# Patient Record
Sex: Male | Born: 1955 | Race: Black or African American | Hispanic: No | Smoking: Former smoker
Health system: Southern US, Community
[De-identification: ages and names within clinical notes are randomized; demographics above are authoritative.]

## PROBLEM LIST (undated history)

## (undated) DIAGNOSIS — G909 Disorder of the autonomic nervous system, unspecified: Secondary | ICD-10-CM

## (undated) DIAGNOSIS — R809 Proteinuria, unspecified: Secondary | ICD-10-CM

## (undated) DIAGNOSIS — E663 Overweight: Secondary | ICD-10-CM

## (undated) DIAGNOSIS — E119 Type 2 diabetes mellitus without complications: Secondary | ICD-10-CM

## (undated) DIAGNOSIS — N529 Male erectile dysfunction, unspecified: Secondary | ICD-10-CM

## (undated) DIAGNOSIS — C61 Malignant neoplasm of prostate: Secondary | ICD-10-CM

## (undated) DIAGNOSIS — D869 Sarcoidosis, unspecified: Secondary | ICD-10-CM

## (undated) DIAGNOSIS — R972 Elevated prostate specific antigen [PSA]: Secondary | ICD-10-CM

## (undated) DIAGNOSIS — R059 Cough, unspecified: Secondary | ICD-10-CM

## (undated) DIAGNOSIS — J040 Acute laryngitis: Secondary | ICD-10-CM

## (undated) DIAGNOSIS — G4730 Sleep apnea, unspecified: Secondary | ICD-10-CM

## (undated) DIAGNOSIS — L219 Seborrheic dermatitis, unspecified: Secondary | ICD-10-CM

## (undated) DIAGNOSIS — E559 Vitamin D deficiency, unspecified: Secondary | ICD-10-CM

## (undated) DIAGNOSIS — H0019 Chalazion unspecified eye, unspecified eyelid: Secondary | ICD-10-CM

## (undated) DIAGNOSIS — I872 Venous insufficiency (chronic) (peripheral): Secondary | ICD-10-CM

## (undated) DIAGNOSIS — B353 Tinea pedis: Secondary | ICD-10-CM

## (undated) DIAGNOSIS — I10 Essential (primary) hypertension: Secondary | ICD-10-CM

## (undated) DIAGNOSIS — J309 Allergic rhinitis, unspecified: Secondary | ICD-10-CM

## (undated) DIAGNOSIS — E785 Hyperlipidemia, unspecified: Secondary | ICD-10-CM

## (undated) DIAGNOSIS — R05 Cough: Secondary | ICD-10-CM

## (undated) HISTORY — DX: Allergic rhinitis, unspecified: J30.9

## (undated) HISTORY — DX: Seborrheic dermatitis, unspecified: L21.9

## (undated) HISTORY — PX: WISDOM TOOTH EXTRACTION: SHX21

## (undated) HISTORY — DX: Sarcoidosis, unspecified: D86.9

## (undated) HISTORY — DX: Sleep apnea, unspecified: G47.30

## (undated) HISTORY — DX: Acute laryngitis: J04.0

## (undated) HISTORY — DX: Venous insufficiency (chronic) (peripheral): I87.2

## (undated) HISTORY — DX: Type 2 diabetes mellitus without complications: E11.9

## (undated) HISTORY — PX: CATARACT EXTRACTION: SUR2

## (undated) HISTORY — PX: ROTATOR CUFF REPAIR: SHX139

## (undated) HISTORY — PX: CIRCUMCISION: SUR203

## (undated) HISTORY — DX: Malignant neoplasm of prostate: C61

## (undated) HISTORY — DX: Male erectile dysfunction, unspecified: N52.9

## (undated) HISTORY — DX: Vitamin D deficiency, unspecified: E55.9

## (undated) HISTORY — DX: Cough: R05

## (undated) HISTORY — PX: TONSILLECTOMY AND ADENOIDECTOMY: SUR1326

## (undated) HISTORY — DX: Disorder of the autonomic nervous system, unspecified: G90.9

## (undated) HISTORY — DX: Tinea pedis: B35.3

## (undated) HISTORY — DX: Hyperlipidemia, unspecified: E78.5

## (undated) HISTORY — DX: Elevated prostate specific antigen (PSA): R97.20

## (undated) HISTORY — DX: Cough, unspecified: R05.9

## (undated) HISTORY — DX: Proteinuria, unspecified: R80.9

## (undated) HISTORY — DX: Overweight: E66.3

## (undated) HISTORY — DX: Chalazion unspecified eye, unspecified eyelid: H00.19

## (undated) HISTORY — DX: Essential (primary) hypertension: I10

---
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
## 2007-03-08 ENCOUNTER — Inpatient Hospital Stay (HOSPITAL_COMMUNITY): Admission: EM | Admit: 2007-03-08 | Discharge: 2007-03-10 | Payer: Self-pay | Admitting: Emergency Medicine

## 2007-04-20 ENCOUNTER — Encounter: Admission: RE | Admit: 2007-04-20 | Discharge: 2007-04-20 | Payer: Self-pay | Admitting: Internal Medicine

## 2008-03-13 DIAGNOSIS — G4730 Sleep apnea, unspecified: Secondary | ICD-10-CM | POA: Insufficient documentation

## 2008-03-13 DIAGNOSIS — G4733 Obstructive sleep apnea (adult) (pediatric): Secondary | ICD-10-CM | POA: Insufficient documentation

## 2008-03-13 DIAGNOSIS — I872 Venous insufficiency (chronic) (peripheral): Secondary | ICD-10-CM | POA: Insufficient documentation

## 2008-07-03 ENCOUNTER — Emergency Department (HOSPITAL_COMMUNITY): Admission: EM | Admit: 2008-07-03 | Discharge: 2008-07-03 | Payer: Self-pay | Admitting: Emergency Medicine

## 2009-11-27 ENCOUNTER — Ambulatory Visit: Payer: Self-pay | Admitting: Family Medicine

## 2011-03-29 NOTE — Discharge Summary (Signed)
Johnny Maddox, Johnny Maddox                  ACCOUNT NO.:  [id number]   MEDICAL RECORD NO.:  [id number]          PATIENT TYPE:  INP   LOCATION:  1225                         FACILITY:  [facility]   PHYSICIAN:  Lonia Blood, M.D.      DATE OF BIRTH:  08-05-1956   DATE OF ADMISSION:  03/08/2007  DATE OF DISCHARGE:  03/10/2007                               DISCHARGE SUMMARY   PRIMARY CARE PHYSICIAN:  The patient was unassigned to [place] but he will  follow up with Dr. Mikeal Hawthorne in a week.   DISCHARGE DIAGNOSES:  1. Newly diagnosed diabetes with hyperosmolar nonketotic state.  2. Hypertension.  3. Dyslipidemia.  4. Transient thrombocytopenia.  5. Obesity.   DISCHARGE MEDICATIONS:  1. Norvasc 10 mg daily.  2. Allegra 180 mg daily.  3. Geritol one tablet daily.  4. Hydrochlorothiazide 25 mg daily.  5. Lipitor 2 mg daily.  6. Amaryl 4 mg daily.  7. Lentus insulin 24 units subcutaneously q.h.s.   DISPOSITION:  The patient is being discharged home to follow up with Dr.  Mikeal Hawthorne.  Further treatment and management will be given in the office.  He will be set up with outpatient diabetic management clinic and  training.   PROCEDURES PERFORMED:  None.   CONSULTATIONS:  None.   BRIEF HISTORY AND PHYSICAL:  Please refer to dictated history and  physical by Dr. Della Goo.  In short however, this is 55 year old  male that presented to the emergency room complaining of being sick for  2 weeks.  He was having dry mouth, thirst, increased urination and  frequency.  He has also has lost some weight but with increasing  appetite.  The patient was seen in the ER to be dehydrated.  His blood  sugar was elevated to at 717.  Urinalysis showed glycosuria as well.  He  was subsequently admitted for further management.   HOSPITAL COURSE:  #1 - NEWLY-DIAGNOSED DIABETES.  The patient had no  prior history of diabetes until this admission.  With a sugar of over  700, he was admitted to the ICU and diagnosed with  hyperosmolar  nonketotic state.  He was hydrated mass with lots of IV fluids and IV  insulin.  Later, after the Glucommander was discontinued, he was placed  on Lantus insulin 24 units at night with Amaryl during the day.  The  patient's blood sugar seems to have stabilized and will see him as an  outpatient and adjust his sugars accordingly.   #2 - HYPERTENSION.  This was also controlled with his home medications  which we continued in the hospital.   #3 - DYSLIPIDEMIA.  His fasting lipid panel in the hospital was  minimally elevated.  We will continue with his stain while in the  hospital.   #4 - THROMBOCYTOPENIA.  The patient's platelet dropped as low as 116.  The screen for HIT was negative.  His platelet later bounced back and  appears to be secondary to acute illness.   #5 - MORBID OBESITY.  The patient was counseled again during this  hospitalization.   #  6 - DEHYDRATION.  Again, this was secondary to his hyperglycemia which  was corrected promptly with IV fluids.      Lonia Blood, M.D.  Electronically Signed     LG/MEDQ  D:  03/23/2007  T:  03/23/2007  Job:  [id number]

## 2011-03-29 NOTE — H&P (Signed)
Johnny Maddox, Johnny Maddox                  ACCOUNT NO.:  [id number]   MEDICAL RECORD NO.:  [id number]          PATIENT TYPE:  INP   LOCATION:  0102                         FACILITY:  [facility]   PHYSICIAN:  Della Goo, M.D. DATE OF BIRTH:  21-Jun-1956   DATE OF ADMISSION:  03/08/2007  DATE OF DISCHARGE:                              HISTORY & PHYSICAL   This is an unassigned patient.   CHIEF COMPLAINT:  Sickness x2 weeks.   HISTORY OF PRESENT ILLNESS:  This is a 55 year old male who presented to  the emergency department secondary to complaints of being sick for 2  weeks, with symptoms of dry mouth, thirst, increased urination, and  frequency.  The patient reports losing weight but having an increased  appetite as well.  He reports having a 20-pound weight loss over the  past 2 weeks.  He denies having any fevers, chills, shortness of breath,  chest pain, nausea, or vomiting.  He does report having right upper  quadrant abdominal pain at this time.   PAST MEDICAL HISTORY:  1. Hyperlipidemia.  2. Hypertension.  3. Allergies.   MEDICATIONS:  1. Lipitor 10 mg 1 p.o. daily.  2. Hydrochlorothiazide 25 mg 1 p.o. daily.  3. Fexofenadine 180 mg, 1 p.o. daily.  4. Amlodipine 10 mg 1 p.o. daily.   PAST SURGICAL HISTORY:  Rotator cuff repair of the left shoulder.   ALLERGIES:  NO KNOWN DRUG ALLERGIES.   SOCIAL HISTORY:  The patient works as a Careers information officer. He is a nonsmoker,  and reports rare alcohol usage.   FAMILY HISTORY:  Mother has diabetes. Strong family history of  hypertension, and no history of coronary artery disease in his family  that he knows of. The patient does report having a brother with lung  cancer who was a smoker.   REVIEW OF SYSTEMS:  Pertinents are mentioned above.   PHYSICAL EXAMINATION FINDINGS:  GENERAL:  This is in a morbidly obese 31-  year-old male in discomfort but no acute distress.  VITAL SIGNS: Temperature 98.6, blood pressure 132/86, heart rate 86,  respirations 16, O2 saturations 96%.  HEENT: Normocephalic, atraumatic.  There is no scleral icterus.  Pupils  are equally round, reactive to light.  Extraocular muscles are intact.  Funduscopic benign. Oropharynx:  Dry oral mucosa.  NECK:  Supple.  Full range of motion.  No thyromegaly, adenopathy, or  jugular venous distension.  CARDIOVASCULAR:  Regular rate and rhythm.  LUNGS:  Clear to auscultation bilaterally.  ABDOMEN:  Positive bowel sounds, soft, nontender, nondistended.  EXTREMITIES: Without cyanosis, clubbing, or edema.  GENITOURINARY:  Positive balanitis present. Uncircumcised male.  Normal  male genitalia.  NEUROLOGIC:  Alert and oriented x3.  Nonfocal.   LABORATORY STUDIES:  White blood cell count 6.3, hemoglobin 13.9,  hematocrit 41.6, platelets 147, neutrophils 70, lymphocytes 23, MCV  82.4, neutrophils 70%, lymphocytes 23%.  Sodium 132, potassium 4.2,  chloride 98, CO2 23, BUN 14, creatinine 1.3, glucose 717.  Urinalysis  with greater than 1000 mg/dL of glucose, negative hemoglobin, trace  ketones.  Albumin 4.1, AST 26, ALT 27,  alkaline phosphatase 58, total  bilirubin 0.9, lipase 42.   ASSESSMENT:  A 55 year old male being admitted with new onset of type 2  diabetes mellitus.   ADMITTING DIAGNOSES:  1. Hyperosmolar nonketotic hyperglycemia, new onset diabetes mellitus      type 2.  2. Hypertension.  3. Mild hyponatremia.  4. Dehydration.  5. Hyperlipidemia.  6. Hypertension.  7. Obesity.  8. Balanitis.   PLAN:  The patient has been admitted to a step-down unit and has been  placed on the IV insulin drip protocol.  He has also been placed on IV  fluids for rehydration therapy.  His electrolytes will be corrected  p.r.n..  The patient is n.p.o. while he is on the IV insulin drip.  Diabetes education will be needed for this patient.  DVT and GI  prophylaxis have been ordered.  The patient's p.o. medications have been  held for now.  Nystatin Cream has been  ordered for the balanitis.      Della Goo, M.D.  Electronically Signed     HJ/MEDQ  D:  03/09/2007  T:  03/09/2007  Job:  [id number]

## 2012-09-17 ENCOUNTER — Other Ambulatory Visit: Payer: Self-pay | Admitting: Family Medicine

## 2012-09-17 NOTE — Telephone Encounter (Signed)
Chart pulled to PA pool at nurses station  DOS 02/01/11

## 2012-10-24 ENCOUNTER — Ambulatory Visit (INDEPENDENT_AMBULATORY_CARE_PROVIDER_SITE_OTHER): Payer: 59 | Admitting: Physician Assistant

## 2012-10-24 VITALS — BP 130/94 | HR 94 | Temp 97.9°F | Resp 18 | Ht 69.25 in | Wt 293.2 lb

## 2012-10-24 DIAGNOSIS — E119 Type 2 diabetes mellitus without complications: Secondary | ICD-10-CM | POA: Insufficient documentation

## 2012-10-24 DIAGNOSIS — L0291 Cutaneous abscess, unspecified: Secondary | ICD-10-CM

## 2012-10-24 DIAGNOSIS — L0390 Cellulitis, unspecified: Secondary | ICD-10-CM

## 2012-10-24 DIAGNOSIS — N4889 Other specified disorders of penis: Secondary | ICD-10-CM

## 2012-10-24 DIAGNOSIS — N489 Disorder of penis, unspecified: Secondary | ICD-10-CM

## 2012-10-24 MED ORDER — DOXYCYCLINE HYCLATE 100 MG PO CAPS: 100.0000 mg | ORAL_CAPSULE | Freq: Two times a day (BID) | ORAL | Status: DC

## 2012-10-24 MED ORDER — NYSTATIN 100000 UNIT/GM EX POWD: Freq: Four times a day (QID) | CUTANEOUS | Status: DC
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
## 2012-10-26 ENCOUNTER — Telehealth: Payer: Self-pay | Admitting: Radiology

## 2012-10-26 LAB — WOUND CULTURE
Gram Stain: NONE SEEN
Gram Stain: NONE SEEN
Gram Stain: NONE SEEN
Organism ID, Bacteria: NO GROWTH

## 2012-10-26 NOTE — Telephone Encounter (Signed)
Called [facility] about this patients request for HCTZ 25mg ,, who has been filling for him? [facility] advised not sure/ Rx was filled in [place], is he your patient?

## 2012-10-28 ENCOUNTER — Ambulatory Visit (INDEPENDENT_AMBULATORY_CARE_PROVIDER_SITE_OTHER): Payer: 59 | Admitting: Family Medicine

## 2012-10-28 VITALS — BP 122/84 | HR 87 | Temp 98.1°F | Resp 18 | Ht 69.0 in | Wt 291.0 lb

## 2012-10-28 DIAGNOSIS — H5710 Ocular pain, unspecified eye: Secondary | ICD-10-CM

## 2012-10-28 NOTE — Progress Notes (Signed)
Subjective:    Patient ID: Johnny Maddox, male    DOB: 09/12/1956, 56 y.o.   MRN: [id number]  HPI This 56 y.o. male presents for evaluation of RIGHT eye pain and watering. Symptoms woke him from sleep at 2 am with the sensation that he had something in his eye.  He rubbed it vigorously, and it seemed to resolve, but then recurred at about 4 am and persists now.  He flushed it copiously at home without finding a FB. It hurts to open and close the eye.  No history of FB to the eye.   Past Medical History  Diagnosis Date  . Diabetes mellitus without complication     Past Surgical History  Procedure Date  . Rotator cuff repair R 2011, L 2006    L and R shoulder    Prior to Admission medications   Medication Sig Start Date End Date Taking? Authorizing Provider  doxycycline (VIBRAMYCIN) 100 MG capsule Take 1 capsule (100 mg total) by mouth 2 (two) times daily. 10/24/12  Yes Stellan Vick S Jayle Solarz, PA-C  insulin detemir (LEVEMIR) 100 UNIT/ML injection Inject 24 Units into the skin 2 (two) times daily.   Yes Historical Provider, MD  metFORMIN (GLUCOPHAGE) 1000 MG tablet Take 1,000 mg by mouth 2 (two) times daily with a meal.   Yes Historical Provider, MD  nystatin (MYCOSTATIN) powder Apply topically 4 (four) times daily. 10/24/12  Yes Dulcey Riederer S Deania Siguenza, PA-C    No Known Allergies  History   Social History  . Marital Status: Single    Spouse Name: n/a    Number of Children: 2  . Years of Education: 18   Occupational History  . 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 Smoking status: Never Smoker   . Smokeless tobacco: Never Used  . Alcohol Use: 0.0 - 0.6 oz/week    0-1 Cans of beer per week     Comment: paranoia about alcholism; alcohol makes me sleepy.  . Drug Use: No  . Sexually Active: Yes -- Male partner(s)    Birth Control/ Protection: Condom   Other Topics Concern  . Not on file   Social History Narrative   Lives alone. Adult son is in the [employer].   His daughter lives with her mother (his ex-wife).    Family History  Problem Relation Age of Onset  . Cancer Mother     leukemia  . Alcohol abuse Father     Review of Systems As above.    Objective:   Physical Exam  BP 122/84  Pulse 87  Temp 98.1 F (36.7 C) (Oral)  Resp 18  Ht 5\' 9"  (1.753 m)  Wt 291 lb (131.997 kg)  BMI 42.97 kg/m2  SpO2 99%  Visual Acuity Screening   Right eye Left eye Both eyes  Without correction:     With correction: 20/50 20/25 20/30    WDWNBM A&O x 3.  Obviously uncomfortable.  Mild swelling of the eyelid just below the brow on the right.  Conjunctiva with MINIMAL injection.  No edema.  No erythema.  The anterior chamber is clear.  Funduscopic exam is normal.  Local anesthesia with 2 gtts of proparacaine, stained with florescein; no areas of increased stain uptake.  Upper lid inverted-no FB seen.  Eye then irrigated with saline.      Assessment & Plan:   1. Eye pain  Ambulatory referral to Ophthalmology   The patient will be seen as  a work-in with Dr. Abel Presto at 10:15 this morning.  He will go directly there upon leaving our office.  He will then need follow-up with his PCP Nilda Simmer, MD) for his chronic issues.  Our scheduling staff will call him to schedule that.  Discussed with Dr. Katrinka Blazing.

## 2012-10-28 NOTE — Patient Instructions (Addendum)
You can go today for your appt with the eye doctor at 10:15 this is with Dr Ward Givens.

## 2012-10-30 LAB — HERPES CULTURE, RAPID: Organism ID, Bacteria: NEGATIVE

## 2012-11-05 NOTE — Progress Notes (Signed)
Appt made for Jan 15 with Dr. Katrinka Blazing for meds. Johnny Maddox

## 2012-11-25 ENCOUNTER — Ambulatory Visit: Payer: 59 | Admitting: Family Medicine

## 2012-12-23 ENCOUNTER — Other Ambulatory Visit: Payer: Self-pay | Admitting: Family Medicine

## 2012-12-23 NOTE — Telephone Encounter (Signed)
Please pull paper chart. Medication not on med list in epic.

## 2012-12-25 NOTE — Telephone Encounter (Signed)
Chart pulled to PA pool at nurses station DOS 02/01/11. Please return to med rec for appt on 12/28/12.

## 2012-12-28 ENCOUNTER — Encounter: Payer: Self-pay | Admitting: Family Medicine

## 2012-12-28 ENCOUNTER — Ambulatory Visit (INDEPENDENT_AMBULATORY_CARE_PROVIDER_SITE_OTHER): Payer: 59 | Admitting: Family Medicine

## 2012-12-28 VITALS — BP 113/69 | HR 89 | Temp 97.9°F | Resp 18 | Ht 69.0 in | Wt 296.0 lb

## 2012-12-28 DIAGNOSIS — E119 Type 2 diabetes mellitus without complications: Secondary | ICD-10-CM

## 2012-12-28 DIAGNOSIS — E7800 Pure hypercholesterolemia, unspecified: Secondary | ICD-10-CM

## 2012-12-28 DIAGNOSIS — I10 Essential (primary) hypertension: Secondary | ICD-10-CM

## 2012-12-28 DIAGNOSIS — N481 Balanitis: Secondary | ICD-10-CM

## 2012-12-28 DIAGNOSIS — N476 Balanoposthitis: Secondary | ICD-10-CM

## 2012-12-28 LAB — COMPREHENSIVE METABOLIC PANEL
ALT: 29 U/L (ref 0–53)
AST: 21 U/L (ref 0–37)
Albumin: 4.4 g/dL (ref 3.5–5.2)
Alkaline Phosphatase: 43 U/L (ref 39–117)
BUN: 13 mg/dL (ref 6–23)
CO2: 25 mEq/L (ref 19–32)
Calcium: 9.8 mg/dL (ref 8.4–10.5)
Chloride: 105 mEq/L (ref 96–112)
Creat: 0.99 mg/dL (ref 0.50–1.35)
Glucose, Bld: 181 mg/dL — ABNORMAL HIGH (ref 70–99)
Potassium: 4.3 mEq/L (ref 3.5–5.3)
Sodium: 138 mEq/L (ref 135–145)
Total Bilirubin: 0.4 mg/dL (ref 0.3–1.2)
Total Protein: 6.2 g/dL (ref 6.0–8.3)

## 2012-12-28 LAB — LIPID PANEL
Cholesterol: 183 mg/dL (ref 0–200)
HDL: 41 mg/dL (ref >39)
LDL Cholesterol: 116 mg/dL — ABNORMAL HIGH (ref 0–99)
Total CHOL/HDL Ratio: 4.5 Ratio
Triglycerides: 129 mg/dL (ref <150)
VLDL: 26 mg/dL (ref 0–40)

## 2012-12-28 LAB — HEMOGLOBIN A1C
Hgb A1c MFr Bld: 10.3 % — ABNORMAL HIGH (ref <5.7)
Mean Plasma Glucose: 249 mg/dL — ABNORMAL HIGH (ref <117)

## 2012-12-28 LAB — CK: Total CK: 310 U/L — ABNORMAL HIGH (ref 7–232)

## 2012-12-28 MED ORDER — LISINOPRIL 10 MG PO TABS: 20.0000 mg | ORAL_TABLET | Freq: Every day | ORAL | Status: DC

## 2012-12-28 MED ORDER — NYSTATIN 100000 UNIT/GM EX CREA: TOPICAL_CREAM | Freq: Two times a day (BID) | CUTANEOUS | Status: DC

## 2012-12-28 MED ORDER — HYDROCHLOROTHIAZIDE 25 MG PO TABS: 25.0000 mg | ORAL_TABLET | Freq: Every day | ORAL | Status: DC

## 2012-12-28 MED ORDER — AMLODIPINE BESYLATE 10 MG PO TABS: 5.0000 mg | ORAL_TABLET | Freq: Every day | ORAL | Status: DC

## 2012-12-28 NOTE — Progress Notes (Signed)
[address]   [place], [place]  [address]   [phone]  Subjective:    Patient ID: Johnny Maddox, male    DOB: 1956/06/18, 57 y.o.   MRN: [id number]  HPI This 57 y.o. male presents to establish care and for nine month follow-up:  1.  HTN:  Not checking blood pressure at home or work.  No chest pain, palpitations, shortness of breath, +swelling chronic.     2.  Hyperlipidemia: taking statin; ate breakfast; had sausage.  Reports compliance with medication; good tolerance to medication; good symptom control.    3.  DMII:  S/p endocrinology visit; last visit 3 weeks ago; frustrated by lack of weight loss.  A1c 11.0.  [facility].  Levemir and Metformin.    4.  Stress reaction: divorced; purchased new home in [place].  Moved in one week ago.  Daughter lives ten miles away; does not get along with ex-wife. Still must drop off at [facility] on [address].  Stress eats; gaining weight.  Bought new truck.  Paying Alimony, paying child support, paying private school for daughter.    Daughter expected Valentine's gift.  Dealing with stressors.    5. Penile rash: coating on penis; washes every day; circumcised.  White stuff; worried about fungus.  Duration two weeks.  Mild itching.  No medications; just washing with dial and water.  Washing bid.  In morning, scared.  Unknown.  No new sexual partners.  Last sexual encounter 2 months ago.  Recent STD screening negative 2 months ago.  Initially worried about STD; all negative.   6.  Rash: no improvement in rash; no improvement.  Diagnosed with fungal etiology.  Tired of taking all of these medications.  One toe is clearing up; also using topical cream; lost it and has restarted it.  Follow-up with dermatology in four weeks.   Review of Systems  Constitutional: Negative for fever, chills, diaphoresis and fatigue.  Respiratory: Negative for cough, shortness of breath and stridor.   Cardiovascular: Negative for chest pain, palpitations and leg  swelling.  Genitourinary: Negative for urgency, hematuria, decreased urine volume, discharge, penile swelling, scrotal swelling, genital sores, penile pain and testicular pain.  Skin: Positive for rash. Negative for color change, pallor and wound.  Neurological: Negative for dizziness, tremors, seizures, syncope, facial asymmetry, speech difficulty, weakness, light-headedness, numbness and headaches.  Psychiatric/Behavioral: Negative for dysphoric mood. The patient is nervous/anxious.         Past Medical History  Diagnosis Date  . Diabetes mellitus without complication   . Hyperlipidemia   . Hypertension     Past Surgical History  Procedure Laterality Date  . Rotator cuff repair  R 2011, L 2006    L and R shoulder    Prior to Admission medications   Medication Sig Start Date End Date Taking? Authorizing Provider  amLODipine (NORVASC) 10 MG tablet Take 10 mg by mouth daily.   Yes Historical Provider, MD  atorvastatin (LIPITOR) 20 MG tablet TAKE ONE TABLET BY MOUTH NIGHTLY AT BEDTIME 12/23/12  Yes Sarah Harvie Bridge, PA-C  hydrochlorothiazide (HYDRODIURIL) 25 MG tablet Take 1 tablet (25 mg total) by mouth daily. 12/28/12  Yes Ethelda Chick, MD  insulin detemir (LEVEMIR) 100 UNIT/ML injection Inject 24 Units into the skin 2 (two) times daily.   Yes Historical Provider, MD  lisinopril (PRINIVIL,ZESTRIL) 10 MG tablet Take 10 mg by mouth daily.   Yes Historical Provider, MD  metFORMIN (GLUCOPHAGE) 1000 MG tablet Take 1,000 mg by mouth  2 (two) times daily with a meal.   Yes Historical Provider, MD  doxycycline (VIBRAMYCIN) 100 MG capsule Take 1 capsule (100 mg total) by mouth 2 (two) times daily. 10/24/12   Chelle S Jeffery, PA-C  nystatin cream (MYCOSTATIN) Apply topically 2 (two) times daily. 12/28/12   Ethelda Chick, MD    No Known Allergies  History   Social History  . Marital Status: Single    Spouse Name: n/a    Number of Children: 2  . Years of Education: 18   Occupational  History  . 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 Smoking status: Never Smoker   . Smokeless tobacco: Never Used  . Alcohol Use: 0 - .6 oz/week    0-1 Cans of beer per week     Comment: paranoia about alcholism; alcohol makes me sleepy.  . Drug Use: No  . Sexually Active: Yes -- Male partner(s)    Birth Control/ Protection: Condom   Other Topics Concern  . Not on file   Social History Narrative   Lives alone. Adult son is in the [employer].  His daughter lives with her mother (his ex-wife).    Family History  Problem Relation Age of Onset  . Cancer Mother     leukemia  . Alcohol abuse Father     Objective:   Physical Exam  Nursing note and vitals reviewed. Constitutional: He is oriented to person, place, and time. He appears well-developed and well-nourished. No distress.  Eyes: Conjunctivae are normal. Pupils are equal, round, and reactive to light.  Neck: Normal range of motion. Neck supple. No thyromegaly present.  Cardiovascular: Normal rate, regular rhythm, normal heart sounds and intact distal pulses.  Exam reveals no gallop and no friction rub.   No murmur heard. Trace LE pitting edema lower calves B.  Pulmonary/Chest: Effort normal and breath sounds normal. He has no wheezes. He has no rales.  Abdominal: Soft. Bowel sounds are normal. There is no tenderness. There is no rebound and no guarding.  Genitourinary: Testes normal and penis normal. Right testis shows no mass, no swelling and no tenderness. Right testis is descended. Left testis shows no mass, no swelling and no tenderness. No penile erythema or penile tenderness.  White scant discharge under foreskin; no erythema, pustules, vesicles.  Lymphadenopathy:    He has no cervical adenopathy.  Neurological: He is alert and oriented to person, place, and time. No cranial nerve deficit. He exhibits normal muscle tone. Coordination normal.  Skin: He is not diaphoretic.  Psychiatric: He  has a normal mood and affect. His behavior is normal. Judgment and thought content normal.       Assessment & Plan:  Pure hypercholesterolemia - Plan: Lipid panel  Essential hypertension, benign - Plan: CBC with Differential, CK, Comprehensive metabolic panel  Type II or unspecified type diabetes mellitus without mention of complication, not stated as uncontrolled - Plan: Comprehensive metabolic panel, Hemoglobin A1c  Balanitis - Plan: nystatin cream (MYCOSTATIN)    1.  Hypercholesterolemia:  Controlled; obtain labs; continue current medications. 2.  HTN: controlled; decrease Amlodipine to 10mg  1/2 tablet daily; increase Lisinopril 10mg  to two tablets daily; continue HCTZ 25mg  daily; obtain labs.  Decreasing Amlodipine to 5mg  daily should improve trace LE edema. 3.  DMII: uncontrolled; recent follow-up with endocrinology; non-compliance with diet, exercise.  Recent HgbA1c of 11. 0 per pt report.  Meds ordered this encounter  Medications  . DISCONTD: hydrochlorothiazide (  HYDRODIURIL) 25 MG tablet    Sig: Take 25 mg by mouth daily.  Marland Kitchen DISCONTD: lisinopril (PRINIVIL,ZESTRIL) 10 MG tablet    Sig: Take 10 mg by mouth daily.  Marland Kitchen DISCONTD: amLODipine (NORVASC) 10 MG tablet    Sig: Take 10 mg by mouth daily.  Marland Kitchen nystatin cream (MYCOSTATIN)    Sig: Apply topically 2 (two) times daily.    Dispense:  30 g    Refill:  1  . hydrochlorothiazide (HYDRODIURIL) 25 MG tablet    Sig: Take 1 tablet (25 mg total) by mouth daily.    Dispense:  90 tablet    Refill:  1  . lisinopril (PRINIVIL,ZESTRIL) 10 MG tablet    Sig: Take 2 tablets (20 mg total) by mouth daily.  Marland Kitchen amLODipine (NORVASC) 10 MG tablet    Sig: Take 0.5 tablets (5 mg total) by mouth daily.    4.  Balanitis Candidal:  New.  Recurrent issue; rx for Nystatin cream bid for two weeks. Improved glycemic control will prevent recurrence; pt expressed understanding.

## 2012-12-28 NOTE — Patient Instructions (Addendum)
Pure hypercholesterolemia - Plan: Lipid panel  Essential hypertension, benign - Plan: CBC with Differential, CK, Comprehensive metabolic panel  Type II or unspecified type diabetes mellitus without mention of complication, not stated as uncontrolled - Plan: Comprehensive metabolic panel, Hemoglobin A1c  Balanitis - Plan: nystatin cream (MYCOSTATIN)

## 2012-12-29 LAB — CBC WITH DIFFERENTIAL/PLATELET
Basophils Absolute: 0 10*3/uL (ref 0.0–0.1)
Basophils Relative: 0 % (ref 0–1)
Eosinophils Absolute: 0.1 10*3/uL (ref 0.0–0.7)
Eosinophils Relative: 2 % (ref 0–5)
HCT: 42.7 % (ref 39.0–52.0)
Hemoglobin: 14.1 g/dL (ref 13.0–17.0)
Lymphocytes Relative: 28 % (ref 12–46)
Lymphs Abs: 1.5 10*3/uL (ref 0.7–4.0)
MCH: 27 pg (ref 26.0–34.0)
MCHC: 33 g/dL (ref 30.0–36.0)
MCV: 81.6 fL (ref 78.0–100.0)
Monocytes Absolute: 0.4 10*3/uL (ref 0.1–1.0)
Monocytes Relative: 8 % (ref 3–12)
Neutro Abs: 3.4 10*3/uL (ref 1.7–7.7)
Neutrophils Relative %: 62 % (ref 43–77)
Platelets: 152 10*3/uL (ref 150–400)
RBC: 5.23 MIL/uL (ref 4.22–5.81)
RDW: 14.9 % (ref 11.5–15.5)
WBC: 5.5 10*3/uL (ref 4.0–10.5)

## 2013-01-18 ENCOUNTER — Other Ambulatory Visit: Payer: Self-pay | Admitting: Physician Assistant

## 2013-01-21 ENCOUNTER — Encounter: Payer: Self-pay | Admitting: *Deleted

## 2013-03-17 ENCOUNTER — Other Ambulatory Visit: Payer: Self-pay | Admitting: Family Medicine

## 2013-06-08 ENCOUNTER — Encounter: Payer: Self-pay | Admitting: Cardiovascular Disease

## 2013-06-08 ENCOUNTER — Ambulatory Visit (INDEPENDENT_AMBULATORY_CARE_PROVIDER_SITE_OTHER): Payer: 59 | Admitting: Cardiovascular Disease

## 2013-06-08 VITALS — BP 110/86 | HR 91 | Ht 69.0 in | Wt 276.8 lb

## 2013-06-08 DIAGNOSIS — R059 Cough, unspecified: Secondary | ICD-10-CM

## 2013-06-08 DIAGNOSIS — R55 Syncope and collapse: Secondary | ICD-10-CM | POA: Insufficient documentation

## 2013-06-08 DIAGNOSIS — R054 Cough syncope: Secondary | ICD-10-CM | POA: Insufficient documentation

## 2013-06-08 DIAGNOSIS — I10 Essential (primary) hypertension: Secondary | ICD-10-CM

## 2013-06-08 DIAGNOSIS — R05 Cough: Secondary | ICD-10-CM

## 2013-06-08 NOTE — Progress Notes (Signed)
Johnny Maddox Date of Birth  June 25, 1956       [facility] Office [address], [address]  [address] [place], [place]  [address]   [place], [place]  [address] [phone]     [phone]   Fax  [phone]    Fax [phone]  Problem List: 1. Cough syncope 2.  Diabetes mellitus 3. Hypertension   History of Present Illness:  Johnny Maddox is a 57 yo who is referred for further evaluation after having 2 episodes of syncope.  Johnny Maddox is a 57 year old gentleman who reports having 2 episodes of syncope. Both episodes were associated with a very severe coughing spell.  He's never had any episodes of syncope or not associated with cough.  These coughs have typically been associated with a bad cold. He also has some coughing when he has problems with seasonal allergies.  He exercises several times a week.  His glucose levels have been well controlled.  He still eats some additional salt. He tries to avoid salt but still eats occasional bacon or sausage in the morning.  Current Outpatient Prescriptions on File Prior to Visit  Medication Sig Dispense Refill  . amLODipine (NORVASC) 10 MG tablet TAKE ONE TABLET BY MOUTH ONE TIME DAILY  30 tablet  2  . aspirin 81 MG tablet Take 81 mg by mouth 3 (three) times daily.       Marland Kitchen atorvastatin (LIPITOR) 20 MG tablet TAKE ONE TABLET BY MOUTH NIGHTLY AT BEDTIME  30 tablet  2  . metFORMIN (GLUCOPHAGE) 1000 MG tablet Take 1,000 mg by mouth 2 (two) times daily with a meal.      . Multiple Vitamins-Minerals (MULTIVITAMIN PO) Take by mouth daily.       No current facility-administered medications on file prior to visit.    No Known Allergies  Past Medical History  Diagnosis Date  . Diabetes mellitus without complication   . Hyperlipidemia   . Hypertension   . Acute laryngitis, without mention of obstruction   . Unspecified disorder of autonomic nervous system   . Unspecified vitamin D deficiency   . Chalazion     . Cough   . Allergic rhinitis, cause unspecified   . Unspecified venous (peripheral) insufficiency   . Dermatophytosis of foot   . Seborrheic dermatitis, unspecified   . Unspecified sleep apnea   . Impotence of organic origin     Past Surgical History  Procedure Laterality Date  . Rotator cuff repair  R 2011, L 2006    L and R shoulder  . Tonsillectomy and adenoidectomy      History  Smoking status  . Never Smoker   Smokeless tobacco  . Never Used    History  Alcohol Use  . 0 - .6 oz/week  . 0-1 Cans of beer per week    Comment: paranoia about alcholism; alcohol makes me sleepy.    Family History  Problem Relation Age of Onset  . Cancer Mother     leukemia  . Hypertension Mother   . Alcohol abuse Father   . Hypertension Brother     Reviw of Systems:  Reviewed in the HPI.  All other systems are negative.  Physical Exam: Blood pressure 110/86, pulse 91, height 5\' 9"  (1.753 m), weight 276 lb 12.8 oz (125.556 kg), SpO2 97.00%. General: Well developed, well nourished, in no acute distress.  Head: Normocephalic, atraumatic, sclera non-icteric, mucus membranes are moist,   Neck:  Supple. Carotids are 2 + without bruits. No JVD   Lungs: Clear   Heart: RR, normal S1, S2  Abdomen: Soft, non-tender, non-distended with normal bowel sounds.  Msk:  Strength and tone are normal   Extremities: No clubbing or cyanosis. No edema.  Distal pedal pulses are 2+ and equal    Neuro: CN II - XII intact.  Alert and oriented X 3.   Psych:  Normal   ECG: 04/14/13:  NSR NS ST abn.  ( Primary medical doctor)   Assessment / Plan:

## 2013-06-08 NOTE — Patient Instructions (Addendum)
Your physician has requested that you have an echocardiogram. Echocardiography is a painless test that uses sound waves to create images of your heart. It provides your doctor with information about the size and shape of your heart and how well your heart's chambers and valves are working. This procedure takes approximately one hour. There are no restrictions for this procedure.  Your physician recommends that you schedule a follow-up appointment in: as needed basis  REDUCE HIGH SODIUM FOODS LIKE CANNED SOUP, GRAVY, SAUCES, READY PREPARED FOODS LIKE FROZEN FOODS; LEAN CUISINE, LASAGNA. BACON, SAUSAGE, LUNCH MEAT, FAST FOODS, HOT DOGS, CHIPS, PIZZA.   DASH Diet The DASH diet stands for "Dietary Approaches to Stop Hypertension." It is a healthy eating plan that has been shown to reduce high blood pressure (hypertension) in as little as 14 days, while also possibly providing other significant health benefits. These other health benefits include reducing the risk of breast cancer after menopause and reducing the risk of type 2 diabetes, heart disease, colon cancer, and stroke. Health benefits also include weight loss and slowing kidney failure in patients with chronic kidney disease.  DIET GUIDELINES  Limit salt (sodium). Your diet should contain less than 1500 mg of sodium daily.  Limit refined or processed carbohydrates. Your diet should include mostly whole grains. Desserts and added sugars should be used sparingly.  Include small amounts of heart-healthy fats. These types of fats include nuts, oils, and tub margarine. Limit saturated and trans fats. These fats have been shown to be harmful in the body. CHOOSING FOODS  The following food groups are based on a 2000 calorie diet. See your Registered Dietitian for individual calorie needs. Grains and Grain Products (6 to 8 servings daily)  Eat More Often: Whole-wheat bread, brown rice, whole-grain or wheat pasta, quinoa, popcorn without added fat or  salt (air popped).  Eat Less Often: White bread, white pasta, white rice, cornbread. Vegetables (4 to 5 servings daily)  Eat More Often: Fresh, frozen, and canned vegetables. Vegetables may be raw, steamed, roasted, or grilled with a minimal amount of fat.  Eat Less Often/Avoid: Creamed or fried vegetables. Vegetables in a cheese sauce. Fruit (4 to 5 servings daily)  Eat More Often: All fresh, canned (in natural juice), or frozen fruits. Dried fruits without added sugar. One hundred percent fruit juice ( cup [237 mL] daily).  Eat Less Often: Dried fruits with added sugar. Canned fruit in light or heavy syrup. [employer], Fish, and Poultry (2 servings or less daily. One serving is 3 to 4 oz [85-114 g]).  Eat More Often: Ninety percent or leaner ground beef, tenderloin, sirloin. Round cuts of beef, chicken breast, [place] breast. All fish. Grill, bake, or broil your meat. Nothing should be fried.  Eat Less Often/Avoid: Fatty cuts of meat, [place], or chicken leg, thigh, or wing. Fried cuts of meat or fish. Dairy (2 to 3 servings)  Eat More Often: Low-fat or fat-free milk, low-fat plain or light yogurt, reduced-fat or part-skim cheese.  Eat Less Often/Avoid: Milk (whole, 2%).Whole milk yogurt. Full-fat cheeses. Nuts, Seeds, and Legumes (4 to 5 servings per week)  Eat More Often: All without added salt.  Eat Less Often/Avoid: Salted nuts and seeds, canned beans with added salt. Fats and Sweets (limited)  Eat More Often: Vegetable oils, tub margarines without trans fats, sugar-free gelatin. Mayonnaise and salad dressings.  Eat Less Often/Avoid: Coconut oils, palm oils, butter, stick margarine, cream, half and half, cookies, candy, pie. FOR MORE INFORMATION The Dash Diet  Eating Plan: www.dashdiet.org Document Released: 10/17/2011 Document Revised: 01/20/2012 Document Reviewed: 10/17/2011 [facility] Patient Information 2014 [place], [place].   [facility] Low Glycemic  Diet (Source: [facility], 2006) Low Glycemic Foods (20-49) (Decrease risk of developing heart disease) Breakfast Cereals: All-Bran All-Bran Fruit 'n Oats Fiber One Oatmeal (not instant) Oat bran Fruits and fruit juices: (Limit to 1-2 servings per day) Apples Apricots (fresh & dried) Blackberries Blueberries Cherries Cranberries Peaches Pears Plums Prunes Grapefruit Raspberries Strawberries Tangerine Apple juice Grapefruit juice Tomato juice Beans and legumes (fresh-cooked): Black-eyed peas Butter beans Chick peas Lentils  Green beans Lima beans Kidney beans Navy beans Pinto beans Snow peas Non-starchy vegetables: Asparagus, avocado, broccoli, cabbage, cauliflower, celery, cucumber, greens, lettuce, mushrooms, peppers, tomatoes, okra, onions, spinach, summer squash Grains: Barley Bulgur Rye Wild rice Nuts and oils : Almonds Peanuts Sunflower seeds Hazelnuts Pecans Walnuts Oils that are liquid at room temperature Dairy, fish, meat, soy, and eggs: Milk, skim Lowfat cheese Yogurt, lowfat, fruit sugar sweetened Lean red meat Fish  Skinless chicken & [place] Shellfish Egg whites (up to 3 daily) Soy products  Egg yolks (up to 7 or _____ per week) Moderate Glycemic Foods (50-69) Breakfast Cereals: Bran Buds Bran Chex Just Right Mini-Wheats  Special K Swiss muesli Fruits: Banana (under-ripe) Dates Figs Grapes Kiwi Mango Oranges Raisins Fruit Juices: Cranberry juice Orange juice Beans and legumes: Boston-type baked beans Canned pinto, kidney, or navy beans Green peas Vegetables: Beets Carrots  Sweet potato Yam Corn on the cob Breads: Pita (pocket) bread Oat bran bread Pumpernickel bread Rye bread Wheat bread, high fiber  Grains: Cornmeal Rice, brown Rice, white Couscous Pasta: Macaroni Pizza, cheese Ravioli, meat filled Spaghetti, white  Nuts: Cashews Macadamia Snacks: Chocolate Ice cream, lowfat Muffin Popcorn High Glycemic Foods  (70-100)  Breakfast Cereals: Cheerios Corn Chex Corn Flakes Cream of Wheat Grape Nuts Grape Nut Flakes Grits Nutri-Grain Puffed Rice Puffed Wheat Rice Chex Rice Krispies Shredded Wheat Team Total Fruits: Pineapple Watermelon Banana (over-ripe) Beverages: Sodas, sweet tea, pineapple juice Vegetables: Potato, baked, boiled, fried, mashed Jamaica fries Canned or frozen corn Parsnips Winter squash Breads: Most breads (white and whole grain) Bagels Bread sticks Bread stuffing Kaiser roll Dinner rolls Grains: Rice, instant Tapioca, with milk Candy and most cookies Snacks: Donuts Corn chips Jelly beans Pretzels Pastries

## 2013-06-08 NOTE — Assessment & Plan Note (Addendum)
Johnny Maddox presents with fairly classic cough syncope. He has never had episodes of syncope without coughing. The patient has very severe episodes of coughing which have been led to syncope. We discussed the physiology behind this. It's quite likely that he is having some transient bradycardia and lack of right ventricular filling during these severe coughing episodes.    I don't think he has any significant cardiac anormalities.  . I would like to get an echocardiogram for further evaluation of the heart. He does have a long history of hypertension and I would like to make sure that he doesn't have significant hypertension or severe left ventricular hypertrophy.  If he continues to have coughing episodes he may need to see a pulmonologist. If he has significant abnormalities on his echo, we will see him back for followup visit-otherwise I'll see him on an as-needed basis.

## 2013-06-16 ENCOUNTER — Ambulatory Visit (HOSPITAL_COMMUNITY): Payer: 59 | Attending: Cardiovascular Disease

## 2013-06-16 DIAGNOSIS — E119 Type 2 diabetes mellitus without complications: Secondary | ICD-10-CM | POA: Insufficient documentation

## 2013-06-16 DIAGNOSIS — E785 Hyperlipidemia, unspecified: Secondary | ICD-10-CM | POA: Insufficient documentation

## 2013-06-16 DIAGNOSIS — I10 Essential (primary) hypertension: Secondary | ICD-10-CM | POA: Insufficient documentation

## 2013-06-16 DIAGNOSIS — R55 Syncope and collapse: Secondary | ICD-10-CM

## 2013-06-16 NOTE — Progress Notes (Signed)
Echocardiogram performed.  

## 2013-06-28 ENCOUNTER — Encounter: Payer: 59 | Admitting: Family Medicine

## 2013-09-16 ENCOUNTER — Other Ambulatory Visit: Payer: Self-pay

## 2013-11-26 ENCOUNTER — Telehealth: Payer: Self-pay

## 2013-11-26 NOTE — Telephone Encounter (Signed)
Dr Tamala Julian wrote a script for [place] for this patient while at the [facility] clinic.   He is wanting more.   [facility]   [phone]

## 2013-11-26 NOTE — Telephone Encounter (Signed)
Upon review of chart, looks like he has followed up with [facility] since his visit here in 12/2012; would contact [facility] for refills.  Who is his new PCP?

## 2013-11-26 NOTE — Telephone Encounter (Signed)
Dr Tamala Julian does he need to RTC to see you here? Or contact [place]?

## 2013-11-27 NOTE — Telephone Encounter (Signed)
Pt will rtn to [facility] for his refills.

## 2014-03-12 ENCOUNTER — Other Ambulatory Visit: Payer: Self-pay | Admitting: Physician Assistant

## 2014-04-15 ENCOUNTER — Other Ambulatory Visit: Payer: Self-pay | Admitting: Physician Assistant

## 2014-06-02 DIAGNOSIS — E119 Type 2 diabetes mellitus without complications: Secondary | ICD-10-CM | POA: Insufficient documentation

## 2014-06-02 DIAGNOSIS — E785 Hyperlipidemia, unspecified: Secondary | ICD-10-CM | POA: Insufficient documentation

## 2014-06-04 ENCOUNTER — Other Ambulatory Visit: Payer: Self-pay | Admitting: Physician Assistant

## 2014-07-10 DIAGNOSIS — R809 Proteinuria, unspecified: Secondary | ICD-10-CM | POA: Insufficient documentation

## 2014-07-12 DIAGNOSIS — N529 Male erectile dysfunction, unspecified: Secondary | ICD-10-CM | POA: Insufficient documentation

## 2014-11-09 LAB — HM DIABETES EYE EXAM

## 2014-11-18 ENCOUNTER — Encounter: Payer: Self-pay | Admitting: *Deleted

## 2015-02-14 LAB — MICROALBUMIN, URINE: Microalb, Ur: 14

## 2015-05-08 ENCOUNTER — Ambulatory Visit (INDEPENDENT_AMBULATORY_CARE_PROVIDER_SITE_OTHER): Payer: 59 | Admitting: Urology

## 2015-05-08 ENCOUNTER — Telehealth: Payer: Self-pay | Admitting: Urology

## 2015-05-08 ENCOUNTER — Encounter: Payer: Self-pay | Admitting: Urology

## 2015-05-08 VITALS — BP 152/96 | HR 80 | Resp 18 | Ht 70.0 in | Wt 274.6 lb

## 2015-05-08 DIAGNOSIS — N138 Other obstructive and reflux uropathy: Secondary | ICD-10-CM | POA: Insufficient documentation

## 2015-05-08 DIAGNOSIS — N529 Male erectile dysfunction, unspecified: Secondary | ICD-10-CM | POA: Diagnosis not present

## 2015-05-08 DIAGNOSIS — N433 Hydrocele, unspecified: Secondary | ICD-10-CM | POA: Diagnosis not present

## 2015-05-08 DIAGNOSIS — N401 Enlarged prostate with lower urinary tract symptoms: Secondary | ICD-10-CM

## 2015-05-08 LAB — BLADDER SCAN AMB NON-IMAGING

## 2015-05-08 NOTE — Progress Notes (Signed)
05/08/2015 11:57 AM   Johnny Maddox 03/04/1956 [id number]  Referring provider: Wardell Honour, MD [address] [place], [place] [address]  Chief Complaint  Patient presents with  . Groin Swelling    Right testicle swelling.    HPI: Johnny Maddox is a 59 year old African-American male with a history of right hydrocele, BPH with LUTS, erectile dysfunction and elevated PSA.  Patient states that over the last several months the right scrotum has become painful and he is fearful he may have cancer. He had seen a urologist several years ago and told it was fluid in the scrotal sac. He is now wanting definitive treatment for the hydrocele.  Patient's BPH with Johnny Maddox is currently being managed with finasteride 5 mg daily his IPS S score is 9/3. Which is moderate LUTS. He is mixed about his urinary symptoms because he does take a diuretic and is diabetic which causes urinary frequency.  His PVR today is 81 mL.     IPSS      05/08/15 1100       International Prostate Symptom Score   How often have you had the sensation of not emptying your bladder? Less than 1 in 5     How often have you had to urinate less than every two hours? About half the time     How often have you found you stopped and started again several times when you urinated? Less than 1 in 5 times     How often have you found it difficult to postpone urination? About half the time     How often have you had a weak urinary stream? Not at All     How often have you had to strain to start urination? Not at All     How many times did you typically get up at night to urinate? 1 Time     Total IPSS Score 9     Quality of Life due to urinary symptoms   If you were to spend the rest of your life with your urinary condition just the way it is now how would you feel about that? Mixed        Score:  1-7 Mild 8-19 Moderate 20-35 Severe   He should also suffers with erectile dysfunction. His SHIM score today is 9 which is in moderate  erectile dysfunction. His main concern is maintenance of the erections for completion of satisfactory intercourse. He is had minimal response to PDE5 inhibitors. I discussed with him vacuum erect aid devices, intracavernosal injections and the penile prosthesis. He is most interested in intracavernosal injections.      SHIM      05/08/15 1151       SHIM: Over the last 6 months:   How do you rate your confidence that you could get and keep an erection? Very Low     When you had erections with sexual stimulation, how often were your erections hard enough for penetration (entering your partner)? A Few Times (much less than half the time)     During sexual intercourse, how often were you able to maintain your erection after you had penetrated (entered) your partner? Very Difficult     During sexual intercourse, how difficult was it to maintain your erection to completion of intercourse? Very Difficult     When you attempted sexual intercourse, how often was it satisfactory for you? Very Difficult     SHIM Total Score   SHIM 9  Score: 1-7 Severe ED 8-11 Moderate ED 12-16 Mild-Moderate ED 17-21 Mild ED 22-25 No ED   PMH: Past Medical History  Diagnosis Date  . Diabetes mellitus without complication   . Hyperlipidemia   . Hypertension   . Acute laryngitis, without mention of obstruction   . Unspecified disorder of autonomic nervous system   . Unspecified vitamin D deficiency   . Chalazion   . Cough   . Allergic rhinitis, cause unspecified   . Unspecified venous (peripheral) insufficiency   . Dermatophytosis of foot   . Seborrheic dermatitis, unspecified   . Unspecified sleep apnea   . Impotence of organic origin     Surgical History: Past Surgical History  Procedure Laterality Date  . Rotator cuff repair  R 2011, L 2006    L and R shoulder  . Tonsillectomy and adenoidectomy      Home Medications:    Medication List       This list is accurate as of: 05/08/15  11:57 AM.  Always use your most recent med list.               amLODipine 10 MG tablet  Commonly known as:  NORVASC  TAKE ONE TABLET BY MOUTH ONE TIME DAILY     aspirin 81 MG tablet  Take 81 mg by mouth 3 (three) times daily.     atorvastatin 20 MG tablet  Commonly known as:  LIPITOR  TAKE ONE TABLET BY MOUTH NIGHTLY AT BEDTIME     CIALIS 20 MG tablet  Generic drug:  tadalafil  Take by mouth.     finasteride 5 MG tablet  Commonly known as:  PROSCAR     furosemide 20 MG tablet  Commonly known as:  LASIX  Take 20 mg by mouth daily.     INVOKAMET [phone] MG Tabs  Generic drug:  Canagliflozin-Metformin HCl  Take 1 tablet by mouth 2 (two) times daily.     INVOKANA 300 MG Tabs tablet  Generic drug:  canagliflozin  Take 300 mg by mouth.     metFORMIN 1000 MG tablet  Commonly known as:  GLUCOPHAGE  Take 1,000 mg by mouth 2 (two) times daily with a meal.     MULTIVITAMIN PO  Take by mouth daily.     ONETOUCH VERIO test strip  Generic drug:  glucose blood  Use 1 strip via meter twice daily as directed.     STENDRA 200 MG Tabs  Generic drug:  Avanafil     TANZEUM 50 MG Pen  Generic drug:  Albiglutide  INJECT 50 MG SUBCUTANEOUSLY EVERY 7 DAYS     VICTOZA 18 MG/3ML Sopn  Generic drug:  Liraglutide        Allergies: No Known Allergies  Family History: Family History  Problem Relation Age of Onset  . Cancer Mother     leukemia  . Hypertension Mother   . Alcohol abuse Father   . Hypertension Brother     Social History:  reports that he has never smoked. He has never used smokeless tobacco. He reports that he drinks alcohol. He reports that he does not use illicit drugs.  ROS: Urological Symptom Review  Patient is experiencing the following symptoms: Frequent urination Erection problems (male only)   Review of Systems  Gastrointestinal (upper)  : Negative for upper GI symptoms  Gastrointestinal (lower) : Negative for lower GI  symptoms  Constitutional : Negative for symptoms  Skin: Negative for skin symptoms  Eyes: Negative for  eye symptoms  Ear/Nose/Throat : Negative for Ear/Nose/Throat symptoms  Hematologic/Lymphatic: Negative for Hematologic/Lymphatic symptoms  Cardiovascular : Negative for cardiovascular symptoms  Respiratory : Negative for respiratory symptoms  Endocrine: Negative for endocrine symptoms  Musculoskeletal: Negative for musculoskeletal symptoms  Neurological: Negative for neurological symptoms  Psychologic: Negative for psychiatric symptoms   Physical Exam: BP 152/96 mmHg  Pulse 80  Resp 18  Ht 5\' 10"  (1.778 m)  Wt 274 lb 9.6 oz (124.558 kg)  BMI 39.40 kg/m2  GU: Patient with uncircumcised phallus. Foreskin easily retracted  Urethral meatus is patent.  No penile discharge. No penile lesions or rashes. Scrotum without lesions, cysts, rashes and/or edema.  Testicles are located scrotally bilaterally. No masses are appreciated in the testicles. Left and right epididymis are normal.  Right hydrocele is noted.    Rectal: Patient with  normal sphincter tone. Perineum without scarring or rashes. No rectal masses are appreciated. Prostate is approximately 60 grams, no nodules are appreciated. Seminal vesicles are normal.    Laboratory Data: Results for orders placed or performed in visit on 05/08/15  BLADDER SCAN AMB NON-IMAGING  Result Value Ref Range   Scan Result 37ml    Lab Results  Component Value Date   WBC 5.5 12/28/2012   HGB 14.1 12/28/2012   HCT 42.7 12/28/2012   MCV 81.6 12/28/2012   PLT 152 12/28/2012    Lab Results  Component Value Date   CREATININE 0.99 12/28/2012    No results found for: PSA  No results found for: TESTOSTERONE  Lab Results  Component Value Date   HGBA1C 10.3* 12/28/2012    Urinalysis No results found for: COLORURINE, APPEARANCEUR, LABSPEC, PHURINE, GLUCOSEU, HGBUR, BILIRUBINUR, KETONESUR, PROTEINUR, UROBILINOGEN,  NITRITE, LEUKOCYTESUR  Pertinent Imaging:   Assessment & Plan:    1. Erectile dysfunction:   Patient's shim score is 9, moderate ED. He has failed PDE 5 inhibitors. He would like to try intracavernosal injections. We will call in the test dose of Trimix to the compounding pharmacy. He will schedule an appointment for the Trimix test injection.    2. BPH with LUTS:   Patient is currently on finasteride 5 mg daily. His lower urinary tract symptoms are complicated by the use of diuretics and his diabetes. His IP SS score is 9/3. We will continue to follow with IPSS, DRE, PSA and PVR in 6 months.  - BLADDER SCAN AMB NON-IMAGING  3. Hydrocele:   Patient is desiring definitive treatment for his hydrocele. We will obtain a scrotal ultrasound to examine the integrity of the scrotal contents. He will follow-up to discuss those results.  4. History of elevated PSA:  Patient was found to have a elevated PSA of 4.6 ng/mL 07/31/2014. DRE noted prostatic enlargement no nodules. He's been started on finasteride 5 mg daily.  His PSA did reduce in value after the addition of finasteride. We will continue to monitor with every 6 months PSA and DRE.  PSA is drawn today.   PSA History:    4.6 ng/mL on 08/09/2015    3.1 ng/mL on 11/07/2014  No Follow-up on file.  Johnny Maddox, [facility] [address], [place], [place] [address] [phone]

## 2015-05-08 NOTE — Telephone Encounter (Signed)
Please call in a test dose for Trimix for the patient to [facility].

## 2015-05-08 NOTE — Telephone Encounter (Signed)
Called trimix into [facility]. Pharmacy will call pt when medication is ready. Cw,lpn

## 2015-05-09 ENCOUNTER — Telehealth: Payer: Self-pay

## 2015-05-09 LAB — PSA: Prostate Specific Ag, Serum: 2.6 ng/mL (ref 0.0–4.0)

## 2015-05-09 NOTE — Telephone Encounter (Signed)
Yes, pt is still taking finasteride. Cw,lpn

## 2015-05-09 NOTE — Telephone Encounter (Signed)
-----   Message from Nori Riis, PA-C sent at 05/09/2015  8:36 AM EDT ----- Is patient still taking his finasteride?

## 2015-05-12 ENCOUNTER — Ambulatory Visit
Admission: RE | Admit: 2015-05-12 | Discharge: 2015-05-12 | Disposition: A | Discharge status: Home / Self Care | Payer: 59 | Source: Ambulatory Visit | Attending: Urology | Admitting: Urology

## 2015-05-12 DIAGNOSIS — N503 Cyst of epididymis: Secondary | ICD-10-CM | POA: Diagnosis not present

## 2015-05-12 DIAGNOSIS — N433 Hydrocele, unspecified: Secondary | ICD-10-CM | POA: Diagnosis present

## 2015-05-16 NOTE — Telephone Encounter (Signed)
Spoke with pt in reference to results. Pt was transferred to the front to make f/u appt. Cw,lpn

## 2015-05-16 NOTE — Telephone Encounter (Signed)
-----   Message from Nori Riis, PA-C sent at 05/14/2015 10:12 PM EDT ----- Patient's needs an office visit to discuss SUS results.

## 2015-06-02 ENCOUNTER — Encounter: Payer: Self-pay | Admitting: *Deleted

## 2015-06-06 ENCOUNTER — Encounter: Payer: Self-pay | Admitting: Urology

## 2015-06-06 ENCOUNTER — Ambulatory Visit (INDEPENDENT_AMBULATORY_CARE_PROVIDER_SITE_OTHER): Payer: 59 | Admitting: Urology

## 2015-06-06 VITALS — BP 128/85 | HR 92 | Resp 18 | Ht 70.0 in | Wt 278.0 lb

## 2015-06-06 DIAGNOSIS — N528 Other male erectile dysfunction: Secondary | ICD-10-CM | POA: Diagnosis not present

## 2015-06-06 DIAGNOSIS — N401 Enlarged prostate with lower urinary tract symptoms: Secondary | ICD-10-CM | POA: Diagnosis not present

## 2015-06-06 DIAGNOSIS — N432 Other hydrocele: Secondary | ICD-10-CM

## 2015-06-06 DIAGNOSIS — N529 Male erectile dysfunction, unspecified: Secondary | ICD-10-CM

## 2015-06-06 DIAGNOSIS — Z87898 Personal history of other specified conditions: Secondary | ICD-10-CM | POA: Insufficient documentation

## 2015-06-06 DIAGNOSIS — N138 Other obstructive and reflux uropathy: Secondary | ICD-10-CM

## 2015-06-06 LAB — URINALYSIS, COMPLETE
Bilirubin, UA: NEGATIVE
Ketones, UA: NEGATIVE
Leukocytes, UA: NEGATIVE
Nitrite, UA: NEGATIVE
Protein, UA: NEGATIVE
RBC, UA: NEGATIVE
Specific Gravity, UA: 1.01 (ref 1.005–1.030)
Urobilinogen, Ur: 0.2 mg/dL (ref 0.2–1.0)
pH, UA: 5.5 (ref 5.0–7.5)

## 2015-06-06 LAB — MICROSCOPIC EXAMINATION
Bacteria, UA: NONE SEEN
RBC, UA: NONE SEEN /hpf (ref 0–?)

## 2015-06-06 NOTE — Progress Notes (Signed)
10:20 AM   Johnny Maddox 01-Oct-1956 [id number]  Referring provider: Wardell Honour, MD [address] [place], [place] [address]  Chief Complaint  Patient presents with  . Follow-up  . Erectile Dysfunction    HPI: Johnny Maddox is a 59 year old African-American male with a history of right hydrocele who underwent a scrotal ultrasound to evaluate the internal scrotal contents.  He presents today to discuss those results.    Patient states that over the last several months the right scrotum has become painful and he is fearful he may have cancer. He had seen a urologist several years ago and told it was fluid in the scrotal sac. He is now wanting definitive treatment for the hydrocele.  Patient underwent a scrotal ultrasound on 05/12/2015 and was found to have a large right-sided hydrocele, small epididymal cysts bilaterally and the testes are normal in echotexture and vascularity.   I have reviewed the films with the patient and agree with the findings.      PMH: Past Medical History  Diagnosis Date  . Hyperlipidemia   . Hypertension   . Acute laryngitis, without mention of obstruction   . Unspecified disorder of autonomic nervous system   . Unspecified vitamin D deficiency   . Chalazion   . Cough   . Allergic rhinitis, cause unspecified   . Unspecified venous (peripheral) insufficiency   . Dermatophytosis of foot   . Seborrheic dermatitis, unspecified   . Unspecified sleep apnea   . Microalbuminuria   . Overweight   . Elevated PSA     Surgical History: Past Surgical History  Procedure Laterality Date  . Rotator cuff repair  R 2011, L 2006    L and R shoulder  . Tonsillectomy and adenoidectomy      Home Medications:    Medication List       This list is accurate as of: 06/06/15 10:20 AM.  Always use your most recent med list.               amLODipine 10 MG tablet  Commonly known as:  NORVASC  TAKE ONE TABLET BY MOUTH ONE TIME DAILY     aspirin 81 MG tablet    Take 81 mg by mouth 3 (three) times daily.     atorvastatin 20 MG tablet  Commonly known as:  LIPITOR  TAKE ONE TABLET BY MOUTH NIGHTLY AT BEDTIME     CIALIS 20 MG tablet  Generic drug:  tadalafil  Take by mouth.     finasteride 5 MG tablet  Commonly known as:  PROSCAR     furosemide 20 MG tablet  Commonly known as:  LASIX  Take 20 mg by mouth daily.     INVOKAMET [phone] MG Tabs  Generic drug:  Canagliflozin-Metformin HCl  Take 1 tablet by mouth 2 (two) times daily.     MULTIVITAMIN PO  Take by mouth daily.     ONETOUCH VERIO test strip  Generic drug:  glucose blood  Use 1 strip via meter twice daily as directed.     STENDRA 200 MG Tabs  Generic drug:  Avanafil     TANZEUM 50 MG Pen  Generic drug:  Albiglutide  INJECT 50 MG SUBCUTANEOUSLY EVERY 7 DAYS        Allergies:  Allergies  Allergen Reactions  . Other Anaphylaxis    Mushrooms    Family History: Family History  Problem Relation Age of Onset  . Cancer Mother  leukemia  . Hypertension Mother   . Alcohol abuse Father   . Hypertension Brother   . Prostate cancer Neg Hx   . Bladder Cancer Neg Hx     Social History:  reports that he has quit smoking. He has never used smokeless tobacco. He reports that he drinks alcohol. He reports that he does not use illicit drugs.  ROS: Urological Symptom Review  Patient is experiencing the following symptoms: Frequent urination Erection problems (male only)   Review of Systems  Gastrointestinal (upper)  : Negative for upper GI symptoms  Gastrointestinal (lower) : Negative for lower GI symptoms  Constitutional : Negative for symptoms  Skin: Negative for skin symptoms  Eyes: Negative for eye symptoms  Ear/Nose/Throat : Negative for Ear/Nose/Throat symptoms  Hematologic/Lymphatic: Negative for Hematologic/Lymphatic symptoms  Cardiovascular : Negative for cardiovascular symptoms  Respiratory : Negative for respiratory  symptoms  Endocrine: Negative for endocrine symptoms  Musculoskeletal: Negative for musculoskeletal symptoms  Neurological: Negative for neurological symptoms  Psychologic: Negative for psychiatric symptoms   Physical Exam: BP 128/85 mmHg  Pulse 92  Resp 18  Ht 5\' 10"  (1.778 m)  Wt 278 lb (126.1 kg)  BMI 39.89 kg/m2    Laboratory Data: Results for orders placed or performed in visit on 05/08/15  PSA  Result Value Ref Range   Prostate Specific Ag, Serum 2.6 0.0 - 4.0 ng/mL  BLADDER SCAN AMB NON-IMAGING  Result Value Ref Range   Scan Result 48ml    Lab Results  Component Value Date   WBC 5.5 12/28/2012   HGB 14.1 12/28/2012   HCT 42.7 12/28/2012   MCV 81.6 12/28/2012   PLT 152 12/28/2012    Lab Results  Component Value Date   CREATININE 0.99 12/28/2012    Lab Results  Component Value Date   PSA 2.6 05/08/2015    No results found for: TESTOSTERONE  Lab Results  Component Value Date   HGBA1C 10.3* 12/28/2012    Urinalysis No results found for: COLORURINE, APPEARANCEUR, LABSPEC, PHURINE, GLUCOSEU, HGBUR, BILIRUBINUR, KETONESUR, PROTEINUR, UROBILINOGEN, NITRITE, LEUKOCYTESUR  Pertinent Imaging:   Assessment & Plan:    1. Hydrocele:   Patient's scrotal ultrasound confirms the hydrocele.  I explained to the patient how the hydrocelectomy is performed and the risk factors, such as: injury to spermatic vessels can occur, affecting fertility,  bleeding from the surgical incision, internal bleeding, infection and swelling of the scrotum for up to a month.  I also informed him of the recovery period of being able to resume most activities within seven to 10 days, although heavy lifting and sexual activities may be delayed for up to six weeks.   I also explained the risks of general anesthesia, such as: MI, CVA, paralysis, coma and/or death.  He is a single parent and will need to make child care arrangements prior to scheduling this procedure.     2.  Erectile dysfunction:   Patient's SHIM score is 9, moderate ED. He has failed PDE 5 inhibitors. He has obtained the TriMix test dose from the compounding pharmacy.   He will schedule an appointment for the Trimix test injection.  3. BPH with LUTS:   Patient is currently on finasteride 5 mg daily. His lower urinary tract symptoms are complicated by the use of diuretics and his diabetes. His IPSS score is 9/3. We will continue to follow with IPSS, DRE, PSA and PVR in 6 months.  4. History of elevated PSA:  Patient was found to have a  elevated PSA of 4.6 ng/mL 07/31/2014. DRE noted prostatic enlargement no nodules. He's been started on finasteride 5 mg daily.  His PSA did reduce in value after the addition of finasteride. We will continue to monitor with every 6 months PSA and DRE.    PSA History:    4.6 ng/mL on 08/09/2015    3.1 ng/mL on 11/07/2014    2.6 ng/mL on 05/08/2015  Return for Patient to call for appointment.  Zara Council, [facility] [address], [place], [place] [address] [phone]

## 2015-06-08 LAB — CULTURE, URINE COMPREHENSIVE

## 2015-06-17 ENCOUNTER — Ambulatory Visit (INDEPENDENT_AMBULATORY_CARE_PROVIDER_SITE_OTHER): Payer: 59 | Admitting: Emergency Medicine

## 2015-06-17 VITALS — BP 124/88 | HR 85 | Temp 98.2°F | Resp 18 | Ht 69.5 in | Wt 274.0 lb

## 2015-06-17 DIAGNOSIS — B3742 Candidal balanitis: Secondary | ICD-10-CM | POA: Diagnosis not present

## 2015-06-17 MED ORDER — NYSTATIN 100000 UNIT/GM EX OINT
1.0000 | TOPICAL_OINTMENT | Freq: Two times a day (BID) | CUTANEOUS | Status: DC
Start: 2015-06-17 — End: 2015-12-11

## 2015-06-17 MED ORDER — FLUCONAZOLE 200 MG PO TABS: 200.0000 mg | ORAL_TABLET | Freq: Every day | ORAL | Status: DC

## 2015-06-17 NOTE — Patient Instructions (Signed)
Balanitis  Balanitis is inflammation of the head of the penis (glans).   CAUSES   Balanitis has multiple causes, both infectious and noninfectious. Often balanitis is the result of poor personal hygiene, especially in uncircumcised males. Without adequate washing, viruses, bacteria, and yeast collect between the foreskin and the glans. This can cause an infection. Lack of air and irritation from a normal secretion called smegma contribute to the cause in uncircumcised males. Other causes include:   Chemical irritation from the use of certain soaps and shower gels (especially soaps with perfumes), condoms, personal lubricants, petroleum jelly, spermicides, and fabric conditioners.   Skin conditions, such as eczema, dermatitis, and psoriasis.   Allergies to drugs, such as tetracycline and sulfa.   Certain medical conditions, including liver cirrhosis, congestive heart failure, and kidney disease.   Morbid obesity.  RISK FACTORS   Diabetes mellitus.   A tight foreskin that is difficult to pull back past the glans (phimosis).   Sex without the use of a condom.  SIGNS AND SYMPTOMS   Symptoms may include:   Discharge coming from under the foreskin.   Tenderness.   Itching and inability to get an erection (because of the pain).   Redness and a rash.   Sores on the glans and on the foreskin.  DIAGNOSIS  Diagnosis of balanitis is confirmed through a physical exam.  TREATMENT  The treatment is based on the cause of the balanitis. Treatment may include:   Frequent cleansing.   Keeping the glans and foreskin dry.   Use of medicines such as creams, pain medicines, antibiotics, or medicines to treat fungal infections.   Sitz baths.  If the irritation has caused a scar on the foreskin that prevents easy retraction, a circumcision may be recommended.   HOME CARE INSTRUCTIONS   Sex should be avoided until the condition has cleared.  MAKE SURE YOU:   Understand these instructions.   Will watch your  condition.   Will get help right away if you are not doing well or get worse.  Document Released: 03/16/2009 Document Revised: 11/02/2013 Document Reviewed: 04/19/2013  Exit[employer] Patient Information 2015 [employer]. This information is not intended to replace advice given to you by your health care provider. Make sure you discuss any questions you have with your health care provider.

## 2015-06-17 NOTE — Progress Notes (Signed)
Subjective:  Patient ID: Johnny Maddox, male    DOB: October 21, 1956  Age: 59 y.o. MRN: [id number]  CC: Other   HPI Johnny Maddox presents  he presents with concerns about recurrent yeast infections on his penis while taking Invokana. He's had several yeast infections and has been treating this current yeast infection with dose of Diflucan with no improvement. He is resistant to changing his medical therapy to exclude Invokana as this is given good control. He was advised by her neurologist that he should have surgical treatment for bilateral hydroceles.  History Johnny Maddox has a past medical history of Hyperlipidemia; Hypertension; Acute laryngitis, without mention of obstruction; Unspecified disorder of autonomic nervous system; Unspecified vitamin D deficiency; Chalazion; Cough; Allergic rhinitis, cause unspecified; Unspecified venous (peripheral) insufficiency; Dermatophytosis of foot; Seborrheic dermatitis, unspecified; Unspecified sleep apnea; Microalbuminuria; Overweight; and Elevated PSA.   He has past surgical history that includes Rotator cuff repair (R 2011, L 2006) and Tonsillectomy and adenoidectomy.   His  family history includes Alcohol abuse in his father; Cancer in his mother; Hypertension in his brother and mother. There is no history of Prostate cancer or Bladder Cancer.  He   reports that he has quit smoking. He has never used smokeless tobacco. He reports that he drinks alcohol. He reports that he does not use illicit drugs.  Outpatient Prescriptions Prior to Visit  Medication Sig Dispense Refill  . atorvastatin (LIPITOR) 20 MG tablet TAKE ONE TABLET BY MOUTH NIGHTLY AT BEDTIME 30 tablet 2  . finasteride (PROSCAR) 5 MG tablet     . furosemide (LASIX) 20 MG tablet Take 20 mg by mouth daily.    Marland Kitchen glucose blood (ONETOUCH VERIO) test strip Use 1 strip via meter twice daily as directed.    . INVOKAMET [phone] MG TABS Take 1 tablet by mouth 2 (two) times daily.  1  . Multiple  Vitamins-Minerals (MULTIVITAMIN PO) Take by mouth daily.    Marland Kitchen STENDRA 200 MG TABS     . tadalafil (CIALIS) 20 MG tablet Take by mouth.    Marland Kitchen TANZEUM 50 MG PEN INJECT 50 MG SUBCUTANEOUSLY EVERY 7 DAYS  5  . amLODipine (NORVASC) 10 MG tablet TAKE ONE TABLET BY MOUTH ONE TIME DAILY (Patient not taking: Reported on 06/17/2015) 30 tablet 2  . aspirin 81 MG tablet Take 81 mg by mouth 3 (three) times daily.      No facility-administered medications prior to visit.    History   Social History  . Marital Status: Single    Spouse Name: n/a  . Number of Children: 2  . Years of Education: 18   Occupational History  . 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 Smoking status: Former Research scientist (life sciences)  . Smokeless tobacco: Never Used     Comment: quit over 40 years ago  . Alcohol Use: 0.0 - 0.6 oz/week    0-1 Cans of beer per week     Comment: paranoia about alcholism; alcohol makes me sleepy.  . Drug Use: No  . Sexual Activity:    Partners: Female    Birth Control/ Protection: Condom   Other Topics Concern  . None   Social History Narrative   Lives alone. Adult son is in the [employer].  His daughter lives with her mother (his ex-wife).    Separated; after 6 years of marriage. Not dating.   Always uses seat belts.    Smoke alarm in the home.  No guns in the home.    Exercise: Moderate 3 x week, 20-30 minutes,walking, member of the [facility].   Caffeine use: Moderate.     Review of Systems  Constitutional: Negative for fever, chills and appetite change.  HENT: Negative for congestion, ear pain, postnasal drip, sinus pressure and sore throat.   Eyes: Negative for pain and redness.  Respiratory: Negative for cough, shortness of breath and wheezing.   Cardiovascular: Negative for leg swelling.  Gastrointestinal: Negative for nausea, vomiting, abdominal pain, diarrhea, constipation and blood in stool.  Endocrine: Negative for polyuria.  Genitourinary: Negative for dysuria,  urgency, frequency and flank pain.  Musculoskeletal: Negative for gait problem.  Skin: Negative for rash.  Neurological: Negative for weakness and headaches.  Psychiatric/Behavioral: Negative for confusion and decreased concentration. The patient is not nervous/anxious.     Objective:  BP 124/88 mmHg  Pulse 85  Temp(Src) 98.2 F (36.8 C) (Oral)  Resp 18  Ht 5' 9.5" (1.765 m)  Wt 274 lb (124.286 kg)  BMI 39.90 kg/m2  SpO2 97%  Physical Exam  Constitutional: He is oriented to person, place, and time. He appears well-developed and well-nourished.  HENT:  Head: Normocephalic and atraumatic.  Eyes: Conjunctivae are normal. Pupils are equal, round, and reactive to light.  Pulmonary/Chest: Effort normal.  Genitourinary: Penile erythema:  erythema and exudate involving the glans and underside of the foreskin.  Musculoskeletal: He exhibits no edema.  Neurological: He is alert and oriented to person, place, and time.  Skin: Skin is dry.  Psychiatric: He has a normal mood and affect. His behavior is normal. Thought content normal.      Assessment & Plan:   Johnny Maddox was seen today for other.  Diagnoses and all orders for this visit:  Candidal balanitis  Other orders -     fluconazole (DIFLUCAN) 200 MG tablet; Take 1 tablet (200 mg total) by mouth daily. -     nystatin ointment (MYCOSTATIN); Apply 1 application topically 2 (two) times daily.   I am having Johnny Maddox start on fluconazole and nystatin ointment. I am also having him maintain his atorvastatin, Multiple Vitamins-Minerals (MULTIVITAMIN PO), aspirin, amLODipine, furosemide, TANZEUM, STENDRA, glucose blood, INVOKAMET, tadalafil, finasteride, and fluconazole.  Meds ordered this encounter  Medications  . fluconazole (DIFLUCAN) 150 MG tablet    Sig: Take 150 mg by mouth daily.  . fluconazole (DIFLUCAN) 200 MG tablet    Sig: Take 1 tablet (200 mg total) by mouth daily.    Dispense:  7 tablet    Refill:  0  . nystatin  ointment (MYCOSTATIN)    Sig: Apply 1 application topically 2 (two) times daily.    Dispense:  30 g    Refill:  0   I suggested strongly that he follow-up with his urologist and discuss a circumcision at the same time he discusses his surgery for hydroceles should not be contraindicated noted save him a period of recuperation.  Appropriate red flag conditions were discussed with the patient as well as actions that should be taken.  Patient expressed his understanding.  Follow-up: Return if symptoms worsen or fail to improve.  Roselee Culver, MD

## 2015-09-13 ENCOUNTER — Encounter: Payer: Self-pay | Admitting: Family Medicine

## 2015-09-13 ENCOUNTER — Ambulatory Visit (INDEPENDENT_AMBULATORY_CARE_PROVIDER_SITE_OTHER): Payer: 59 | Admitting: Family Medicine

## 2015-09-13 VITALS — BP 118/78 | HR 105 | Temp 98.6°F | Resp 16 | Ht 69.0 in | Wt 271.0 lb

## 2015-09-13 DIAGNOSIS — E559 Vitamin D deficiency, unspecified: Secondary | ICD-10-CM | POA: Insufficient documentation

## 2015-09-13 DIAGNOSIS — B3742 Candidal balanitis: Secondary | ICD-10-CM

## 2015-09-13 MED ORDER — FLUCONAZOLE 200 MG PO TABS: 200.0000 mg | ORAL_TABLET | Freq: Every day | ORAL | Status: DC

## 2015-09-13 NOTE — Progress Notes (Signed)
Subjective:    Patient ID: Johnny Maddox, male    DOB: 1956/04/26, 59 y.o.   MRN: [id number]  HPI This is a pleasant 59 yo male who presents today with suspected yeast infection of his penis for several weeks. It started with some white discharge and he cleaned carefully and used nystatin cream without relief. Then in last 2 weeks his glands became red and irritated. This is an intermittent problem and was last cleared by taking diflucan. He has seen urology for this as well as chronic hydrocele and is planning hydrocele evacuation and circumcision at a later date.   His blood sugars have been running 130s. Last HgbA1C 7.1 about 4-6 weeks ago. Has been losing weight by watching diet. Has seen Johnny Maddox for endocrine, but is switching to Johnny Maddox and has an appointment later this month.   Past Medical History  Diagnosis Date  . Hyperlipidemia   . Hypertension   . Acute laryngitis, without mention of obstruction   . Unspecified disorder of autonomic nervous system   . Unspecified vitamin D deficiency   . Chalazion   . Cough   . Allergic rhinitis, cause unspecified   . Unspecified venous (peripheral) insufficiency   . Dermatophytosis of foot   . Seborrheic dermatitis, unspecified   . Unspecified sleep apnea   . Microalbuminuria   . Overweight   . Elevated PSA    Past Surgical History  Procedure Laterality Date  . Rotator cuff repair  R 2011, L 2006    L and R shoulder  . Tonsillectomy and adenoidectomy     Family History  Problem Relation Age of Onset  . Cancer Mother     leukemia  . Hypertension Mother   . Alcohol abuse Father   . Hypertension Brother   . Prostate cancer Neg Hx   . Bladder Cancer Neg Hx    Social History  Substance Use Topics  . Smoking status: Former Research scientist (life sciences)  . Smokeless tobacco: Never Used     Comment: quit over 40 years ago  . Alcohol Use: 0.0 - 0.6 oz/week    0-1 Cans of beer per week     Comment: paranoia about alcholism; alcohol makes me sleepy.       Review of Systems No other skin involvement, no penile discharge, no dysuria, no frequency, no hematuria, no flank pain. No testicular pain or swelling.     Objective:   Physical Exam  Constitutional: He is oriented to person, place, and time. He appears well-developed and well-nourished.  HENT:  Head: Normocephalic and atraumatic.  Eyes: Conjunctivae are normal.  Neck: Normal range of motion. Neck supple.  Pulmonary/Chest: Effort normal.  Genitourinary: Uncircumcised. Discharge found.  Foreskin easily retracted, small amount white discharge at base of glans and slight erythema. No tenderness.    Musculoskeletal: Normal range of motion.  Neurological: He is alert and oriented to person, place, and time.  Skin: Skin is warm and dry.  Psychiatric: He has a normal mood and affect. His behavior is normal. Judgment and thought content normal.  Vitals reviewed.  BP 118/78 mmHg  Pulse 105  Temp(Src) 98.6 F (37 C) (Oral)  Resp 16  Ht 5\' 9"  (1.753 m)  Wt 271 lb (122.925 kg)  BMI 40.00 kg/m2 Wt Readings from Last 3 Encounters:  09/13/15 271 lb (122.925 kg)  06/17/15 274 lb (124.286 kg)  06/06/15 278 lb (126.1 kg)       Assessment & Plan:  1. Candidal balanitis -  fluconazole (DIFLUCAN) 200 MG tablet; Take 1 tablet (200 mg total) by mouth daily.  Dispense: 14 tablet; Refill: 0 - When documenting following patient's appointment I noticed that I mistakenly wrote for #14, I called him and told him to take for only 3-5 days and if not resolved, let me know.  - He will discuss his recurrent problems with Johnny Maddox this month and he intends to follow up with urology as well.   Johnny Reamer, FNP-BC  [facility]  09/15/2015 12:18 PM

## 2015-09-28 LAB — HEMOGLOBIN A1C: Hemoglobin A1C: 7.8

## 2015-10-10 ENCOUNTER — Ambulatory Visit: Payer: 59 | Admitting: Obstetrics and Gynecology

## 2015-10-11 ENCOUNTER — Encounter: Payer: Self-pay | Admitting: Urology

## 2015-10-11 ENCOUNTER — Ambulatory Visit (INDEPENDENT_AMBULATORY_CARE_PROVIDER_SITE_OTHER): Payer: 59 | Admitting: Urology

## 2015-10-11 VITALS — BP 137/74 | HR 112 | Ht 70.0 in | Wt 279.0 lb

## 2015-10-11 DIAGNOSIS — N481 Balanitis: Secondary | ICD-10-CM

## 2015-10-11 DIAGNOSIS — N433 Hydrocele, unspecified: Secondary | ICD-10-CM

## 2015-10-11 DIAGNOSIS — N401 Enlarged prostate with lower urinary tract symptoms: Secondary | ICD-10-CM | POA: Diagnosis not present

## 2015-10-11 DIAGNOSIS — Z87898 Personal history of other specified conditions: Secondary | ICD-10-CM | POA: Diagnosis not present

## 2015-10-11 DIAGNOSIS — N138 Other obstructive and reflux uropathy: Secondary | ICD-10-CM

## 2015-10-11 DIAGNOSIS — N528 Other male erectile dysfunction: Secondary | ICD-10-CM | POA: Diagnosis not present

## 2015-10-11 DIAGNOSIS — N529 Male erectile dysfunction, unspecified: Secondary | ICD-10-CM

## 2015-10-11 MED ORDER — FLUCONAZOLE 200 MG PO TABS: 200.0000 mg | ORAL_TABLET | Freq: Every day | ORAL | Status: DC

## 2015-10-11 NOTE — Progress Notes (Signed)
10/11/2015 12:54 PM   Johnny Maddox August 13, 1956 [id number]  Referring provider: Wardell Honour, MD [address] [place], [place] [address]  Chief Complaint  Patient presents with  . Balanitis    HPI: Patient is a 59 year old African-American male with diabetes who presents today stating his balanitis will not go away.  He also has a history of elevated PSA, hydroceles, erectile dysfunction and BPH with LUTS.    Balanitis Patient states his balanitis keeps reoccurring. He has used nystatin cream, been on Diflucan orally and is using good hygiene techniques. His last hemoglobin A1c was 7.8% on 09/28/2015.  History of elevated PSA Patient presented to [place] initially for erectile dysfunction and his PSA was found to be 4.6 ng/mL on 08/08/2014.  He was then placed on finasteride 5 mg daily and a repeated PSA on 11/07/2014 was 3.1.  He was to return in May 2016 for follow-up but he did not keep that appointment.  He has been continuing the finasteride 5 mg daily.  His PCP did obtain a PSA in June 2016 and it was 2.6 ng/mL.  Erectile dysfunction Patient initially presented with erectile dysfunction back in September 2015. He was diabetic and his diabetes was not under good control at that time. It was recommended to work with his primary care physician to get his diabetes under control. He was given Stendra samples and a prescription was written. The medication was not approved and the patient did not keep his follow-up appointment. He is still experiencing erectile dysfunction at this time, but he is more concerned with the balanitis.  Hydroceles Patient has a moderate right-sided hydrocele that he would like addressed in the future.  It is becoming bothersome, but it has not changed in size. He would like to undergo hydrocelectomy for definitive treatment of a hydrocele, but he states he will have to postpone that until next year.    BPH WITH LUTS His IPSS score today is 13, which is moderate  lower urinary tract symptomatology. He is pleased with his quality life due to his urinary symptoms.  He denies any dysuria, hematuria or suprapubic pain.  He currently taking finasteride 5 mg daily.  He also denies any recent fevers, chills, nausea or vomiting. He does not have a family history of PCa.      IPSS      10/11/15 1000       International Prostate Symptom Score   How often have you had the sensation of not emptying your bladder? Almost always     How often have you had to urinate less than every two hours? About half the time     How often have you found you stopped and started again several times when you urinated? Not at All     How often have you found it difficult to postpone urination? About half the time     How often have you had a weak urinary stream? Not at All     How often have you had to strain to start urination? Not at All     How many times did you typically get up at night to urinate? 2 Times     Total IPSS Score 13     Quality of Life due to urinary symptoms   If you were to spend the rest of your life with your urinary condition just the way it is now how would you feel about that? Pleased        Score:  1-7 Mild 8-19 Moderate 20-35 Severe   PMH: Past Medical History  Diagnosis Date  . Hyperlipidemia   . Hypertension   . Acute laryngitis, without mention of obstruction   . Unspecified disorder of autonomic nervous system   . Unspecified vitamin D deficiency   . Chalazion   . Cough   . Allergic rhinitis, cause unspecified   . Unspecified venous (peripheral) insufficiency   . Dermatophytosis of foot   . Seborrheic dermatitis, unspecified   . Unspecified sleep apnea   . Microalbuminuria   . Overweight   . Elevated PSA     Surgical History: Past Surgical History  Procedure Laterality Date  . Rotator cuff repair  R 2011, L 2006    L and R shoulder  . Tonsillectomy and adenoidectomy      Home Medications:    Medication List         This list is accurate as of: 10/11/15 12:54 PM.  Always use your most recent med list.               amLODipine 10 MG tablet  Commonly known as:  NORVASC  TAKE ONE TABLET BY MOUTH ONE TIME DAILY     aspirin 81 MG tablet  Take 81 mg by mouth 3 (three) times daily.     atorvastatin 10 MG tablet  Commonly known as:  LIPITOR  Take 10 mg by mouth daily.     CIALIS 20 MG tablet  Generic drug:  tadalafil  Take by mouth.     finasteride 5 MG tablet  Commonly known as:  PROSCAR     fluconazole 200 MG tablet  Commonly known as:  DIFLUCAN  Take 1 tablet (200 mg total) by mouth daily.     furosemide 20 MG tablet  Commonly known as:  LASIX  Take 20 mg by mouth daily.     glimepiride 1 MG tablet  Commonly known as:  AMARYL  TAKE 1 TABLET (1 MG TOTAL) BY MOUTH DAILY WITH BREAKFAST.     metFORMIN 1000 MG tablet  Commonly known as:  GLUCOPHAGE  TAKE 1 TABLET (1,000 MG TOTAL) BY MOUTH 2 (TWO) TIMES DAILY WITH MEALS.     MULTIVITAMIN PO  Take by mouth daily.     nystatin ointment  Commonly known as:  MYCOSTATIN  Apply 1 application topically 2 (two) times daily.     ONETOUCH VERIO test strip  Generic drug:  glucose blood  Use 1 strip via meter twice daily as directed.     STENDRA 200 MG Tabs  Generic drug:  Avanafil     TANZEUM 50 MG Pen  Generic drug:  Albiglutide  INJECT 50 MG SUBCUTANEOUSLY EVERY 7 DAYS        Allergies:  Allergies  Allergen Reactions  . Other Anaphylaxis    Mushrooms    Family History: Family History  Problem Relation Age of Onset  . Cancer Mother     leukemia  . Hypertension Mother   . Alcohol abuse Father   . Hypertension Brother   . Prostate cancer Neg Hx   . Bladder Cancer Neg Hx     Social History:  reports that he has quit smoking. He has never used smokeless tobacco. He reports that he drinks alcohol. He reports that he does not use illicit drugs.  ROS: UROLOGY Frequent Urination?: No Hard to postpone urination?:  No Burning/pain with urination?: No Get up at night to urinate?: No Leakage of urine?: No Urine stream  starts and stops?: No Trouble starting stream?: No Do you have to strain to urinate?: No Blood in urine?: No Urinary tract infection?: No Sexually transmitted disease?: No Injury to kidneys or bladder?: No Painful intercourse?: No Weak stream?: No Erection problems?: No Penile pain?: No  Gastrointestinal Nausea?: No Vomiting?: No Indigestion/heartburn?: No Diarrhea?: No Constipation?: No  Constitutional Fever: No Night sweats?: No Weight loss?: No Fatigue?: No  Skin Skin rash/lesions?: No Itching?: No  Eyes Blurred vision?: No Double vision?: No  Ears/Nose/Throat Sore throat?: No Sinus problems?: No  Hematologic/Lymphatic Swollen glands?: No Easy bruising?: No  Cardiovascular Leg swelling?: No Chest pain?: No  Respiratory Cough?: No Shortness of breath?: No  Endocrine Excessive thirst?: No  Musculoskeletal Back pain?: No Joint pain?: No  Neurological Headaches?: No Dizziness?: No  Psychologic Depression?: No Anxiety?: No  Physical Exam: BP 137/74 mmHg  Pulse 112  Ht 5\' 10"  (1.778 m)  Wt 279 lb (126.554 kg)  BMI 40.03 kg/m2  GU: No CVA tenderness.  No bladder fullness or masses.  Patient with uncircumcised phallus. Foreskin easily retracted  Urethral meatus is patent.  No penile discharge. No penile lesions or rashes. Scrotum without lesions, cysts, rashes and/or edema.  Testicles are located scrotally bilaterally. No masses are appreciated in the testicles.Right > Left hydrocele noted.  Left and right epididymis are normal. Rectal: Patient with  normal sphincter tone. Anus and perineum without scarring or rashes. No rectal masses are appreciated. Prostate is approximately 50 grams, no nodules are appreciated. Seminal vesicles are normal.   Laboratory Data: Lab Results  Component Value Date   WBC 5.5 12/28/2012   HGB 14.1 12/28/2012    HCT 42.7 12/28/2012   MCV 81.6 12/28/2012   PLT 152 12/28/2012    Lab Results  Component Value Date   CREATININE 0.99 12/28/2012   PSA History  4.6 ng/mL on 08/08/2014  3.1 ng/mL on 11/07/2014  Lab Results  Component Value Date   PSA 2.6 05/08/2015   Lab Results  Component Value Date   HGBA1C 10.3* 12/28/2012      Assessment & Plan:    1. History of elevated PSA:   Patient's PSA has reduced from 4.6 ng/mL in 07/2014 to 2.6 ng/mL in 04/2015 on finasteride.  We have repeated a PSA today.  He will return in 6 months for a PSA and exam.  2. BPH with obstruction/lower urinary tract symptoms:   IPSS score is 13/1.  We will continue to monitor.  He will return in 6 months for a IPSS score, PSA and exam.    - PSA  3. Balanitis:   Patient would like to undergo a circumcision.  He would like a script for fluconazole in the interim.  I have sent one to his pharmacy.  Patient is advised that the circumcision is performed in the office under a local anesthetic.  He would have absorbable sutures that would take approximately ten days to absorb.  He would have pain, bleeding and swelling after the procedure.  There is a risk of infection, bleeding and pain after the procedure.  He will need to stop his ASA 81 mg ten days prior to the procedure.  He will call back to schedule.    4. Erectile dysfunction:   We will table this issue until after his circumcision.  He will be returning in 6 months for a SHIM and exam.  5. Hydroceles:   Patient would like to undergo hydrocelectomy in the future.  We will discuss  this further at his appointment in 6 months.   Return in about 6 months (around 04/09/2016) for IPSS score, SHIM and PVR.  Zara Council, [facility] [address], [place], [place] [address] [phone]

## 2015-10-12 LAB — PSA: Prostate Specific Ag, Serum: 2.5 ng/mL (ref 0.0–4.0)

## 2015-10-16 ENCOUNTER — Encounter: Payer: 59 | Admitting: Family Medicine

## 2015-10-18 ENCOUNTER — Telehealth: Payer: Self-pay

## 2015-10-18 DIAGNOSIS — R972 Elevated prostate specific antigen [PSA]: Secondary | ICD-10-CM

## 2015-10-18 NOTE — Telephone Encounter (Signed)
-----   Message from Nori Riis, PA-C sent at 10/13/2015  2:19 PM EST ----- Patient's PSA is stable.  We will see him in 6 months.  PSA to be drawn before his next appointment.

## 2015-10-18 NOTE — Telephone Encounter (Signed)
Spoke with pt in reference to PSA results. Pt voiced understanding. Orders placed. Pt also stated he wants to schedule surgery for 11/10/15. Pt was transferred to Amy to discuss surgery.

## 2015-11-09 ENCOUNTER — Encounter: Payer: Self-pay | Admitting: Family Medicine

## 2015-11-10 ENCOUNTER — Encounter: Payer: Self-pay | Admitting: Urology

## 2015-11-10 ENCOUNTER — Ambulatory Visit (INDEPENDENT_AMBULATORY_CARE_PROVIDER_SITE_OTHER): Payer: 59 | Admitting: Urology

## 2015-11-10 VITALS — BP 148/89 | HR 98 | Ht 70.0 in | Wt 270.0 lb

## 2015-11-10 DIAGNOSIS — N481 Balanitis: Secondary | ICD-10-CM | POA: Diagnosis not present

## 2015-11-10 MED ORDER — HYDROCODONE-ACETAMINOPHEN 5-325 MG PO TABS: 1.0000 | ORAL_TABLET | Freq: Four times a day (QID) | ORAL | Status: DC | PRN

## 2015-11-10 NOTE — Progress Notes (Signed)
11/10/2015 5:55 PM   BATU KITTLES 09/08/1956 [id number]  Referring provider: Wardell Honour, MD [address] [place], [place] [address]  Chief Complaint  Patient presents with  . Circumcision    HPI: 59 year old male with recurrent persistent balanitis who presents today for office circumcision.   He is used nystatin cream along with Diflucan and other techniques but unable to clear his symptoms.  Risk and benefits were previously discussed in the office.  PMH: Past Medical History  Diagnosis Date  . Hyperlipidemia   . Hypertension   . Acute laryngitis, without mention of obstruction   . Unspecified disorder of autonomic nervous system   . Unspecified vitamin D deficiency   . Chalazion   . Cough   . Allergic rhinitis, cause unspecified   . Unspecified venous (peripheral) insufficiency   . Dermatophytosis of foot   . Seborrheic dermatitis, unspecified   . Unspecified sleep apnea   . Microalbuminuria   . Overweight   . Elevated PSA     Surgical History: Past Surgical History  Procedure Laterality Date  . Rotator cuff repair  R 2011, L 2006    L and R shoulder  . Tonsillectomy and adenoidectomy      Home Medications:    Medication List       This list is accurate as of: 11/10/15  5:55 PM.  Always use your most recent med list.               amLODipine 10 MG tablet  Commonly known as:  NORVASC  TAKE ONE TABLET BY MOUTH ONE TIME DAILY     aspirin 81 MG tablet  Take 81 mg by mouth 3 (three) times daily. Reported on 11/10/2015     atorvastatin 10 MG tablet  Commonly known as:  LIPITOR  Take 10 mg by mouth daily.     CIALIS 20 MG tablet  Generic drug:  tadalafil  Take by mouth.     finasteride 5 MG tablet  Commonly known as:  PROSCAR     fluconazole 200 MG tablet  Commonly known as:  DIFLUCAN  Take 1 tablet (200 mg total) by mouth daily.     furosemide 20 MG tablet  Commonly known as:  LASIX  Take 20 mg by mouth daily.     glimepiride 1  MG tablet  Commonly known as:  AMARYL  TAKE 1 TABLET (1 MG TOTAL) BY MOUTH DAILY WITH BREAKFAST.     HYDROcodone-acetaminophen 5-325 MG tablet  Commonly known as:  NORCO/VICODIN  Take 1-2 tablets by mouth every 6 (six) hours as needed for moderate pain.     metFORMIN 1000 MG tablet  Commonly known as:  GLUCOPHAGE  TAKE 1 TABLET (1,000 MG TOTAL) BY MOUTH 2 (TWO) TIMES DAILY WITH MEALS.     MULTIVITAMIN PO  Take by mouth daily.     nystatin ointment  Commonly known as:  MYCOSTATIN  Apply 1 application topically 2 (two) times daily.     ONETOUCH VERIO test strip  Generic drug:  glucose blood  Use 1 strip via meter twice daily as directed.     STENDRA 200 MG Tabs  Generic drug:  Avanafil     TANZEUM 50 MG Pen  Generic drug:  Albiglutide  INJECT 50 MG SUBCUTANEOUSLY EVERY 7 DAYS        Allergies:  Allergies  Allergen Reactions  . Other Anaphylaxis    Mushrooms    Family History: Family History  Problem Relation  Age of Onset  . Cancer Mother     leukemia  . Hypertension Mother   . Alcohol abuse Father   . Hypertension Brother   . Prostate cancer Neg Hx   . Bladder Cancer Neg Hx     Social History:  reports that he has quit smoking. He has never used smokeless tobacco. He reports that he drinks alcohol. He reports that he does not use illicit drugs.   Physical Exam: BP 148/89 mmHg  Pulse 98  Ht 5\' 10"  (1.778 m)  Wt 270 lb (122.471 kg)  BMI 38.74 kg/m2  Constitutional:  Alert and oriented, No acute distress. HEENT: [place] AT, moist mucus membranes.  Trachea midline, no masses. Cardiovascular: No clubbing, cyanosis, or edema. Respiratory: Normal respiratory effort, no increased work of breathing. GI: Abdomen is soft, nontender, nondistended, no abdominal masses GU: No CVA tenderness. Uncircumcised phallus, foreskin easily retractable, mildly edematous.  Normal scrotum with bilateral descended testicles. Skin: No rashes, bruises or suspicious lesions. Neurologic:  Grossly intact, no focal deficits, moving all 4 extremities. Psychiatric: Normal mood and affect.  Laboratory Data: Lab Results  Component Value Date   WBC 5.5 12/28/2012   HGB 14.1 12/28/2012   HCT 42.7 12/28/2012   MCV 81.6 12/28/2012   PLT 152 12/28/2012    Lab Results  Component Value Date   CREATININE 0.99 12/28/2012    Lab Results  Component Value Date   PSA 2.5 10/11/2015   PSA 2.6 05/08/2015    No results found for: TESTOSTERONE  Lab Results  Component Value Date   HGBA1C 7.8 09/28/2015   Circumcision Procedure   Informed consistent obtained.  Risks discussed.  He was then prepped and draped in a sterile fashion.    Pre-Procedure: -A dorsal penile and ring block were administered using 30 cc 1% lidocaine.  The surgical site was then tested and adequate anesthesia was achieved.    Procedure: -Two circumferential incision were made on the penile skin, one in the mid shaft and another approximately 1 cm proximal to the coronal margin using a blade. -The foreskin was then removed in a sleeve-like fashion with care taken to avoid injury to the urethra.  - Bleeding points were cauterized and adequate hemostasis was achieved - Skin margins were reapproximated with interrupted 3-0 chromic catgut sutures. -A dressing of Xeroform and gauze was applied.     Post-Procedure: -RTC in 4 weeks for wound check   Assessment & Plan:    1. Recurrent balanitis Status post uncomplicated circumcision today Post care instructions reviewed in detail Warning signs reviewed Follow-up in 4 weeks for wound check   Return in about 4 weeks (around 12/08/2015) for wound check with [facility].  Hollice Espy, MD  [facility] [address] [place],  [address] [phone]

## 2015-11-14 ENCOUNTER — Telehealth: Payer: Self-pay

## 2015-11-14 NOTE — Telephone Encounter (Signed)
Pt called stating he was in a lot of discomfort post circumcision and wanted to know if he should reapply a bandage. Nurse made pt aware not to reapply bandage and can apply bacitracin around stitches to relieve some discomfort. Reinforced with pt skin was removed that has been intact for several years and discomfort can be expected for a while. Pt voiced understanding.

## 2015-11-21 ENCOUNTER — Telehealth: Payer: Self-pay | Admitting: Urology

## 2015-11-21 NOTE — Telephone Encounter (Signed)
Patient had a circumcision with Dr. Erlene Quan.  He has some questions that he would like to discuss with a nurse.  He would also like Fluconazole 200mg  called into the [facility] on [address] in [place], [place] for yeast infection.

## 2015-11-21 NOTE — Telephone Encounter (Signed)
Pt called stating the head of his penis is very red and sore. Pt wanted to know if this was yeast related or if its ok for him to proceed with circumcision. Please advise.

## 2015-11-21 NOTE — Telephone Encounter (Signed)
LMOM

## 2015-11-22 ENCOUNTER — Ambulatory Visit (INDEPENDENT_AMBULATORY_CARE_PROVIDER_SITE_OTHER): Payer: 59 | Admitting: Urology

## 2015-11-22 ENCOUNTER — Encounter: Payer: Self-pay | Admitting: Urology

## 2015-11-22 VITALS — BP 132/79 | HR 97 | Ht 70.0 in | Wt 271.4 lb

## 2015-11-22 DIAGNOSIS — N481 Balanitis: Secondary | ICD-10-CM | POA: Diagnosis not present

## 2015-11-22 MED ORDER — FLUCONAZOLE 200 MG PO TABS: 200.0000 mg | ORAL_TABLET | Freq: Every day | ORAL | Status: DC

## 2015-11-22 NOTE — Progress Notes (Signed)
11/22/2015 4:52 PM   Johnny Maddox [birth year] [id number]  Referring provider: Wardell Honour, MD [address] [place], [place] [address]  Chief Complaint  Patient presents with  . Follow-up    wound recheck after circ    HPI: Patient is a 60 year old African-American male who underwent an office circumcision on 11/10/2015 with Dr. Erlene Quan who is fearful he may have developed a yeast infection and would like [place] to take a look at the site.  He is very anxious at today's appointment. He feels that things are nonhealing correctly. He has noticed a white discharge around the coronal surface of his penis. He has not had any foul discharge or serosanguineous discharge from the circumcision site.  He states he is had some swelling and redness on his penis, but is starting to improve.     PMH: Past Medical History  Diagnosis Date  . Hyperlipidemia   . Hypertension   . Acute laryngitis, without mention of obstruction   . Unspecified disorder of autonomic nervous system   . Unspecified vitamin D deficiency   . Chalazion   . Cough   . Allergic rhinitis, cause unspecified   . Unspecified venous (peripheral) insufficiency   . Dermatophytosis of foot   . Seborrheic dermatitis, unspecified   . Unspecified sleep apnea   . Microalbuminuria   . Overweight   . Elevated PSA     Surgical History: Past Surgical History  Procedure Laterality Date  . Rotator cuff repair  R 2011, L 2006    L and R shoulder  . Tonsillectomy and adenoidectomy      Home Medications:    Medication List       This list is accurate as of: 11/22/15  4:52 PM.  Always use your most recent med list.               amLODipine 10 MG tablet  Commonly known as:  NORVASC  TAKE ONE TABLET BY MOUTH ONE TIME DAILY     aspirin 81 MG tablet  Take 81 mg by mouth 3 (three) times daily. Reported on 11/10/2015     atorvastatin 20 MG tablet  Commonly known as:  LIPITOR  Take 20 mg by mouth at bedtime.     CIALIS  20 MG tablet  Generic drug:  tadalafil  Take by mouth.     finasteride 5 MG tablet  Commonly known as:  PROSCAR     fluconazole 200 MG tablet  Commonly known as:  DIFLUCAN  Take 1 tablet (200 mg total) by mouth daily.     furosemide 20 MG tablet  Commonly known as:  LASIX  Take 20 mg by mouth daily. Reported on 11/22/2015     glimepiride 1 MG tablet  Commonly known as:  AMARYL  TAKE 1 TABLET (1 MG TOTAL) BY MOUTH DAILY WITH BREAKFAST.     HYDROcodone-acetaminophen 5-325 MG tablet  Commonly known as:  NORCO/VICODIN  Take 1-2 tablets by mouth every 6 (six) hours as needed for moderate pain.     metFORMIN 1000 MG tablet  Commonly known as:  GLUCOPHAGE  TAKE 1 TABLET (1,000 MG TOTAL) BY MOUTH 2 (TWO) TIMES DAILY WITH MEALS.     MULTIVITAMIN PO  Take by mouth daily.     nystatin ointment  Commonly known as:  MYCOSTATIN  Apply 1 application topically 2 (two) times daily.     ONETOUCH VERIO test strip  Generic drug:  glucose blood  Use 1 strip  via meter twice daily as directed.     STENDRA 200 MG Tabs  Generic drug:  Avanafil     TANZEUM 50 MG Pen  Generic drug:  Albiglutide  INJECT 50 MG SUBCUTANEOUSLY EVERY 7 DAYS        Allergies:  Allergies  Allergen Reactions  . Other Anaphylaxis    Mushrooms    Family History: Family History  Problem Relation Age of Onset  . Cancer Mother     leukemia  . Hypertension Mother   . Alcohol abuse Father   . Hypertension Brother   . Prostate cancer Neg Hx   . Bladder Cancer Neg Hx     Social History:  reports that he has quit smoking. He has never used smokeless tobacco. He reports that he drinks alcohol. He reports that he does not use illicit drugs.  ROS: UROLOGY Frequent Urination?: No Hard to postpone urination?: No Burning/pain with urination?: No Get up at night to urinate?: No Leakage of urine?: No Urine stream starts and stops?: No Trouble starting stream?: No Do you have to strain to urinate?: No Blood  in urine?: No Urinary tract infection?: No Sexually transmitted disease?: No Injury to kidneys or bladder?: No Painful intercourse?: No Weak stream?: No Erection problems?: No Penile pain?: No  Gastrointestinal Nausea?: No Vomiting?: No Indigestion/heartburn?: No Diarrhea?: No Constipation?: No  Constitutional Fever: No Night sweats?: No Weight loss?: No Fatigue?: No  Skin Skin rash/lesions?: No Itching?: Yes  Eyes Blurred vision?: No Double vision?: No  Ears/Nose/Throat Sore throat?: No Sinus problems?: No  Hematologic/Lymphatic Swollen glands?: No Easy bruising?: No  Cardiovascular Leg swelling?: No Chest pain?: No  Respiratory Cough?: No Shortness of breath?: No  Endocrine Excessive thirst?: No  Musculoskeletal Back pain?: No Joint pain?: No  Neurological Headaches?: No Dizziness?: No  Psychologic Depression?: No Anxiety?: No  Physical Exam: BP 132/79 mmHg  Pulse 97  Ht 5\' 10"  (1.778 m)  Wt 271 lb 6.4 oz (123.106 kg)  BMI 38.94 kg/m2  GU: No CVA tenderness.  No bladder fullness or masses.  Patient with circumcised phallus.  Sutures are in place.   There is no erythema, edema or drainage.  Urethral meatus is patent.  No penile discharge. No penile lesions or rashes. I did not see any stitch discharge during the exam, but patient states he has been fastidious with cleansing the head of the glans and there was a white discharge this morning. Scrotum without lesions, cysts, rashes and/or edema.  Testicles are located scrotally bilaterally. No masses are appreciated in the testicles. Left and right epididymis are normal.  Laboratory Data: Lab Results  Component Value Date   WBC 5.5 12/28/2012   HGB 14.1 12/28/2012   HCT 42.7 12/28/2012   MCV 81.6 12/28/2012   PLT 152 12/28/2012    Lab Results  Component Value Date   CREATININE 0.99 12/28/2012    Lab Results  Component Value Date   HGBA1C 7.8 09/28/2015    Assessment & Plan:     1. Balanitis:   I reassured the patient that his circumcision is healing well.  I did not see any signs of infection.  I did send a prescription for fluconazole to the pharmacy due to the patient's description of a yeasty discharge in the morning.  - fluconazole (DIFLUCAN) 200 MG tablet; Take 1 tablet (200 mg total) by mouth daily.  Dispense: 7 tablet; Refill: 0  2. History of elevated PSA: Patient's PSA has reduced from 4.6 ng/mL in  07/2014 to 2.5 ng/mL on 10/11/2015 on finasteride. He will return in April for a PSA and exam.  3. BPH with obstruction/lower urinary tract symptoms: IPSS score is 13/1. We will continue to monitor. He will return in April for a IPSS score, PSA and exam.   4. Erectile dysfunction: We will table this issue until after his circumcision. He will be returning in April for a SHIM and exam.  5. Hydroceles: Patient would like to undergo hydrocelectomy in the future. We will discuss this further at his appointment in 6 months.    Return for keep the appointment on the 30th.  These notes generated with voice recognition software. I apologize for typographical errors.  Zara Council, [facility] [address], [place], [place] [address] [phone]

## 2015-11-22 NOTE — Patient Instructions (Signed)
Do not take the Lipitor while on the fluconazole

## 2015-11-22 NOTE — Telephone Encounter (Signed)
Spoke with pt in reference to wound check. Pt voiced understanding. Pt was transferred to the front to make appt for today.

## 2015-11-22 NOTE — Telephone Encounter (Signed)
No, please have him come into the office for a wound check today.    Hollice Espy, MD

## 2015-11-27 ENCOUNTER — Encounter: Payer: 59 | Admitting: Family Medicine

## 2015-12-05 ENCOUNTER — Encounter: Payer: Self-pay | Admitting: Family Medicine

## 2015-12-05 ENCOUNTER — Ambulatory Visit (INDEPENDENT_AMBULATORY_CARE_PROVIDER_SITE_OTHER): Payer: 59 | Admitting: Family Medicine

## 2015-12-05 VITALS — BP 123/80 | HR 94 | Temp 98.5°F | Resp 17 | Ht 69.25 in | Wt 265.0 lb

## 2015-12-05 DIAGNOSIS — Z0000 Encounter for general adult medical examination without abnormal findings: Secondary | ICD-10-CM

## 2015-12-05 DIAGNOSIS — Z6838 Body mass index (BMI) 38.0-38.9, adult: Secondary | ICD-10-CM

## 2015-12-05 DIAGNOSIS — Z23 Encounter for immunization: Secondary | ICD-10-CM

## 2015-12-05 DIAGNOSIS — R0982 Postnasal drip: Secondary | ICD-10-CM

## 2015-12-05 NOTE — Patient Instructions (Addendum)
You can try over the counter zyrtec (generic is fine) daily for your post nasal drainage.      Why follow it? Research shows. . Those who follow the Mediterranean diet have a reduced risk of heart disease  . The diet is associated with a reduced incidence of Parkinson's and Alzheimer's diseases . People following the diet may have longer life expectancies and lower rates of chronic diseases  . The Dietary Guidelines for Americans recommends the Mediterranean diet as an eating plan to promote health and prevent disease  What Is the Mediterranean Diet?  . Healthy eating plan based on typical foods and recipes of Mediterranean-style cooking . The diet is primarily a plant based diet; these foods should make up a majority of meals   Starches - Plant based foods should make up a majority of meals - They are an important sources of vitamins, minerals, energy, antioxidants, and fiber - Choose whole grains, foods high in fiber and minimally processed items  - Typical grain sources include wheat, oats, barley, corn, brown rice, bulgar, farro, millet, polenta, couscous  - Various types of beans include chickpeas, lentils, fava beans, black beans, white beans   Fruits  Veggies - Large quantities of antioxidant rich fruits & veggies; 6 or more servings  - Vegetables can be eaten raw or lightly drizzled with oil and cooked  - Vegetables common to the traditional Mediterranean Diet include: artichokes, arugula, beets, broccoli, brussel sprouts, cabbage, carrots, celery, collard greens, cucumbers, eggplant, kale, leeks, lemons, lettuce, mushrooms, okra, onions, peas, peppers, potatoes, pumpkin, radishes, rutabaga, shallots, spinach, sweet potatoes, turnips, zucchini - Fruits common to the Mediterranean Diet include: apples, apricots, avocados, cherries, clementines, dates, figs, grapefruits, grapes, melons, nectarines, oranges, peaches, pears, pomegranates, strawberries, tangerines  Fats - Replace butter and  margarine with healthy oils, such as olive oil, canola oil, and tahini  - Limit nuts to no more than a handful a day  - Nuts include walnuts, almonds, pecans, pistachios, pine nuts  - Limit or avoid candied, honey roasted or heavily salted nuts - Olives are central to the [employer] - can be eaten whole or used in a variety of dishes   Meats Protein - Limiting red meat: no more than a few times a month - When eating red meat: choose lean cuts and keep the portion to the size of deck of cards - Eggs: approx. 0 to 4 times a week  - Fish and lean poultry: at least 2 a week  - Healthy protein sources include, chicken, [place], lean beef, lamb - Increase intake of seafood such as tuna, salmon, trout, mackerel, shrimp, scallops - Avoid or limit high fat processed meats such as sausage and bacon  Dairy - Include moderate amounts of low fat dairy products  - Focus on healthy dairy such as fat free yogurt, skim milk, low or reduced fat cheese - Limit dairy products higher in fat such as whole or 2% milk, cheese, ice cream  Alcohol - Moderate amounts of red wine is ok  - No more than 5 oz daily for women (all ages) and men older than age 33  - No more than 10 oz of wine daily for men younger than 64  Other - Limit sweets and other desserts  - Use herbs and spices instead of salt to flavor foods  - Herbs and spices common to the traditional Mediterranean Diet include: basil, bay leaves, chives, cloves, cumin, fennel, garlic, lavender, marjoram, mint, oregano, parsley, pepper, rosemary,  sage, savory, sumac, tarragon, thyme   It's not just a diet, it's a lifestyle:  . The Mediterranean diet includes lifestyle factors typical of those in the region  . Foods, drinks and meals are best eaten with others and savored . Daily physical activity is important for overall good health . This could be strenuous exercise like running and aerobics . This could also be more leisurely activities such as  walking, housework, yard-work, or taking the stairs . Moderation is the key; a balanced and healthy diet accommodates most foods and drinks . Consider portion sizes and frequency of consumption of certain foods   Meal Ideas & Options:  . Breakfast:  o Whole wheat toast or whole wheat English muffins with peanut butter & hard boiled egg o Steel cut oats topped with apples & cinnamon and skim milk  o Fresh fruit: banana, strawberries, melon, berries, peaches  o Smoothies: strawberries, bananas, greek yogurt, peanut butter o Low fat greek yogurt with blueberries and granola  o Egg white omelet with spinach and mushrooms o Breakfast couscous: whole wheat couscous, apricots, skim milk, cranberries  . Sandwiches:  o Hummus and grilled vegetables (peppers, zucchini, squash) on whole wheat bread   o Grilled chicken on whole wheat pita with lettuce, tomatoes, cucumbers or tzatziki  o Tuna salad on whole wheat bread: tuna salad made with greek yogurt, olives, red peppers, capers, green onions o Garlic rosemary lamb pita: lamb sauted with garlic, rosemary, salt & pepper; add lettuce, cucumber, greek yogurt to pita - flavor with lemon juice and black pepper  . Seafood:  o Mediterranean grilled salmon, seasoned with garlic, basil, parsley, lemon juice and black pepper o Shrimp, lemon, and spinach whole-grain pasta salad made with low fat greek yogurt  o Seared scallops with lemon orzo  o Seared tuna steaks seasoned salt, pepper, coriander topped with tomato mixture of olives, tomatoes, olive oil, minced garlic, parsley, green onions and cappers  . Meats:  o Herbed greek chicken salad with kalamata olives, cucumber, feta  o Red bell peppers stuffed with spinach, bulgur, lean ground beef (or lentils) & topped with feta   o Kebabs: skewers of chicken, tomatoes, onions, zucchini, squash  o [place] burgers: made with red onions, mint, dill, lemon juice, feta cheese topped with roasted red  peppers . Vegetarian o Cucumber salad: cucumbers, artichoke hearts, celery, red onion, feta cheese, tossed in olive oil & lemon juice  o Hummus and whole grain pita points with a greek salad (lettuce, tomato, feta, olives, cucumbers, red onion) o Lentil soup with celery, carrots made with vegetable broth, garlic, salt and pepper  o Tabouli salad: parsley, bulgur, mint, scallions, cucumbers, tomato, radishes, lemon juice, olive oil, salt and pepper.

## 2015-12-05 NOTE — Progress Notes (Signed)
   Subjective:    Patient ID: Johnny Maddox, male    DOB: [birth year], 60 y.o.   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    He has a history of DM and is seen by endocrinology at [facility]. He has several urologic problems and is see by [facility]. 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 Has done well since.     Last CPE- over a year ago PSA- followed by urology Colonoscopy- a couple of years ago, thinks it was Dr. Benson [place], normal per patient, 10 year recall Tdap- will have today Flu- annual  Dental- regular Eye- has appointment next month, goes annually to Dr. Katy Fitch Exercise- has gym at work and does free weights. Has exercise bike at home.   Review of Systems  Constitutional: Negative.   HENT: Positive for postnasal drip.   Eyes: Negative.   Respiratory: Negative.   Cardiovascular: Negative.   Gastrointestinal: Negative.   Endocrine: Negative.   Genitourinary: Negative.   Musculoskeletal: Negative.   Skin: Negative.   Allergic/Immunologic: Negative.   Neurological: Negative.   Hematological: Negative.   Psychiatric/Behavioral: Negative.        Objective:   Physical Exam  Constitutional: He is oriented to person, place, and time. He appears well-developed and well-nourished.  HENT:  Head: Normocephalic and atraumatic.  Right Ear: Tympanic membrane, external ear and ear canal normal.  Left Ear: Tympanic membrane, external ear and ear canal normal.  Nose: Nose normal.  Mouth/Throat: Uvula is midline and oropharynx is clear and moist.  Eyes: Conjunctivae are normal.  Neck: Normal range of motion. Neck supple.  Cardiovascular: Normal rate, regular rhythm and normal heart sounds.   Pulmonary/Chest: Effort normal and breath sounds normal.  Abdominal: Soft. Bowel sounds are normal.  Musculoskeletal: Normal range of motion. He  exhibits no edema or tenderness.  Neurological: He is alert and oriented to person, place, and time.  Skin: Skin is warm and dry.  Psychiatric: He has a normal mood and affect. His behavior is normal. Judgment and thought content normal.  Vitals reviewed.   BP 123/80 mmHg  Pulse 94  Temp(Src) 98.5 F (36.9 C) (Oral)  Resp 17  Ht 5' 9.25" (1.759 m)  Wt 265 lb (120.203 kg)  BMI 38.85 kg/m2  SpO2 97% Wt Readings from Last 3 Encounters:  12/05/15 265 lb (120.203 kg)  11/22/15 271 lb 6.4 oz (123.106 kg)  11/10/15 270 lb (122.471 kg)       Assessment & Plan:  1. Annual physical exam - discussed healthy habits, need for adequate sleep, regular exercise, good food choices.  - He plans to continue follow up with his endocrinologist and urologist  2. Need for Tdap vaccination - Tdap vaccine greater than or equal to 7yo IM  3. Post-nasal drip - didn't want to try nasal spray, will try OTC long acting anti-histamine like Zyrtec.  4. BMI 38.0-38.9,adult - he is working on exercising more, discussed healthy food choices and importance of planning meals and snacks  - follow up in 1 year   Clarene Reamer, FNP-BC  [facility], [place]  12/05/2015 10:42 AM

## 2015-12-10 ENCOUNTER — Other Ambulatory Visit: Payer: Self-pay | Admitting: Urology

## 2015-12-11 ENCOUNTER — Ambulatory Visit (INDEPENDENT_AMBULATORY_CARE_PROVIDER_SITE_OTHER): Payer: 59 | Admitting: Urology

## 2015-12-11 ENCOUNTER — Encounter: Payer: Self-pay | Admitting: Urology

## 2015-12-11 VITALS — BP 131/85 | HR 94 | Ht 70.0 in | Wt 216.9 lb

## 2015-12-11 DIAGNOSIS — Z09 Encounter for follow-up examination after completed treatment for conditions other than malignant neoplasm: Secondary | ICD-10-CM | POA: Diagnosis not present

## 2015-12-11 DIAGNOSIS — N529 Male erectile dysfunction, unspecified: Secondary | ICD-10-CM

## 2015-12-11 DIAGNOSIS — N528 Other male erectile dysfunction: Secondary | ICD-10-CM | POA: Diagnosis not present

## 2015-12-11 MED ORDER — SILDENAFIL CITRATE 100 MG PO TABS: 100.0000 mg | ORAL_TABLET | Freq: Every day | ORAL | Status: DC | PRN

## 2015-12-11 NOTE — Progress Notes (Signed)
9:19 AM   Johnny Maddox [birth year] [id number]  Referring provider: Wardell Honour, MD [address] [place], [place] [address]  Chief Complaint  Patient presents with  . Follow-up    wound check after Circ    HPI: Patient is a 60 year old African-American male who presents today for a recheck on his circumcision.  Previous history Patient is a 61 year old African-American male who underwent an office circumcision on 11/10/2015 with Dr. Erlene Quan who is fearful he may have developed a yeast infection.  He presented to the office on 11/22/2015 for an exam.  He was very anxious at that appointment.  He felt that things were not healing correctly.  He  noted a white discharge around the coronal surface of his penis in the mornings.  He is a diabetic.  He did not have any foul discharge or serosanguineous discharge from the circumcision site.  He stated he is had some swelling and redness on his penis, but it was starting to improve.   His exam was benign at that visit. He was reassured that circumcision was healing in the correct fashion. He was given a prescription for fluconazole because of the complaint of the white discharge in the mornings.  Today, he feels the circumcision has fully healed. He is no longer having the white discharge at the coronal surface of the penis. He is no longer having any swelling or pain in the penis. He is anxious to get back to sexual activity.  PMH: Past Medical History  Diagnosis Date  . Hyperlipidemia   . Hypertension   . Acute laryngitis, without mention of obstruction   . Unspecified disorder of autonomic nervous system   . Unspecified vitamin D deficiency   . Chalazion   . Cough   . Allergic rhinitis, cause unspecified   . Unspecified venous (peripheral) insufficiency   . Dermatophytosis of foot   . Seborrheic dermatitis, unspecified   . Unspecified sleep apnea   . Microalbuminuria   . Overweight   . Elevated PSA   . Diabetes mellitus  without complication [facility])     Surgical History: Past Surgical History  Procedure Laterality Date  . Rotator cuff repair  R 2011, L 2006    L and R shoulder  . Tonsillectomy and adenoidectomy      Home Medications:    Medication List       This list is accurate as of: 12/11/15  9:19 AM.  Always use your most recent med list.               amLODipine 10 MG tablet  Commonly known as:  NORVASC  TAKE ONE TABLET BY MOUTH ONE TIME DAILY     aspirin 81 MG tablet  Take 81 mg by mouth 3 (three) times daily. Reported on 11/10/2015     atorvastatin 20 MG tablet  Commonly known as:  LIPITOR  Take 20 mg by mouth at bedtime.     CIALIS 20 MG tablet  Generic drug:  tadalafil  Take by mouth.     finasteride 5 MG tablet  Commonly known as:  PROSCAR     fluconazole 200 MG tablet  Commonly known as:  DIFLUCAN  TAKE 1 TABLET (200 MG TOTAL) BY MOUTH DAILY.     furosemide 20 MG tablet  Commonly known as:  LASIX  Take 20 mg by mouth daily. Reported on 11/22/2015     glimepiride 1 MG tablet  Commonly known as:  AMARYL  TAKE 1 TABLET (1 MG TOTAL) BY MOUTH DAILY WITH BREAKFAST.     metFORMIN 1000 MG tablet  Commonly known as:  GLUCOPHAGE  TAKE 1 TABLET (1,000 MG TOTAL) BY MOUTH 2 (TWO) TIMES DAILY WITH MEALS.     MULTIVITAMIN PO  Take by mouth daily.     ONETOUCH VERIO test strip  Generic drug:  glucose blood  Use 1 strip via meter twice daily as directed.     STENDRA 200 MG Tabs  Generic drug:  Avanafil     TANZEUM 50 MG Pen  Generic drug:  Albiglutide  INJECT 50 MG SUBCUTANEOUSLY EVERY 7 DAYS        Allergies:  Allergies  Allergen Reactions  . Other Anaphylaxis    Mushrooms    Family History: Family History  Problem Relation Age of Onset  . Cancer Mother     leukemia  . Hypertension Mother   . Alcohol abuse Father   . Hypertension Brother   . Prostate cancer Neg Hx   . Bladder Cancer Neg Hx     Social History:  reports that he has quit smoking. He has  never used smokeless tobacco. He reports that he drinks alcohol. He reports that he does not use illicit drugs.  ROS: UROLOGY Frequent Urination?: No Hard to postpone urination?: No Burning/pain with urination?: No Get up at night to urinate?: No Leakage of urine?: No Urine stream starts and stops?: No Trouble starting stream?: No Do you have to strain to urinate?: No Blood in urine?: No Urinary tract infection?: No Sexually transmitted disease?: No Injury to kidneys or bladder?: No Painful intercourse?: No Weak stream?: No Erection problems?: No Penile pain?: No  Gastrointestinal Nausea?: No Vomiting?: No Indigestion/heartburn?: No Diarrhea?: No Constipation?: No  Constitutional Fever: No Night sweats?: No Weight loss?: No Fatigue?: No  Skin Skin rash/lesions?: No Itching?: No  Eyes Blurred vision?: No Double vision?: No  Ears/Nose/Throat Sore throat?: No Sinus problems?: No  Hematologic/Lymphatic Swollen glands?: No Easy bruising?: No  Cardiovascular Leg swelling?: No Chest pain?: No  Respiratory Cough?: No Shortness of breath?: No  Endocrine Excessive thirst?: No  Musculoskeletal Back pain?: No Joint pain?: No  Neurological Headaches?: No Dizziness?: No  Psychologic Depression?: No Anxiety?: No  Physical Exam: BP 131/85 mmHg  Pulse 94  Ht 5\' 10"  (1.778 m)  Wt 216 lb 14.4 oz (98.385 kg)  BMI 31.12 kg/m2  GU: No CVA tenderness.  No bladder fullness or masses.  Patient with circumcised phallus.  Sutures are dissolved.   There is no erythema, edema or drainage.  Urethral meatus is patent.  No penile discharge. No penile lesions or rashes.    Laboratory Data: Lab Results  Component Value Date   WBC 5.5 12/28/2012   HGB 14.1 12/28/2012   HCT 42.7 12/28/2012   MCV 81.6 12/28/2012   PLT 152 12/28/2012    Lab Results  Component Value Date   CREATININE 0.99 12/28/2012    Lab Results  Component Value Date   HGBA1C 7.8  09/28/2015    Assessment & Plan:    1. S/P circumcision:   Patient's circumcision is fully healed. He is released to full activity.  2. History of elevated PSA: Patient's PSA has reduced from 4.6 ng/mL in 07/2014 to 2.5 ng/mL on 10/11/2015 on finasteride. He will return in April for a PSA and exam.  3. BPH with obstruction/lower urinary tract symptoms: IPSS score is 13/1. We will continue to monitor. He will return in April/May  for a IPSS score, PSA and exam.   4. Erectile dysfunction:  He will be returning in April/May for a SHIM and exam.  He is given a sample of Viagra 100 mg. He is advised to take it 2 hours prior to intercourse on an empty stomach. I have sent a prescription to his pharmacy for the Viagra. I have also given him a coupon to help offset the cost.  5. Hydroceles: Patient would like to undergo hydrocelectomy in the future. We will discuss this further at his appointment in 6 months.    Return in about 3 months (around 03/10/2016) for IPSS score, SHIM score and exam.  These notes generated with voice recognition software. I apologize for typographical errors.  Zara Council, [facility] [address], [place], [place] [address] [phone]

## 2016-02-27 ENCOUNTER — Telehealth: Payer: Self-pay | Admitting: Urology

## 2016-02-27 DIAGNOSIS — N529 Male erectile dysfunction, unspecified: Secondary | ICD-10-CM

## 2016-02-27 MED ORDER — TADALAFIL 20 MG PO TABS: 20.0000 mg | ORAL_TABLET | Freq: Every day | ORAL | Status: DC | PRN

## 2016-02-27 NOTE — Telephone Encounter (Signed)
Spoke with pt in reference to cialis. Pt stated that the viagra did not work as well at the [facility] [employer] Reinforced with pt to keep f/u appt in may. Pt voiced understanding and a refill was given.

## 2016-02-27 NOTE — Telephone Encounter (Signed)
Johnny Maddox called saying he's completely out of Cialis and he's wondering if a refill can be sent to his pharmacy at [facility] in [facility] in [place]. Please call the pt if needed.  Pt's ph# [phone] Thank you.

## 2016-04-03 ENCOUNTER — Other Ambulatory Visit: Payer: 59

## 2016-04-03 DIAGNOSIS — R972 Elevated prostate specific antigen [PSA]: Secondary | ICD-10-CM

## 2016-04-04 LAB — PSA: Prostate Specific Ag, Serum: 4.4 ng/mL — ABNORMAL HIGH (ref 0.0–4.0)

## 2016-04-10 ENCOUNTER — Encounter: Payer: Self-pay | Admitting: Urology

## 2016-04-10 ENCOUNTER — Ambulatory Visit (INDEPENDENT_AMBULATORY_CARE_PROVIDER_SITE_OTHER): Payer: 59 | Admitting: Urology

## 2016-04-10 VITALS — BP 141/83 | HR 101 | Ht 70.0 in | Wt 275.9 lb

## 2016-04-10 DIAGNOSIS — N528 Other male erectile dysfunction: Secondary | ICD-10-CM | POA: Diagnosis not present

## 2016-04-10 DIAGNOSIS — N138 Other obstructive and reflux uropathy: Secondary | ICD-10-CM

## 2016-04-10 DIAGNOSIS — Z87898 Personal history of other specified conditions: Secondary | ICD-10-CM

## 2016-04-10 DIAGNOSIS — N401 Enlarged prostate with lower urinary tract symptoms: Secondary | ICD-10-CM | POA: Diagnosis not present

## 2016-04-10 DIAGNOSIS — N529 Male erectile dysfunction, unspecified: Secondary | ICD-10-CM

## 2016-04-10 DIAGNOSIS — N433 Hydrocele, unspecified: Secondary | ICD-10-CM

## 2016-04-10 MED ORDER — SILDENAFIL CITRATE 100 MG PO TABS: 100.0000 mg | ORAL_TABLET | Freq: Every day | ORAL | Status: DC | PRN

## 2016-04-10 MED ORDER — FINASTERIDE 5 MG PO TABS: 5.0000 mg | ORAL_TABLET | Freq: Every day | ORAL | Status: DC

## 2016-04-10 NOTE — Progress Notes (Signed)
04/10/2016 9:19 AM   [name] 11/14/1955 [id number]  Referring provider: Wardell Honour, MD [address] [place], [place] [address]  Chief Complaint  Patient presents with  . Follow-up    elevated PSA,     HPI: Patient is a 60 year old African-American male with a history of elevated PSA, BPH with LUTS, erectile dysfunction and hydrocele who presents today for a follow-up visit.  History of elevated PSA Patient's PSA has reduced from 4.6 ng/mL in 07/2014 to 2.5 ng/mL on 10/11/2015 on finasteride. Patient discontinued the finasteride for unknown reasons and his recent PSA returned at 4.4 ng/mL on 04/03/2016.  We will repeat that value today.  BPH WITH LUTS His IPSS score today is 9, which is moderate lower urinary tract symptomatology. He is pleased with his quality life due to his urinary symptoms. He denies any dysuria, hematuria or suprapubic pain.   He also denies any recent fevers, chills, nausea or vomiting.  He does not have a family history of PCa.      IPSS      04/10/16 0900       International Prostate Symptom Score   How often have you had the sensation of not emptying your bladder? Less than 1 in 5     How often have you had to urinate less than every two hours? Less than half the time     How often have you found you stopped and started again several times when you urinated? Less than 1 in 5 times     How often have you found it difficult to postpone urination? About half the time     How often have you had a weak urinary stream? Not at All     How often have you had to strain to start urination? Not at All     How many times did you typically get up at night to urinate? 2 Times     Total IPSS Score 9     Quality of Life due to urinary symptoms   If you were to spend the rest of your life with your urinary condition just the way it is now how would you feel about that? Pleased        Score:  1-7 Mild 8-19 Moderate 20-35 Severe  Erectile  dysfunction His SHIM score is 12, which is mild to moderate erectile dysfunction.   He has been having difficulty with erections for over 3 years .   His major complaint is maintaining an erection for the completion of satisfactory intercourse.  His libido is preserved.   His risk factors for ED are DM, age, BPH, HTN and vascular disease.  He denies any painful erections or curvatures with his erections.   He has tried PDE5-inhibitors in the past and found them either hit or miss and efficacy. He is still having spontaneous erections.      SHIM      04/10/16 0902       SHIM: Over the last 6 months:   How do you rate your confidence that you could get and keep an erection? Low     When you had erections with sexual stimulation, how often were your erections hard enough for penetration (entering your partner)? A Few Times (much less than half the time)     During sexual intercourse, how often were you able to maintain your erection after you had penetrated (entered) your partner? Very Difficult     During sexual  intercourse, how difficult was it to maintain your erection to completion of intercourse? Difficult     When you attempted sexual intercourse, how often was it satisfactory for you? Difficult     SHIM Total Score   SHIM 12        Score: 1-7 Severe ED 8-11 Moderate ED 12-16 Mild-Moderate ED 17-21 Mild ED 22-25 No ED   PMH: Past Medical History  Diagnosis Date  . Hyperlipidemia   . Hypertension   . Acute laryngitis, without mention of obstruction   . Unspecified disorder of autonomic nervous system   . Unspecified vitamin D deficiency   . Chalazion   . Cough   . Allergic rhinitis, cause unspecified   . Unspecified venous (peripheral) insufficiency   . Dermatophytosis of foot   . Seborrheic dermatitis, unspecified   . Unspecified sleep apnea   . Microalbuminuria   . Overweight   . Elevated PSA   . Diabetes mellitus without complication [facility])     Surgical  History: Past Surgical History  Procedure Laterality Date  . Rotator cuff repair  R 2011, L 2006    L and R shoulder  . Tonsillectomy and adenoidectomy      Home Medications:    Medication List       This list is accurate as of: 04/10/16  9:19 AM.  Always use your most recent med list.               amLODipine 10 MG tablet  Commonly known as:  NORVASC  TAKE ONE TABLET BY MOUTH ONE TIME DAILY     aspirin 81 MG tablet  Take 81 mg by mouth 3 (three) times daily. Reported on 11/10/2015     atorvastatin 20 MG tablet  Commonly known as:  LIPITOR  Take 20 mg by mouth at bedtime.     B-D ULTRAFINE III SHORT PEN 31G X 8 MM Misc  Generic drug:  Insulin Pen Needle  See admin instructions.     FIFTY50 GLUCOSE METER 2.0 w/Device Kit     finasteride 5 MG tablet  Commonly known as:  PROSCAR  Take 1 tablet (5 mg total) by mouth daily.     furosemide 20 MG tablet  Commonly known as:  LASIX  Take 20 mg by mouth daily. Reported on 11/22/2015     glimepiride 1 MG tablet  Commonly known as:  AMARYL  TAKE 1 TABLET (1 MG TOTAL) BY MOUTH DAILY WITH BREAKFAST.     LANTUS SOLOSTAR 100 UNIT/ML Solostar Pen  Generic drug:  Insulin Glargine  INJECT 40 UNITS SUBCUTANEOUSLY ONCE DAILY.     metFORMIN 1000 MG tablet  Commonly known as:  GLUCOPHAGE  TAKE 1 TABLET (1,000 MG TOTAL) BY MOUTH 2 (TWO) TIMES DAILY WITH MEALS.     MULTI-VITAMINS Tabs  Take by mouth.     MULTIVITAMIN PO  Take by mouth daily.     ONETOUCH VERIO test strip  Generic drug:  glucose blood  Use 1 strip via meter twice daily as directed.     sildenafil 100 MG tablet  Commonly known as:  VIAGRA  Take 1 tablet (100 mg total) by mouth daily as needed for erectile dysfunction. Take two hours prior to intercourse on an empty stomach     tadalafil 20 MG tablet  Commonly known as:  CIALIS  Take 1 tablet (20 mg total) by mouth daily as needed for erectile dysfunction.     TANZEUM 50 MG Pen  Generic drug:  Albiglutide   INJECT 50 MG SUBCUTANEOUSLY EVERY 7 DAYS        Allergies:  Allergies  Allergen Reactions  . Other Anaphylaxis    Mushrooms    Family History: Family History  Problem Relation Age of Onset  . Cancer Mother     leukemia  . Hypertension Mother   . Alcohol abuse Father   . Hypertension Brother   . Prostate cancer Neg Hx   . Bladder Cancer Neg Hx     Social History:  reports that he has quit smoking. He has never used smokeless tobacco. He reports that he drinks alcohol. He reports that he does not use illicit drugs.  ROS: UROLOGY Frequent Urination?: No Hard to postpone urination?: No Burning/pain with urination?: No Get up at night to urinate?: No Leakage of urine?: No Urine stream starts and stops?: No Trouble starting stream?: No Do you have to strain to urinate?: No Blood in urine?: No Urinary tract infection?: No Sexually transmitted disease?: No Injury to kidneys or bladder?: No Painful intercourse?: No Weak stream?: No Erection problems?: Yes Penile pain?: No  Gastrointestinal Nausea?: No Vomiting?: No Indigestion/heartburn?: No Diarrhea?: No Constipation?: No  Constitutional Fever: No Night sweats?: No Weight loss?: No Fatigue?: No  Skin Skin rash/lesions?: No Itching?: No  Eyes Blurred vision?: No Double vision?: No  Ears/Nose/Throat Sore throat?: No Sinus problems?: No  Hematologic/Lymphatic Swollen glands?: No Easy bruising?: No  Cardiovascular Leg swelling?: No Chest pain?: No  Respiratory Cough?: No Shortness of breath?: No  Endocrine Excessive thirst?: No  Musculoskeletal Back pain?: No Joint pain?: No  Neurological Headaches?: No Dizziness?: No  Psychologic Depression?: No Anxiety?: No  Physical Exam: BP 141/83 mmHg  Pulse 101  Ht 5' 10"  (1.778 m)  Wt 275 lb 14.4 oz (125.147 kg)  BMI 39.59 kg/m2  Constitutional: Well nourished. Alert and oriented, No acute distress. HEENT:  AT, moist mucus  membranes. Trachea midline, no masses. Cardiovascular: No clubbing, cyanosis, or edema. Respiratory: Normal respiratory effort, no increased work of breathing. GI: Abdomen is soft, non tender, non distended, no abdominal masses. Liver and spleen not palpable.  No hernias appreciated.  Stool sample for occult testing is not indicated.   GU: No CVA tenderness.  No bladder fullness or masses.  Patient with circumcised phallus.  Urethral meatus is patent.  No penile discharge. No penile lesions or rashes. Scrotum without lesions, cysts, rashes and/or edema.  Testicles are located scrotally bilaterally. No masses are appreciated in the testicles. Left and right epididymis are normal. Left hydrocele > right hydrocele.   Rectal: Patient with  normal sphincter tone. Anus and perineum without scarring or rashes. No rectal masses are appreciated. Prostate is approximately 50 grams, no nodules are appreciated. Seminal vesicles are normal. Skin: No rashes, bruises or suspicious lesions. Lymph: No cervical or inguinal adenopathy. Neurologic: Grossly intact, no focal deficits, moving all 4 extremities. Psychiatric: Normal mood and affect.  Laboratory Data: Lab Results  Component Value Date   WBC 5.5 12/28/2012   HGB 14.1 12/28/2012   HCT 42.7 12/28/2012   MCV 81.6 12/28/2012   PLT 152 12/28/2012    Lab Results  Component Value Date   CREATININE 0.99 12/28/2012     Lab Results  Component Value Date   HGBA1C 7.8 09/28/2015   PSA History  2.6 ng/mL on 05/08/2015  2.5 ng/mL on 10/11/2015  4.4 ng/mL on 04/03/2016-patient stopped finasteride 6 months ago        Component Value Date/Time  CHOL 183 12/28/2012 1230   HDL 41 12/28/2012 1230   CHOLHDL 4.5 12/28/2012 1230   VLDL 26 12/28/2012 1230   LDLCALC 116* 12/28/2012 1230    Lab Results  Component Value Date   AST 21 12/28/2012   Lab Results  Component Value Date   ALT 29 12/28/2012       Assessment & Plan:    1. History of  elevated PSA:   Patient's PSA has returned to slightly elevated value after patient discontinued the finasteride 6 months ago. I have repeated a PSA today, but I suspect it will still be slightly elevated. Patient will restart the finasteride and return in 6 months for PSA and exam.  2. BPH with obstruction/lower urinary tract symptoms:   IPSS score is 9/1. He will restart the finasteride 5 mg daily. He will return in 6 months for I PSS score, PSA and exam.  - PSA  3. Erectile dysfunction: SHIM score is 12.  He is given a sample of Viagra 100 mg. He is advised to take it 2 hours prior to intercourse on an empty stomach. I have sent a prescription to his pharmacy for the Viagra.  He will return in 6 months for [facility] IM score and exam  4. Hydroceles: Patient would like to undergo hydrocelectomy in the future. We will discuss this further at his appointment in 6 months.   Return in about 6 months (around 10/10/2016) for IPSS, SHIM, PSA and exam.  These notes generated with voice recognition software. I apologize for typographical errors.  Zara Council, [facility] [address] [place], [place] [address] [phone]

## 2016-04-11 ENCOUNTER — Telehealth: Payer: Self-pay

## 2016-04-11 LAB — PSA: Prostate Specific Ag, Serum: 4 ng/mL (ref 0.0–4.0)

## 2016-04-11 NOTE — Telephone Encounter (Signed)
Spoke with pt in reference to PSA results. Pt voiced understanding.  

## 2016-04-11 NOTE — Telephone Encounter (Signed)
-----   Message from Nori Riis, PA-C sent at 04/11/2016  8:13 AM EDT ----- Patient has stopped his finasteride, so this explains the increase in his PSA.  We will see him in 6 months.

## 2016-04-16 ENCOUNTER — Encounter: Payer: Self-pay | Admitting: Family Medicine

## 2016-04-16 ENCOUNTER — Ambulatory Visit (INDEPENDENT_AMBULATORY_CARE_PROVIDER_SITE_OTHER): Payer: 59 | Admitting: Family Medicine

## 2016-04-16 VITALS — BP 130/85 | HR 93 | Temp 98.3°F | Resp 20 | Ht 70.0 in | Wt 272.8 lb

## 2016-04-16 DIAGNOSIS — H61893 Other specified disorders of external ear, bilateral: Secondary | ICD-10-CM | POA: Diagnosis not present

## 2016-04-16 DIAGNOSIS — R059 Cough, unspecified: Secondary | ICD-10-CM

## 2016-04-16 DIAGNOSIS — R05 Cough: Secondary | ICD-10-CM

## 2016-04-16 MED ORDER — BENZONATATE 100 MG PO CAPS: 100.0000 mg | ORAL_CAPSULE | Freq: Three times a day (TID) | ORAL | Status: DC | PRN

## 2016-04-16 MED ORDER — ALBUTEROL SULFATE HFA 108 (90 BASE) MCG/ACT IN AERS: 2.0000 | INHALATION_SPRAY | RESPIRATORY_TRACT | Status: DC | PRN

## 2016-04-16 MED ORDER — HYDROCOD POLST-CPM POLST ER 10-8 MG/5ML PO SUER: 5.0000 mL | Freq: Every evening | ORAL | Status: DC | PRN

## 2016-04-16 NOTE — Patient Instructions (Addendum)
Please use your inhaler every four hours for the next couple of days to see if it helps your cough. If it does, you can use every 4 hours as needed for cough and wheeze  If you are not doing better in 2 weeks or sooner if you get worse (fever, chills, SOB, wheezing that is not better)   For your ears, apply a small amount of over the counter hydrocortisone every day for 5-7 days   IF you received an x-ray today, you will receive an invoice from [facility]. Please contact [facility] at [phone] with questions or concerns regarding your invoice.   IF you received labwork today, you will receive an invoice from [employer]. Please contact [facility] at [phone] with questions or concerns regarding your invoice.   Our billing staff will not be able to assist you with questions regarding bills from these companies.  You will be contacted with the lab results as soon as they are available. The fastest way to get your results is to activate your My Chart account. Instructions are located on the last page of this paperwork. If you have not heard from [place] regarding the results in 2 weeks, please contact this office.

## 2016-04-16 NOTE — Progress Notes (Signed)
Subjective:    Patient ID: Johnny Maddox, male    DOB: Mar 23, 1956, 60 y.o.   MRN: [id number]  HPI   This is a pleasant 60 yo male who presents today with about 2 months. Started suddenly. Is occasionally productive, thick, yellow. He is occasionally SOB, occasionally hears wheezing. Feels a lot of post nasal drainage. 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 Has taken Claritin and chloratab with little relief. Nose mostly clear, some congestion and sinus headache over eyes. Cough disrupts sleep. No ear pain, but has noticed flaky ear canals. Has been instilling H2O2 and using OTC ear vacume. He has a history of eczema on arms. No current or past smoking history or asthma history.   Blood sugars around 100, up to 200 about once a week if he forgets to take metformin at night. Tolerating medications well. He is followed by endocrine.   Past Medical History  Diagnosis Date  . Hyperlipidemia   . Hypertension   . Acute laryngitis, without mention of obstruction   . Unspecified disorder of autonomic nervous system   . Unspecified vitamin D deficiency   . Chalazion   . Cough   . Allergic rhinitis, cause unspecified   . Unspecified venous (peripheral) insufficiency   . Dermatophytosis of foot   . Seborrheic dermatitis, unspecified   . Unspecified sleep apnea   . Microalbuminuria   . Overweight   . Elevated PSA   . Diabetes mellitus without complication [facility])    Past Surgical History  Procedure Laterality Date  . Rotator cuff repair  R 2011, L 2006    L and R shoulder  . Tonsillectomy and adenoidectomy     Family History  Problem Relation Age of Onset  . Cancer Mother     leukemia  . Hypertension Mother   . Alcohol abuse Father   . Hypertension Brother   . Prostate cancer Neg Hx   . Bladder Cancer Neg Hx    Social History  Substance Use Topics  . Smoking status: Former Research scientist (life sciences)  . Smokeless tobacco: Never Used     Comment: quit  over 40 years ago  . Alcohol Use: 0.0 - 0.6 oz/week    0-1 Cans of beer per week     Comment: paranoia about alcholism; alcohol makes me sleepy.     Review of Systems No fever or chills, no sore throat, no ear pain, some flaking of ears. + Cough, + post nasal drainage, + mild congestion, + headache over eyes, + occasional SOB, + occasional wheeze, no chest pain    Objective:   Physical Exam  Constitutional: He is oriented to person, place, and time. He appears well-developed and well-nourished.  obese  HENT:  Head: Normocephalic and atraumatic.  Right Ear: Tympanic membrane and external ear normal.  Left Ear: Tympanic membrane and external ear normal.  Nose: Mucosal edema and septal deviation present. Right sinus exhibits no maxillary sinus tenderness and no frontal sinus tenderness. Left sinus exhibits no maxillary sinus tenderness and no frontal sinus tenderness.  Mouth/Throat: Uvula is midline, oropharynx is clear and moist and mucous membranes are normal.  Bilateral canals with moderate amount small flakes.   Cardiovascular: Normal rate, regular rhythm and normal heart sounds.   Pulmonary/Chest: Effort normal and breath sounds normal.  Neurological: He is alert and oriented to person, place, and time.  Skin: Skin is warm and dry.  Psychiatric: He has a normal mood  and affect. His behavior is normal. Judgment and thought content normal.  Vitals reviewed.     BP 130/85 mmHg  Pulse 93  Temp(Src) 98.3 F (36.8 C) (Oral)  Resp 20  Ht 5\' 10"  (1.778 m)  Wt 272 lb 12.8 oz (123.741 kg)  BMI 39.14 kg/m2  SpO2 96% Wt Readings from Last 3 Encounters:  04/16/16 272 lb 12.8 oz (123.741 kg)  04/10/16 275 lb 14.4 oz (125.147 kg)  12/11/15 216 lb 14.4 oz (98.385 kg)- this reading is an error       Assessment & Plan:  1. Cough - suspect post viral cough vs. Seasonal allergies with post nasal drainage - encouraged him to try to prevent cough by drinking fluids, using cough  lozenges - albuterol (PROVENTIL HFA;VENTOLIN HFA) 108 (90 Base) MCG/ACT inhaler; Inhale 2 puffs into the lungs every 4 (four) hours as needed for wheezing or shortness of breath (cough, shortness of breath or wheezing.).  Dispense: 1 Inhaler; Refill: 1 - provided instructions for inhaler use - benzonatate (TESSALON) 100 MG capsule; Take 1-2 capsules (100-200 mg total) by mouth 3 (three) times daily as needed for cough.  Dispense: 40 capsule; Refill: 0 - chlorpheniramine-HYDROcodone (TUSSIONEX PENNKINETIC ER) 10-8 MG/5ML SUER; Take 5 mLs by mouth at bedtime as needed for cough.  Dispense: 70 mL; Refill: 0 - continue Claritin - he is to let me know if not significantly improved in 2 weeks, sooner if fever/chills, worsening symptoms.   2. Ear canal dryness, bilateral - encouraged him to stop H2O2 as this may be exacerbating dryness - he can apply a small amount of otc hydrocortisone cream daily for7 days.    Clarene Reamer, FNP-BC  [facility]  04/16/2016 1:09 PM

## 2016-04-23 ENCOUNTER — Ambulatory Visit: Payer: 59 | Admitting: Family Medicine

## 2016-05-08 ENCOUNTER — Encounter: Payer: Self-pay | Admitting: Physician Assistant

## 2016-05-08 ENCOUNTER — Ambulatory Visit (INDEPENDENT_AMBULATORY_CARE_PROVIDER_SITE_OTHER): Payer: 59 | Admitting: Physician Assistant

## 2016-05-08 ENCOUNTER — Encounter: Payer: Self-pay | Admitting: *Deleted

## 2016-05-08 ENCOUNTER — Encounter: Payer: 59 | Attending: Internal Medicine | Admitting: *Deleted

## 2016-05-08 ENCOUNTER — Ambulatory Visit (INDEPENDENT_AMBULATORY_CARE_PROVIDER_SITE_OTHER): Payer: 59

## 2016-05-08 VITALS — BP 128/90 | Ht 70.0 in | Wt 275.9 lb

## 2016-05-08 VITALS — BP 126/80 | HR 101 | Temp 97.7°F | Resp 18 | Ht 70.0 in | Wt 279.0 lb

## 2016-05-08 DIAGNOSIS — R059 Cough, unspecified: Secondary | ICD-10-CM

## 2016-05-08 DIAGNOSIS — E119 Type 2 diabetes mellitus without complications: Secondary | ICD-10-CM | POA: Diagnosis not present

## 2016-05-08 DIAGNOSIS — R05 Cough: Secondary | ICD-10-CM

## 2016-05-08 DIAGNOSIS — Z794 Long term (current) use of insulin: Secondary | ICD-10-CM

## 2016-05-08 MED ORDER — PANTOPRAZOLE SODIUM 40 MG PO TBEC: 40.0000 mg | DELAYED_RELEASE_TABLET | Freq: Every day | ORAL | Status: DC

## 2016-05-08 MED ORDER — AMOXICILLIN-POT CLAVULANATE 875-125 MG PO TABS: 1.0000 | ORAL_TABLET | Freq: Two times a day (BID) | ORAL | Status: AC

## 2016-05-08 MED ORDER — IPRATROPIUM BROMIDE 0.03 % NA SOLN: 2.0000 | Freq: Two times a day (BID) | NASAL | Status: DC

## 2016-05-08 NOTE — Progress Notes (Signed)
Diabetes Self-Management Education  Visit Type: First/Initial  Appt. Start Time: 0825 Appt. End Time: 0940  05/08/2016  [name], identified by name and date of birth, is a 60 y.o. male with a diagnosis of Diabetes: Type 2.   ASSESSMENT  Blood pressure 128/90, height 5\' 10"  (1.778 m), weight 275 lb 14.4 oz (125.147 kg). Body mass index is 39.59 kg/(m^2).      Diabetes Self-Management Education - 05/08/16 0959    Visit Information   Visit Type First/Initial   Initial Visit   Diabetes Type Type 2   Are you currently following a meal plan? No   Are you taking your medications as prescribed? Yes   Date Diagnosed 5+ years   Health Coping   How would you rate your overall health? Fair   Psychosocial Assessment   Patient Belief/Attitude about Diabetes Motivated to manage diabetes   Self-care barriers None   Self-management support Doctor's office;Friends   Patient Concerns Nutrition/Meal planning;Medication;Glycemic Control;Weight Control;Healthy Lifestyle   Preferred Learning Style Visual;Hands on   Learning Readiness Ready   How often do you need to have someone help you when you read instructions, pamphlets, or other written materials from your doctor or pharmacy? 1 - Never   What is the last grade level you completed in school? college graduate   Pre-Education Assessment   Patient understands the diabetes disease and treatment process. Needs Instruction   Patient understands incorporating nutritional management into lifestyle. Needs Instruction   Patient undertands incorporating physical activity into lifestyle. Needs Instruction   Patient understands using medications safely. Needs Review   Patient understands monitoring blood glucose, interpreting and using results Needs Review   Patient understands prevention, detection, and treatment of acute complications. Needs Instruction   Patient understands prevention, detection, and treatment of chronic complications. Needs  Instruction   Patient understands how to develop strategies to address psychosocial issues. Needs Instruction   Patient understands how to develop strategies to promote health/change behavior. Needs Instruction   Complications   Last HgB A1C per patient/outside source 12 %  02/05/16   How often do you check your blood sugar? 1-2 times/day   Fasting Blood glucose range (mg/dL) 70-129;130-179;180-200  Pt reports FBG's 90-190 mg/dL   Postprandial Blood glucose range (mg/dL) 70-129;130-179;180-200;>200  Pt reports pp's 110-200's mg/dL.   Have you had a dilated eye exam in the past 12 months? Yes   Have you had a dental exam in the past 12 months? Yes   Are you checking your feet? No   Dietary Intake   Breakfast usually skips or eats an apple; on weekends - sausage, eggs, grits   Snack (morning) popcorn, granola bar   Lunch [employer] salad or sandwich; cold cuts   Snack (afternoon) same as morning snack   Dinner hot dog, hamburger, corn   Beverage(s) water, diet soda   Exercise   Exercise Type Light (walking / raking leaves)  treadmil   How many days per week to you exercise? 2   How many minutes per day do you exercise? 30   Total minutes per week of exercise 60   Patient Education   Previous Diabetes Education No   Disease state  Definition of diabetes, type 1 and 2, and the diagnosis of diabetes   Nutrition management  Role of diet in the treatment of diabetes and the relationship between the three main macronutrients and blood glucose level;Carbohydrate counting   Physical activity and exercise  Role of exercise on diabetes management,  blood pressure control and cardiac health.   Medications Taught/reviewed insulin injection, site rotation, insulin storage and needle disposal.;Reviewed patients medication for diabetes, action, purpose, timing of dose and side effects.   Monitoring Purpose and frequency of SMBG.;Identified appropriate SMBG and/or A1C goals.   Acute complications Taught  treatment of hypoglycemia - the 15 rule.   Chronic complications Relationship between chronic complications and blood glucose control   Psychosocial adjustment Identified and addressed patients feelings and concerns about diabetes   Individualized Goals (developed by patient)   Reducing Risk Improve blood sugars Decrease medications Lose weight Lead a healthier lifestyle Become more fit   Outcomes   Expected Outcomes Demonstrated interest in learning. Expect positive outcomes   Future DMSE 4-6 wks      Individualized Plan for Diabetes Self-Management Training:   Learning Objective:  Patient will have a greater understanding of diabetes self-management. Patient education plan is to attend individual and/or group sessions per assessed needs and concerns.   Plan:   Patient Instructions  Check blood sugars 2 x day before breakfast and 2 hrs after supper every day Exercise: Continue walking on treadmill  for  30  minutes   2  days a week and gradually increase to 30 minutes 5 days a week Eat 3 meals day,  2  snacks a day Space meals 4-6 hours apart Don't skip meals Limit desserts and sweets Bring blood sugar records to the next class Carry fast acting glucose and a snack at all times Rotate injection sites   Expected Outcomes:  Demonstrated interest in learning. Expect positive outcomes  Education material provided:  General Meal Planning Guidelines Simple Meal Plan Symptoms, causes and treatments of Hypoglycemia  If problems or questions, patient to contact team via:  Johnny Maddox, [place], [place], CDE [phone]  Future DSME appointment: 4-6 wks  June 10, 2016 for Diabetes Class 1

## 2016-05-08 NOTE — Patient Instructions (Signed)
Check blood sugars 2 x day before breakfast and 2 hrs after supper every day  Exercise: Continue walking on treadmill  for  30  minutes   2  days a week and gradually increase to 30 minutes 5 days a week  Eat 3 meals day,  2  snacks a day Space meals 4-6 hours apart Don't skip meals Limit desserts and sweets  Bring blood sugar records to the next class  Carry fast acting glucose and a snack at all times Rotate injection sites  Return for classes on:

## 2016-05-08 NOTE — Progress Notes (Signed)
Patient ID: Johnny Maddox, male    DOB: 22-Sep-1956, 60 y.o.   MRN: [id number]  PCP: Reginia Forts, MD  Subjective:   Chief Complaint  Patient presents with  . Follow-up    cough shortness of breath medicine not working     HPI Presents for evaluation of cough.  He presented here on 6/06 with cough x 2+ months. Mostly non-productive, with occasional sputum. He had post-nasal drainage and clear rhinorrhea, sinus congestion and pressure. The cough was disrupting his sleep, worse with talking. OTC Claritin and Chloratab had not helped. His cough was thought likely to be due to post-viral or allergic-type symptoms. Supportive care was recommended, and he was prescribed an albuterol inhaler, Tessalon Perles for daytime use and Tussionex for HS.  He reports that he is no better, that none of the medications made any difference. "I'm always clearing my throat." Feels a lot of phlegm in the throat. Notes heartburn and indigestion. Cough is not worse at night or after meals.  Prior to the visit 04/16/2016, last seen here in 11/2015 for CPE with Ms. Carlean Purl, [place]. Diabetes reportedly managed by the [facility].   Review of Systems  Constitutional: Negative for fever, chills and fatigue.  HENT: Positive for congestion, postnasal drip, rhinorrhea and sinus pressure. Negative for sneezing, trouble swallowing and voice change.   Respiratory: Positive for cough. Negative for choking, chest tightness, shortness of breath and wheezing.   Cardiovascular: Negative for chest pain, palpitations and leg swelling.  Gastrointestinal: Negative for nausea, vomiting and diarrhea.  Neurological: Positive for headaches. Negative for dizziness.       Patient Active Problem List   Diagnosis Date Noted  . 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  . Follow-up circumcision 12/11/2015  . Balanitis 10/11/2015  . 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  . Avitaminosis D 09/13/2015  .  History of elevated PSA 06/06/2015  . Hydrocele sac 05/08/2015  . Impotence 05/08/2015  . BPH with obstruction/lower urinary tract symptoms 05/08/2015  . ED (erectile dysfunction) of organic origin 07/12/2014  . Microalbuminuria 07/10/2014  . Dyslipidemia 06/02/2014  . Diabetes mellitus, type 2 ([place]) 06/02/2014  . Type 2 diabetes mellitus ([place]) 06/02/2014  . Cough syncope 06/08/2013  . Diabetes mellitus ([place]) 10/24/2012  . Chronic venous insufficiency 03/13/2008  . Apnea, sleep 03/13/2008     Prior to Admission medications   Medication Sig Start Date End Date Taking? Authorizing Provider  albuterol (PROVENTIL HFA;VENTOLIN HFA) 108 (90 Base) MCG/ACT inhaler Inhale 2 puffs into the lungs every 4 (four) hours as needed for wheezing or shortness of breath (cough, shortness of breath or wheezing.). 04/16/16  Yes Elby Beck, FNP  atorvastatin (LIPITOR) 20 MG tablet Take 20 mg by mouth at bedtime. 11/10/15  Yes Historical Provider, MD  B-D ULTRAFINE III SHORT PEN 31G X 8 MM MISC See admin instructions. 01/08/16  Yes Historical Provider, MD  Blood Glucose Monitoring Suppl (FIFTY50 GLUCOSE METER 2.0) w/Device KIT  02/05/16 02/04/17 Yes Historical Provider, MD  Dulaglutide (TRULICITY) 1.5 TD/9.7CB SOPN Inject 1.5 mg into the skin once a week.   Yes Historical Provider, MD  finasteride (PROSCAR) 5 MG tablet TAKE 1 TABLET (5 MG TOTAL) BY MOUTH ONCE DAILY. 04/10/16  Yes Historical Provider, MD  furosemide (LASIX) 20 MG tablet Take 20 mg by mouth daily. Reported on 11/22/2015   Yes Historical Provider, MD  glucose blood (ONETOUCH VERIO) test strip Use 1 strip via meter twice daily as directed. 05/20/14  Yes Historical Provider, MD  Insulin Glargine (LANTUS) 100 UNIT/ML Solostar Pen Inject 45 Units into the skin daily at 10 pm.   Yes Historical Provider, MD  metFORMIN (GLUCOPHAGE) 1000 MG tablet TAKE 1 TABLET (1,000 MG TOTAL) BY MOUTH 2 (TWO) TIMES DAILY WITH MEALS. 01/06/16  Yes Historical Provider, MD    Multiple Vitamins-Minerals (MULTIVITAMIN PO) Take 1 tablet by mouth daily.    Yes Historical Provider, MD  tadalafil (CIALIS) 20 MG tablet Take 1 tablet (20 mg total) by mouth daily as needed for erectile dysfunction. 02/27/16  Yes Shannon A McGowan, PA-C  benzonatate (TESSALON) 100 MG capsule Take 1-2 capsules (100-200 mg total) by mouth 3 (three) times daily as needed for cough. Patient not taking: Reported on 05/08/2016 04/16/16   Elby Beck, FNP     Allergies  Allergen Reactions  . Other Anaphylaxis    Mushrooms       Objective:  Physical Exam  Constitutional: He is oriented to person, place, and time. He appears well-developed and well-nourished. He is active and cooperative. No distress.  BP 126/80 mmHg  Pulse 101  Temp(Src) 97.7 F (36.5 C) (Oral)  Resp 18  Ht 5' 10"  (1.778 m)  Wt 279 lb (126.554 kg)  BMI 40.03 kg/m2  SpO2 96%  HENT:  Head: Normocephalic and atraumatic.  Right Ear: Hearing normal.  Left Ear: Hearing normal.  Eyes: Conjunctivae are normal. No scleral icterus.  Neck: Normal range of motion. Neck supple. No thyromegaly present.  Cardiovascular: Normal rate, regular rhythm and normal heart sounds.   Pulses:      Radial pulses are 2+ on the right side, and 2+ on the left side.  Pulmonary/Chest: Effort normal and breath sounds normal.  Lymphadenopathy:       Head (right side): No tonsillar, no preauricular, no posterior auricular and no occipital adenopathy present.       Head (left side): No tonsillar, no preauricular, no posterior auricular and no occipital adenopathy present.    He has no cervical adenopathy.       Right: No supraclavicular adenopathy present.       Left: No supraclavicular adenopathy present.  Neurological: He is alert and oriented to person, place, and time. No sensory deficit.  Skin: Skin is warm, dry and intact. No rash noted. No cyanosis or erythema. Nails show no clubbing.  Psychiatric: His speech is normal and behavior is  normal. His mood appears not anxious. His affect is angry (he is upset by the long wait and then inability to see the provider of his choice). His affect is not blunt, not labile and not inappropriate. He does not exhibit a depressed mood.       Dg Chest 2 View  05/08/2016  CLINICAL DATA:  Eight weeks of cough, nonsmoker. EXAM: CHEST  2 VIEW COMPARISON:  None in PACs FINDINGS: The lungs are adequately inflated. There is no focal infiltrate. There is no pleural effusion or pneumothorax. The heart and pulmonary vascularity are normal. The mediastinum is normal in width. The bony thorax exhibits no acute abnormality. IMPRESSION: There is no active cardiopulmonary disease. Electronically Signed   By: David  Martinique M.D.   On: 05/08/2016 13:43       Assessment & Plan:   1. Cough Normal CXR is reassuring. Possibly chronic allergies, though treatment has not resolved the cough. Treat for both subacute sinusitis and LPR.  - DG Chest 2 View; Future - ipratropium (ATROVENT) 0.03 % nasal spray; Place 2 sprays into both nostrils 2 (two) times  daily.  Dispense: 30 mL; Refill: 0 - amoxicillin-clavulanate (AUGMENTIN) 875-125 MG tablet; Take 1 tablet by mouth 2 (two) times daily.  Dispense: 20 tablet; Refill: 0 - pantoprazole (PROTONIX) 40 MG tablet; Take 1 tablet (40 mg total) by mouth daily.  Dispense: 30 tablet; Refill: 3   Fara Chute, PA-C Physician Assistant-Certified [facility]

## 2016-05-08 NOTE — Patient Instructions (Addendum)
Things that often make reflux symptoms worse: Caffeine Carbonation (soda) Spicy foods Acidic foods (like tomato sauce, orange juice, lemonade) Fatty foods (including whole milk and ice cream) Stress (feeling sad, worried, nervous) Nicotine Alcohol NSAIDS (non-steroidal anti-inflammatories, like ibuprofen (Advil, Motrin) or naproxen (Aleve)).     IF you received an x-ray today, you will receive an invoice from [facility]. Please contact [facility] at [phone] with questions or concerns regarding your invoice.   IF you received labwork today, you will receive an invoice from [employer]. Please contact [facility] at [phone] with questions or concerns regarding your invoice.   Our billing staff will not be able to assist you with questions regarding bills from these companies.  You will be contacted with the lab results as soon as they are available. The fastest way to get your results is to activate your My Chart account. Instructions are located on the last page of this paperwork. If you have not heard from [place] regarding the results in 2 weeks, please contact this office.

## 2016-06-04 ENCOUNTER — Other Ambulatory Visit: Payer: Self-pay | Admitting: Physician Assistant

## 2016-06-04 DIAGNOSIS — R05 Cough: Secondary | ICD-10-CM

## 2016-06-04 DIAGNOSIS — R059 Cough, unspecified: Secondary | ICD-10-CM

## 2016-06-06 NOTE — Telephone Encounter (Signed)
Chelle, do you want to give RFs? 

## 2016-06-10 ENCOUNTER — Ambulatory Visit: Payer: 59

## 2016-06-12 ENCOUNTER — Telehealth: Payer: Self-pay | Admitting: Dietician

## 2016-06-12 NOTE — Telephone Encounter (Signed)
Called patient to reschedule class which he missed on 06/10/16. Left voicemail message for him to call back and reschedule the class series.

## 2016-06-17 ENCOUNTER — Ambulatory Visit: Payer: 59

## 2016-06-24 ENCOUNTER — Ambulatory Visit: Payer: 59

## 2016-06-29 ENCOUNTER — Other Ambulatory Visit: Payer: Self-pay | Admitting: Urology

## 2016-06-29 DIAGNOSIS — N529 Male erectile dysfunction, unspecified: Secondary | ICD-10-CM

## 2016-07-03 ENCOUNTER — Encounter: Payer: Self-pay | Admitting: *Deleted

## 2016-08-06 DIAGNOSIS — Z6841 Body Mass Index (BMI) 40.0 and over, adult: Secondary | ICD-10-CM

## 2016-08-08 ENCOUNTER — Encounter: Payer: Self-pay | Admitting: Urology

## 2016-08-08 ENCOUNTER — Ambulatory Visit: Payer: 59 | Admitting: Urology

## 2016-08-08 VITALS — BP 119/77 | HR 98 | Ht 70.0 in | Wt 280.0 lb

## 2016-08-08 DIAGNOSIS — N432 Other hydrocele: Secondary | ICD-10-CM | POA: Diagnosis not present

## 2016-08-08 DIAGNOSIS — Z87898 Personal history of other specified conditions: Secondary | ICD-10-CM | POA: Diagnosis not present

## 2016-08-08 DIAGNOSIS — N138 Other obstructive and reflux uropathy: Secondary | ICD-10-CM

## 2016-08-08 DIAGNOSIS — N401 Enlarged prostate with lower urinary tract symptoms: Secondary | ICD-10-CM

## 2016-08-08 DIAGNOSIS — N529 Male erectile dysfunction, unspecified: Secondary | ICD-10-CM

## 2016-08-08 NOTE — Progress Notes (Signed)
08/08/2016 11:17 PM   Johnny Maddox 12-Jun-1956 [id number]  Referring provider: Wardell Honour, MD [address] [place], [place] [address]  Chief Complaint  Patient presents with  . Hydrocele    discuss surgery    HPI: Patient is a 60 year old African-American male with a history of elevated PSA, BPH with LUTS, erectile dysfunction and hydrocele who presents today To discuss surgery for his hydrocele.    History of elevated PSA Patient's PSA has reduced from 4.6 ng/mL in 07/2014 to 2.5 ng/mL on 10/11/2015 on finasteride. Patient discontinued the finasteride for unknown reasons and his recent PSA returned at 4.0 ng/mL on 04/10/2016.  He has since restarted his finasteride and he will have a repeated PSA in October 2018.    BPH WITH LUTS His IPSS score  was 9, which is moderate lower urinary tract symptomatology. He is pleased with his quality life due to his urinary symptoms. He denies any dysuria, hematuria or suprapubic pain.   He also denies any recent fevers, chills, nausea or vomiting.  He does not have a family history of PCa.  He is currently on finasteride.    Erectile dysfunction His SHIM score was 12, which is mild to moderate erectile dysfunction.   He has been having difficulty with erections for over 3 years .   His major complaint is maintaining an erection for the completion of satisfactory intercourse.  His libido is preserved.   His risk factors for ED are DM, age, BPH, HTN and vascular disease.  He denies any painful erections or curvatures with his erections.   He has tried PDE5-inhibitors in the past and found them either hit or miss and efficacy.  He is still having spontaneous erections.     PMH: Past Medical History:  Diagnosis Date  . Acute laryngitis, without mention of obstruction   . Allergic rhinitis, cause unspecified   . Chalazion   . Cough   . Dermatophytosis of foot   . Diabetes mellitus without complication ([place])   . Elevated PSA   .  Hyperlipidemia   . Hypertension   . Microalbuminuria   . Overweight   . Seborrheic dermatitis, unspecified   . Sleep apnea   . Unspecified disorder of autonomic nervous system   . Unspecified sleep apnea   . Unspecified venous (peripheral) insufficiency   . Unspecified vitamin D deficiency     Surgical History: Past Surgical History:  Procedure Laterality Date  . CIRCUMCISION    . ROTATOR CUFF REPAIR  R 2011, L 2006   L and R shoulder  . TONSILLECTOMY AND ADENOIDECTOMY      Home Medications:    Medication List       Accurate as of 08/08/16 11:59 PM. Always use your most recent med list.          atorvastatin 20 MG tablet Commonly known as:  LIPITOR Take 20 mg by mouth at bedtime.   B-D ULTRAFINE III SHORT PEN 31G X 8 MM Misc Generic drug:  Insulin Pen Needle See admin instructions.   CIALIS 20 MG tablet Generic drug:  tadalafil TAKE ONE TABLET BY MOUTH AS NEEDED FOR ERECTILE DYSFUNCTION   FIFTY50 GLUCOSE METER 2.0 w/Device Kit   finasteride 5 MG tablet Commonly known as:  PROSCAR TAKE 1 TABLET (5 MG TOTAL) BY MOUTH ONCE DAILY.   furosemide 20 MG tablet Commonly known as:  LASIX Take 20 mg by mouth daily. Reported on 11/22/2015   HUMALOG KWIKPEN 100 UNIT/ML KiwkPen  Generic drug:  insulin lispro INJECT 12 UNITS SUBCUTANEOUSLY 3 (THREE) TIMES DAILY WITH MEALS.   Insulin Glargine 100 UNIT/ML Solostar Pen Commonly known as:  LANTUS Inject 45 Units into the skin daily at 10 pm.   lisinopril 5 MG tablet Commonly known as:  PRINIVIL,ZESTRIL   metFORMIN 1000 MG tablet Commonly known as:  GLUCOPHAGE TAKE 1 TABLET (1,000 MG TOTAL) BY MOUTH 2 (TWO) TIMES DAILY WITH MEALS.   MULTIVITAMIN PO Take 1 tablet by mouth daily.   ONETOUCH VERIO test strip Generic drug:  glucose blood Use 1 strip via meter twice daily as directed.   TRULICITY 1.5 ZW/2.5EN Sopn Generic drug:  Dulaglutide Inject 1.5 mg into the skin once a week.       Allergies:  Allergies    Allergen Reactions  . Other Anaphylaxis    Mushrooms    Family History: Family History  Problem Relation Age of Onset  . Cancer Mother     leukemia  . Hypertension Mother   . Alcohol abuse Father   . Hypertension Brother   . Prostate cancer Neg Hx   . Bladder Cancer Neg Hx     Social History:  reports that he has never smoked. He has never used smokeless tobacco. He reports that he drinks about 1.2 oz of alcohol per week . He reports that he does not use drugs.  ROS: UROLOGY Frequent Urination?: No Hard to postpone urination?: No Burning/pain with urination?: No Get up at night to urinate?: No Leakage of urine?: No Urine stream starts and stops?: No Trouble starting stream?: No Do you have to strain to urinate?: No Blood in urine?: No Urinary tract infection?: No Sexually transmitted disease?: No Injury to kidneys or bladder?: No Painful intercourse?: No Weak stream?: No Erection problems?: No Penile pain?: No  Gastrointestinal Nausea?: No Vomiting?: No Indigestion/heartburn?: No Diarrhea?: No Constipation?: No  Constitutional Fever: No Night sweats?: No Weight loss?: No Fatigue?: No  Skin Skin rash/lesions?: No Itching?: No  Eyes Blurred vision?: No Double vision?: No  Ears/Nose/Throat Sore throat?: No Sinus problems?: No  Hematologic/Lymphatic Swollen glands?: No Easy bruising?: No  Cardiovascular Leg swelling?: No Chest pain?: No  Respiratory Cough?: No Shortness of breath?: No  Endocrine Excessive thirst?: No  Musculoskeletal Back pain?: No Joint pain?: No  Neurological Headaches?: No Dizziness?: No  Psychologic Depression?: No Anxiety?: No  Physical Exam: BP 119/77   Pulse 98   Ht 5' 10"  (1.778 m)   Wt 280 lb (127 kg)   BMI 40.18 kg/m   Constitutional: Well nourished. Alert and oriented, No acute distress. HEENT: [place] AT, moist mucus membranes. Trachea midline, no masses. Cardiovascular: No clubbing, cyanosis,  or edema. Respiratory: Normal respiratory effort, no increased work of breathing. GI: Abdomen is soft, non tender, non distended, no abdominal masses. Liver and spleen not palpable.  No hernias appreciated.  Stool sample for occult testing is not indicated.   GU: No CVA tenderness.  No bladder fullness or masses.  Patient with circumcised phallus.  Urethral meatus is patent.  No penile discharge. No penile lesions or rashes. Scrotum without lesions, cysts, rashes and/or edema.  Right hydrocele is noted. Right testicle and epididymides could not be palpated.    Left testicle is scrotal relocated no masses appreciated. Left epididymitis is normal. Rectal: Patient with  normal sphincter tone. Anus and perineum without scarring or rashes. No rectal masses are appreciated. Prostate is approximately 50 grams, no nodules are appreciated. Seminal vesicles are normal. Skin: No  rashes, bruises or suspicious lesions. Lymph: No cervical or inguinal adenopathy. Neurologic: Grossly intact, no focal deficits, moving all 4 extremities. Psychiatric: Normal mood and affect.  Laboratory Data: Lab Results  Component Value Date   WBC 5.5 12/28/2012   HGB 14.1 12/28/2012   HCT 42.7 12/28/2012   MCV 81.6 12/28/2012   PLT 152 12/28/2012    Lab Results  Component Value Date   CREATININE 0.99 12/28/2012     Lab Results  Component Value Date   HGBA1C 7.8 09/28/2015   PSA History  2.6 ng/mL on 05/08/2015  2.5 ng/mL on 10/11/2015  4.4 ng/mL on 04/03/2016-patient stopped finasteride 6 months ago  4.0 ng/mL on 04/10/2016        Component Value Date/Time   CHOL 183 12/28/2012 1230   HDL 41 12/28/2012 1230   CHOLHDL 4.5 12/28/2012 1230   VLDL 26 12/28/2012 1230   LDLCALC 116 (H) 12/28/2012 1230    Lab Results  Component Value Date   AST 21 12/28/2012   Lab Results  Component Value Date   ALT 29 12/28/2012   Assessment & Plan:    1. History of elevated PSA  - Recent PSA of 4.0 and May 2017  -  Recheck PSA in October 2018  2. BPH with obstruction/lower urinary tract symptoms:   Previous IPSS score was 9/1.  Restarted the finasteride 5 mg daily. He will return in 6 months for I PSS score, PSA and exam.  3. Erectile dysfunction: SHIM score is 12.  He is given a sample of Viagra 100 mg. He is advised to take it 2 hours prior to intercourse on an empty stomach. I have sent a prescription to his pharmacy for the Viagra.  He will return in 6 months for [facility] IM score and exam  4. Hydroceles  - Schedule scrotal ultrasound  - I explained to the patient how the hydrocelectomy is performed and the risk factors, such as: injury to spermatic vessels can occur, affecting fertility,  bleeding from the surgical incision, internal bleeding, infection and swelling of the scrotum for up to a month.  I also informed him of the recovery period of being able to resume most activities within seven to 10 days, although heavy lifting and sexual activities may be delayed for up to six weeks.  - Options including percutaneous drainage and hydrocelectomy were discussed in detail. We discussed the efficacy rates of each along with the recurrence rate. His most interested in more definitive treatment in the form of left hydrocelectomy. We discussed the surgery itself today at length. Postoperative complications including hematoma, swelling, pain, infection, damage to surrounding structures were all discussed in detail. We also discussed the recurrence rate following hydrocelectomy around 10%. Discussed the postoperative care including scrotal support, NSAIDs, and avoidance of strenuous physical activity for several weeks.  - He would to be scheduled for right hydrocelectomy.  - I also explained the risks of general anesthesia, such as: MI, CVA, paralysis, coma and/or death.    Return for Will call the patient with scrotal ultrasound results.  These notes generated with voice recognition software. I apologize for  typographical errors.  Zara Council, [facility] [address], [place], [place] [address] [phone]

## 2016-08-12 ENCOUNTER — Ambulatory Visit
Admission: RE | Admit: 2016-08-12 | Discharge: 2016-08-12 | Disposition: A | Discharge status: Home / Self Care | Payer: 59 | Source: Ambulatory Visit | Attending: Urology | Admitting: Urology

## 2016-08-12 DIAGNOSIS — N432 Other hydrocele: Secondary | ICD-10-CM

## 2016-08-12 DIAGNOSIS — N5089 Other specified disorders of the male genital organs: Secondary | ICD-10-CM | POA: Diagnosis not present

## 2016-08-12 DIAGNOSIS — N503 Cyst of epididymis: Secondary | ICD-10-CM | POA: Diagnosis not present

## 2016-08-13 ENCOUNTER — Telehealth: Payer: Self-pay

## 2016-08-13 ENCOUNTER — Other Ambulatory Visit: Payer: Self-pay

## 2016-08-13 ENCOUNTER — Other Ambulatory Visit: Payer: 59

## 2016-08-13 DIAGNOSIS — N5089 Other specified disorders of the male genital organs: Secondary | ICD-10-CM

## 2016-08-13 NOTE — Telephone Encounter (Signed)
-----   Message from Nori Riis, PA-C sent at 08/13/2016  1:40 PM EDT ----- I spoke with the patient and let him know the results of his scrotal ultrasound.  He will testicular tumor markers and an appointment with on of the physicians next week.

## 2016-08-13 NOTE — Telephone Encounter (Signed)
Can you make this appt and call the pt?

## 2016-08-14 LAB — LACTATE DEHYDROGENASE: LDH: 141 IU/L (ref 121–224)

## 2016-08-14 LAB — AFP TUMOR MARKER: AFP-Tumor Marker: 4.1 ng/mL (ref 0.0–8.3)

## 2016-08-14 LAB — BETA HCG QUANT (REF LAB): hCG Quant: <1 m[IU]/mL (ref 0–3)

## 2016-08-14 NOTE — Telephone Encounter (Signed)
done

## 2016-08-21 ENCOUNTER — Ambulatory Visit (INDEPENDENT_AMBULATORY_CARE_PROVIDER_SITE_OTHER): Payer: 59 | Admitting: Urology

## 2016-08-21 VITALS — BP 124/81 | HR 99 | Ht 70.0 in | Wt 279.0 lb

## 2016-08-21 DIAGNOSIS — N433 Hydrocele, unspecified: Secondary | ICD-10-CM | POA: Diagnosis not present

## 2016-08-21 DIAGNOSIS — N5089 Other specified disorders of the male genital organs: Secondary | ICD-10-CM

## 2016-08-21 DIAGNOSIS — N509 Disorder of male genital organs, unspecified: Secondary | ICD-10-CM | POA: Diagnosis not present

## 2016-08-23 ENCOUNTER — Telehealth: Payer: Self-pay | Admitting: Radiology

## 2016-08-23 NOTE — Telephone Encounter (Signed)
Notified pt of surgery scheduled with Dr Erlene Quan on 09/23/16, pre-admit testing appt on 09/09/16 @9 :30 & to call Friday prior to surgery for arrival time to SDS. Advised pt to hold ASA 81mg  per Dr Erlene Quan x 7 days prior to surgery pt voices understanding.

## 2016-08-23 NOTE — Telephone Encounter (Signed)
LMOM. Need to notify pt of surgery information. 

## 2016-08-26 ENCOUNTER — Other Ambulatory Visit: Payer: Self-pay | Admitting: Radiology

## 2016-08-26 DIAGNOSIS — N433 Hydrocele, unspecified: Secondary | ICD-10-CM

## 2016-09-05 NOTE — Progress Notes (Signed)
08/21/2016 8:44 AM   Johnny Maddox 12-01-1955 [id number]  Referring provider: Wardell Honour, MD [address] [place], [place] [address]  Chief Complaint  Patient presents with  . Testicular Mass    u/s results    HPI:  60 yo M with history of elevated PSA and Large right symptomatic hydrocele.  He presents today to discuss hydrocelectomy.  Right sided hydrocele is approximately softball size and is causing him discomfort in the form of scrotal heaviness and aching. It was then stable in size for at least over a year. He is anxious to have it treated at this point.  In the interim, he underwent repeat scrotal ultrasound which shows a newly identified 5 x 4 x 4 mm hypoechoic mass within the left testicle which was not previously appreciated on scrotal ultrasound from a year ago.    Murmurs are negative.  He denies a family history of testicular cancer.   PMH: Past Medical History:  Diagnosis Date  . Acute laryngitis, without mention of obstruction   . Allergic rhinitis, cause unspecified   . Chalazion   . Cough   . Dermatophytosis of foot   . Diabetes mellitus without complication ([place])   . Elevated PSA   . Hyperlipidemia   . Hypertension   . Microalbuminuria   . Overweight   . Seborrheic dermatitis, unspecified   . Sleep apnea   . Unspecified disorder of autonomic nervous system   . Unspecified sleep apnea   . Unspecified venous (peripheral) insufficiency   . Unspecified vitamin D deficiency     Surgical History: Past Surgical History:  Procedure Laterality Date  . CIRCUMCISION    . ROTATOR CUFF REPAIR  R 2011, L 2006   L and R shoulder  . TONSILLECTOMY AND ADENOIDECTOMY      Home Medications:    Medication List       Accurate as of 08/21/16 11:59 PM. Always use your most recent med list.          aspirin EC 81 MG tablet Take 81 mg by mouth every evening.   atorvastatin 20 MG tablet Commonly known as:  LIPITOR Take 20 mg by mouth at bedtime.   atorvastatin 40 MG tablet Commonly known as:  LIPITOR Take 40 mg by mouth every evening.   B-D ULTRAFINE III SHORT PEN 31G X 8 MM Misc Generic drug:  Insulin Pen Needle See admin instructions.   CIALIS 20 MG tablet Generic drug:  tadalafil TAKE ONE TABLET BY MOUTH AS NEEDED FOR ERECTILE DYSFUNCTION   FIFTY50 GLUCOSE METER 2.0 w/Device Kit   finasteride 5 MG tablet Commonly known as:  PROSCAR TAKE 1 TABLET (5 MG TOTAL) BY MOUTH ONCE DAILY IN THE EVENING   furosemide 20 MG tablet Commonly known as:  LASIX Take 20 mg by mouth every evening. Reported on 11/22/2015   HUMALOG KWIKPEN 100 UNIT/ML KiwkPen Generic drug:  insulin lispro INJECT 12 UNITS SUBCUTANEOUSLY DAILY AT 7 AM.   ibuprofen 200 MG tablet Commonly known as:  ADVIL,MOTRIN Take 400-600 mg by mouth every 8 (eight) hours as needed (for pain.).   Insulin Glargine 100 UNIT/ML Solostar Pen Commonly known as:  LANTUS Inject 45 Units into the skin daily at 10 pm.   lisinopril 5 MG tablet Commonly known as:  PRINIVIL,ZESTRIL Take 5 mg by mouth every evening.   metFORMIN 1000 MG tablet Commonly known as:  GLUCOPHAGE TAKE 1 TABLET (1,000 MG TOTAL) BY MOUTH 2 (TWO) TIMES DAILY WITH MEALS.  MULTIVITAMIN PO Take 1 tablet by mouth every evening.   ONETOUCH VERIO test strip Generic drug:  glucose blood Use 1 strip via meter twice daily as directed.   TRULICITY 1.5 BS/9.6GE Sopn Generic drug:  Dulaglutide Inject 1.5 mg into the skin every Saturday.       Allergies:  Allergies  Allergen Reactions  . Other Anaphylaxis    Mushrooms    Family History: Family History  Problem Relation Age of Onset  . Cancer Mother     leukemia  . Hypertension Mother   . Alcohol abuse Father   . Hypertension Brother   . Prostate cancer Neg Hx   . Bladder Cancer Neg Hx     Social History:  reports that he has never smoked. He has never used smokeless tobacco. He reports that he drinks about 1.2 oz of alcohol per week . He  reports that he does not use drugs.  ROS: UROLOGY Frequent Urination?: No Hard to postpone urination?: No Burning/pain with urination?: No Get up at night to urinate?: No Leakage of urine?: No Urine stream starts and stops?: No Trouble starting stream?: No Do you have to strain to urinate?: No Blood in urine?: No Urinary tract infection?: No Sexually transmitted disease?: No Injury to kidneys or bladder?: No Painful intercourse?: No Weak stream?: No Erection problems?: No Penile pain?: No  Gastrointestinal Nausea?: No Vomiting?: No Indigestion/heartburn?: No Diarrhea?: No Constipation?: No  Constitutional Fever: No Night sweats?: No Weight loss?: No Fatigue?: No  Skin Skin rash/lesions?: No Itching?: No  Eyes Blurred vision?: No Double vision?: No  Ears/Nose/Throat Sore throat?: No Sinus problems?: No  Hematologic/Lymphatic Swollen glands?: No Easy bruising?: No  Cardiovascular Leg swelling?: No Chest pain?: No  Respiratory Cough?: No Shortness of breath?: No  Endocrine Excessive thirst?: Yes  Musculoskeletal Back pain?: No Joint pain?: No  Neurological Headaches?: No Dizziness?: No  Psychologic Depression?: No Anxiety?: No  Physical Exam: BP 124/81   Pulse 99   Ht 5' 10"  (1.778 m)   Wt 279 lb (126.6 kg)   BMI 40.03 kg/m   Constitutional:  Alert and oriented, No acute distress. HEENT: [place] AT, moist mucus membranes.  Trachea midline, no masses. Cardiovascular: No clubbing, cyanosis, or edema. Respiratory: Normal respiratory effort, no increased work of breathing. GI: Abdomen is soft, nontender, nondistended, no abdominal masses.  Obese. No inguinal hernias appreciated. GU: Left testicle normal. Mass appreciated on testicular ultrasound is not palpable. Nontender. Right enlarged hemiscrotum with softball sized hydrocele. No overlying skin changes. Skin: No rashes, bruises or suspicious lesions. Lymph: No cervical or inguinal  adenopathy. Neurologic: Grossly intact, no focal deficits, moving all 4 extremities. Psychiatric: Normal mood and affect.  Laboratory Data: Lab Results  Component Value Date   WBC 5.5 12/28/2012   HGB 14.1 12/28/2012   HCT 42.7 12/28/2012   MCV 81.6 12/28/2012   PLT 152 12/28/2012    Lab Results  Component Value Date   CREATININE 0.99 12/28/2012   Component     Latest Ref Rng & Units 08/13/2016  AFP Tumor Marker     0.0 - 8.3 ng/mL 4.1   Component     Latest Ref Rng & Units 08/13/2016  LDH     121 - 224 IU/L 141   Component     Latest Ref Rng & Units 08/13/2016  hCG Quant     0 - 3 mIU/mL <1   Lab Results  Component Value Date   HGBA1C 7.8 09/28/2015  Pertinent Imaging: CLINICAL DATA:  Hydrocele.  EXAM: SCROTAL ULTRASOUND  DOPPLER ULTRASOUND OF THE TESTICLES  TECHNIQUE: Complete ultrasound examination of the testicles, epididymis, and other scrotal structures was performed. Color and spectral Doppler ultrasound were also utilized to evaluate blood flow to the testicles.  COMPARISON:  05/12/2015.  FINDINGS: Right testicle  Measurements: 4.5 x 2.7 x 3.0 cm. No mass or microlithiasis visualized.  Left testicle  Measurements: 4.8 x 2.3 x 3.1 cm. No microlithiasis visualized. 5.0 x 4.0 x 4.0 mm hypoechoic mass left testicle. This is new from prior exam.  Right epididymis:  4.4 x 3.6 x 4.0 mm cyst.  Left epididymis:  4.1 x 0.1 x 3.4 mm cyst.  Hydrocele: Large 9.1 x 7.3 x 5.9 cm hydrocele. Similar findings noted on prior exam .  Varicocele:  None visualized.  Pulsed Doppler interrogation of both testes demonstrates normal low resistance arterial and venous waveforms bilaterally.  IMPRESSION: 1. 5.0 x 4.0 x 4.0 mm hypoechoic mass left testicle. This is new from prior exam. A small left testicular tumor cannot be excluded.  2. Large right hydrocele again noted. Similar findings noted on prior exam.  3.  Small bilateral  epididymal cysts are again noted.   Electronically Signed   By: Marcello Moores  Register   On: 08/13/2016 06:49  Ultrasound was personally reviewed today with the patient.  Assessment & Plan:    1. Right hydrocele Symptomatic right hydrocele. We discussed alternatives today at length including pretty disease aspiration and hydrocelectomy. Risk and benefits of each were discussed as well as efficacy rates. Preoperative, intraoperative, postoperative course were discussed. We did discuss recovery and postoperative care at length today.  Risk of bleeding, infection, damage to surrounding structures, hematoma, and scrotal swelling and pain were discussed. We also discussed her need for possible drainage.  Recurrence of hydrocelectomy was also discussed.  In the setting of a possible left testicular mass, I have encouraged the patient to hold off on hydrocelectomy until we can decide how to manage the small lesion. If malignancy is suspected, may prefer not to violate the scrotal skin. Approach to be determined based on the follow-up scrotal ultrasound.  He is agreeable with this plan.    - [place] Art/Ven Flow Abd Pelv Doppler; Future - US Scrotum; Future  2. Testicular mass Newly identified 5 mm left hypoechoic mass within the testicle. Etiology of this is unclear. Tumor markers are negative.  Recommended repeat scrotal ultrasound in approximately 1 month to continue to follow this lesion. I suspect a benign pathology although malignancy cannot be ruled out at this point.   F/u scrotal ultrasound in 1 month --> will call with result.  Once this is complete, will arrange for right hydrocelectomy and possible intervention on left side if deemed appropriate.    Hollice Espy, MD  [facility] [address], [place], [place] [address] [phone]  I spent 25 min with this patient of which greater than 50% was spent in counseling and coordination of care with  the patient.

## 2016-09-09 ENCOUNTER — Encounter
Admission: RE | Admit: 2016-09-09 | Discharge: 2016-09-09 | Disposition: A | Discharge status: Home / Self Care | Payer: 59 | Source: Ambulatory Visit | Attending: Urology | Admitting: Urology

## 2016-09-09 DIAGNOSIS — I10 Essential (primary) hypertension: Secondary | ICD-10-CM | POA: Insufficient documentation

## 2016-09-09 DIAGNOSIS — Z01818 Encounter for other preprocedural examination: Secondary | ICD-10-CM | POA: Diagnosis present

## 2016-09-09 LAB — BASIC METABOLIC PANEL
Anion gap: 11 (ref 5–15)
BUN: 18 mg/dL (ref 6–20)
CO2: 21 mmol/L — ABNORMAL LOW (ref 22–32)
Calcium: 10.2 mg/dL (ref 8.9–10.3)
Chloride: 102 mmol/L (ref 101–111)
Creatinine, Ser: 0.89 mg/dL (ref 0.61–1.24)
GFR calc Af Amer: >60 mL/min (ref >60)
GFR calc non Af Amer: >60 mL/min (ref >60)
Glucose, Bld: 143 mg/dL — ABNORMAL HIGH (ref 65–99)
Potassium: 3.9 mmol/L (ref 3.5–5.1)
Sodium: 134 mmol/L — ABNORMAL LOW (ref 135–145)

## 2016-09-09 NOTE — Patient Instructions (Signed)
  Your procedure is scheduled [id number] 13, 2017 (Monday) Report to Same Day Surgery 2nd floor medical mall To find out your arrival time please call [phone] between 1PM - 3PM on September 20, 2016 (Friday)  Remember: Instructions that are not followed completely may result in serious medical risk, up to and including death, or upon the discretion of your surgeon and anesthesiologist your surgery may need to be rescheduled.    _x___ 1. Do not eat food or drink liquids after midnight. No gum chewing or hard candies.     __x__ 2. No Alcohol for 24 hours before or after surgery.   __x__3. No Smoking for 24 prior to surgery.   ____  4. Bring all medications with you on the day of surgery if instructed.    __x__ 5. Notify your doctor if there is any change in your medical condition     (cold, fever, infections).     Do not wear jewelry, make-up, hairpins, clips or nail polish.  Do not wear lotions, powders, or perfumes. You may wear deodorant.  Do not shave 48 hours prior to surgery. Men may shave face and neck.  Do not bring valuables to the hospital.    [facility] is not responsible for any belongings or valuables.               Contacts, dentures or bridgework may not be worn into surgery.  Leave your suitcase in the car. After surgery it may be brought to your room.  For patients admitted to the hospital, discharge time is determined by your treatment team.   Patients discharged the day of surgery will not be allowed to drive home.    Please read over the following fact sheets that you were given:   [facility] Preparing for Surgery and or MRSA Information   ___ Take these medicines the morning of surgery with A SIP OF WATER:    1.   2.  3.  4.  5.  6.  ____Fleets enema or Magnesium Citrate as directed.   ___ Use CHG Soap or sage wipes as directed on instruction sheet   ____ Use inhalers on the day of surgery and bring to hospital day of surgery  _x___ Stop  metformin 2 days prior to surgery(Stop Metformin on November 11)   _x___ Take 1/2 of usual insulin dose the night before surgery and none on the morning of surgery. (Take one-half of Lantus insulin on Sunday night prior to surgery and no insulin the morning of surgery)         _x___ Stop aspirin or coumadin, or plavix (Stop Aspirin one week prior to surgery)  x__ Stop Anti-inflammatories such as Advil, Aleve, Ibuprofen, Motrin, Naproxen,          Naprosyn, Goodies powders or aspirin products. Ok to take Tylenol.   ____ Stop supplements until after surgery.    ____ Bring C-Pap to the hospital.

## 2016-09-13 NOTE — Pre-Procedure Instructions (Signed)
EKG sent to Anesthesia for review. 

## 2016-09-16 ENCOUNTER — Ambulatory Visit
Admission: RE | Admit: 2016-09-16 | Discharge: 2016-09-16 | Disposition: A | Discharge status: Home / Self Care | Payer: 59 | Source: Ambulatory Visit | Attending: Urology | Admitting: Urology

## 2016-09-16 DIAGNOSIS — N433 Hydrocele, unspecified: Secondary | ICD-10-CM

## 2016-09-16 DIAGNOSIS — N5089 Other specified disorders of the male genital organs: Secondary | ICD-10-CM | POA: Diagnosis not present

## 2016-09-16 DIAGNOSIS — N503 Cyst of epididymis: Secondary | ICD-10-CM | POA: Insufficient documentation

## 2016-09-19 ENCOUNTER — Telehealth: Payer: Self-pay

## 2016-09-19 NOTE — Telephone Encounter (Signed)
Scrotal ultrasound results reviewed with the patient. Small left intratesticular lesion appears unchanged. As such, we'll defer biopsy or orchiectomy at this time given relatively low suspicion. We'll continue to follow this likely with a scrotal ultrasound in 2-3 months following surgery.  For academic reasons, I explained that I prefer to do the right hydrocelectomy through an inguinal incision as if he were ever to be diagnosed with testicular cancer down the road, there is concern for an alteration and lymphatic drainage. He understands that this might lead to slightly increased risk of being a hematoma but understands and is agreeable. All of his questions were answered today.  Hollice Espy, MD

## 2016-09-19 NOTE — Telephone Encounter (Signed)
Pt returned your phone call. Made pt aware you were currently with a pt. Pt voiced understanding.

## 2016-09-23 ENCOUNTER — Ambulatory Visit: Payer: 59 | Admitting: Anesthesiology

## 2016-09-23 ENCOUNTER — Encounter
Admission: RE | Disposition: A | Discharge status: Home / Self Care | Payer: Self-pay | Source: Ambulatory Visit | Attending: Urology

## 2016-09-23 ENCOUNTER — Ambulatory Visit
Admission: RE | Admit: 2016-09-23 | Discharge: 2016-09-23 | Disposition: A | Discharge status: Home / Self Care | Payer: 59 | Source: Ambulatory Visit | Attending: Urology | Admitting: Urology

## 2016-09-23 DIAGNOSIS — E559 Vitamin D deficiency, unspecified: Secondary | ICD-10-CM | POA: Insufficient documentation

## 2016-09-23 DIAGNOSIS — Z794 Long term (current) use of insulin: Secondary | ICD-10-CM | POA: Insufficient documentation

## 2016-09-23 DIAGNOSIS — E785 Hyperlipidemia, unspecified: Secondary | ICD-10-CM | POA: Insufficient documentation

## 2016-09-23 DIAGNOSIS — G4730 Sleep apnea, unspecified: Secondary | ICD-10-CM | POA: Diagnosis not present

## 2016-09-23 DIAGNOSIS — Z7982 Long term (current) use of aspirin: Secondary | ICD-10-CM | POA: Insufficient documentation

## 2016-09-23 DIAGNOSIS — Z91018 Allergy to other foods: Secondary | ICD-10-CM | POA: Diagnosis not present

## 2016-09-23 DIAGNOSIS — Z806 Family history of leukemia: Secondary | ICD-10-CM | POA: Insufficient documentation

## 2016-09-23 DIAGNOSIS — Z8249 Family history of ischemic heart disease and other diseases of the circulatory system: Secondary | ICD-10-CM | POA: Diagnosis not present

## 2016-09-23 DIAGNOSIS — N432 Other hydrocele: Secondary | ICD-10-CM | POA: Diagnosis not present

## 2016-09-23 DIAGNOSIS — E1151 Type 2 diabetes mellitus with diabetic peripheral angiopathy without gangrene: Secondary | ICD-10-CM | POA: Insufficient documentation

## 2016-09-23 DIAGNOSIS — I10 Essential (primary) hypertension: Secondary | ICD-10-CM | POA: Insufficient documentation

## 2016-09-23 DIAGNOSIS — N433 Hydrocele, unspecified: Secondary | ICD-10-CM

## 2016-09-23 DIAGNOSIS — Z811 Family history of alcohol abuse and dependence: Secondary | ICD-10-CM | POA: Diagnosis not present

## 2016-09-23 HISTORY — PX: HYDROCELE EXCISION: SHX482

## 2016-09-23 LAB — GLUCOSE, CAPILLARY
Glucose-Capillary: 290 mg/dL — ABNORMAL HIGH (ref 65–99)
Glucose-Capillary: 287 mg/dL — ABNORMAL HIGH (ref 65–99)
Glucose-Capillary: 246 mg/dL — ABNORMAL HIGH (ref 65–99)

## 2016-09-23 SURGERY — HYDROCELECTOMY
Anesthesia: General | Site: Inguinal | Laterality: Right | Wound class: Clean

## 2016-09-23 MED ORDER — LIDOCAINE HCL 1 % IJ SOLN
INTRAMUSCULAR | Status: DC | PRN
  Administered 2016-09-23: 16 mL

## 2016-09-23 MED ORDER — FENTANYL CITRATE (PF) 100 MCG/2ML IJ SOLN
25.0000 ug | INTRAMUSCULAR | Status: DC | PRN
  Administered 2016-09-23 (×4): 25 ug via INTRAVENOUS

## 2016-09-23 MED ORDER — PROPOFOL 10 MG/ML IV BOLUS
INTRAVENOUS | Status: DC | PRN
  Administered 2016-09-23: 200 mg via INTRAVENOUS

## 2016-09-23 MED ORDER — FAMOTIDINE 20 MG PO TABS
20.0000 mg | ORAL_TABLET | Freq: Once | ORAL | Status: AC
  Administered 2016-09-23: 20 mg via ORAL

## 2016-09-23 MED ORDER — INSULIN ASPART 100 UNIT/ML ~~LOC~~ SOLN
12.0000 [IU] | Freq: Once | SUBCUTANEOUS | Status: AC
  Administered 2016-09-23: 12 [IU] via SUBCUTANEOUS

## 2016-09-23 MED ORDER — SODIUM CHLORIDE 0.9 % IV SOLN
INTRAVENOUS | Status: DC
  Administered 2016-09-23: 07:00:00 via INTRAVENOUS

## 2016-09-23 MED ORDER — DEXAMETHASONE SODIUM PHOSPHATE 4 MG/ML IJ SOLN
INTRAMUSCULAR | Status: DC | PRN
  Administered 2016-09-23: 5 mg via INTRAVENOUS

## 2016-09-23 MED ORDER — FENTANYL CITRATE (PF) 100 MCG/2ML IJ SOLN
INTRAMUSCULAR | Status: DC | PRN
  Administered 2016-09-23: 100 ug via INTRAVENOUS
  Administered 2016-09-23: 150 ug via INTRAVENOUS

## 2016-09-23 MED ORDER — SUGAMMADEX SODIUM 500 MG/5ML IV SOLN
INTRAVENOUS | Status: DC | PRN
  Administered 2016-09-23: 500 mg via INTRAVENOUS

## 2016-09-23 MED ORDER — LIDOCAINE HCL (PF) 1 % IJ SOLN
INTRAMUSCULAR | Status: AC
  Filled 2016-09-23: qty 30

## 2016-09-23 MED ORDER — CEFAZOLIN SODIUM-DEXTROSE 2-4 GM/100ML-% IV SOLN
2.0000 g | INTRAVENOUS | Status: AC
  Administered 2016-09-23: 2 g via INTRAVENOUS

## 2016-09-23 MED ORDER — HYDROCODONE-ACETAMINOPHEN 5-325 MG PO TABS
ORAL_TABLET | ORAL | Status: AC
  Filled 2016-09-23: qty 1

## 2016-09-23 MED ORDER — HYDROCODONE-ACETAMINOPHEN 5-325 MG PO TABS: 1.0000 | ORAL_TABLET | Freq: Four times a day (QID) | ORAL | 0 refills | Status: DC | PRN

## 2016-09-23 MED ORDER — METOPROLOL TARTRATE 5 MG/5ML IV SOLN
INTRAVENOUS | Status: DC | PRN
  Administered 2016-09-23 (×2): 2.5 mg via INTRAVENOUS

## 2016-09-23 MED ORDER — DOCUSATE SODIUM 100 MG PO CAPS: 100.0000 mg | ORAL_CAPSULE | Freq: Two times a day (BID) | ORAL | 0 refills | Status: DC

## 2016-09-23 MED ORDER — ROCURONIUM BROMIDE 100 MG/10ML IV SOLN
INTRAVENOUS | Status: DC | PRN
  Administered 2016-09-23 (×2): 10 mg via INTRAVENOUS
  Administered 2016-09-23: 40 mg via INTRAVENOUS

## 2016-09-23 MED ORDER — MIDAZOLAM HCL 2 MG/2ML IJ SOLN
INTRAMUSCULAR | Status: DC | PRN
  Administered 2016-09-23: 1 mg via INTRAVENOUS

## 2016-09-23 MED ORDER — FENTANYL CITRATE (PF) 100 MCG/2ML IJ SOLN
INTRAMUSCULAR | Status: AC
  Administered 2016-09-23: 25 ug via INTRAVENOUS
  Filled 2016-09-23: qty 2

## 2016-09-23 MED ORDER — ONDANSETRON HCL 4 MG/2ML IJ SOLN: 4.0000 mg | Freq: Once | INTRAMUSCULAR | Status: DC | PRN

## 2016-09-23 MED ORDER — ONDANSETRON HCL 4 MG/2ML IJ SOLN
INTRAMUSCULAR | Status: DC | PRN
  Administered 2016-09-23: 4 mg via INTRAVENOUS

## 2016-09-23 MED ORDER — SUCCINYLCHOLINE CHLORIDE 20 MG/ML IJ SOLN
INTRAMUSCULAR | Status: DC | PRN
  Administered 2016-09-23: 100 mg via INTRAVENOUS

## 2016-09-23 MED ORDER — INSULIN REGULAR HUMAN 100 UNIT/ML IJ SOLN
12.0000 [IU] | Freq: Once | INTRAMUSCULAR | Status: DC
  Filled 2016-09-23: qty 0.12

## 2016-09-23 MED ORDER — HYDROCODONE-ACETAMINOPHEN 5-325 MG PO TABS
1.0000 | ORAL_TABLET | Freq: Four times a day (QID) | ORAL | Status: AC | PRN
  Administered 2016-09-23: 1 via ORAL

## 2016-09-23 MED ORDER — LIDOCAINE HCL (CARDIAC) 20 MG/ML IV SOLN
INTRAVENOUS | Status: DC | PRN
  Administered 2016-09-23: 100 mg via INTRAVENOUS

## 2016-09-23 MED ORDER — INSULIN ASPART 100 UNIT/ML ~~LOC~~ SOLN: 12.0000 [IU] | Freq: Once | SUBCUTANEOUS | Status: DC

## 2016-09-23 MED ORDER — BUPIVACAINE HCL (PF) 0.5 % IJ SOLN
INTRAMUSCULAR | Status: AC
  Filled 2016-09-23: qty 30

## 2016-09-23 SURGICAL SUPPLY — 39 items
BLADE CLIPPER SURG (BLADE) ×2 IMPLANT
BLADE SURG 15 STRL LF DISP TIS (BLADE) ×1 IMPLANT
BLADE SURG 15 STRL SS (BLADE) ×1
BRUSH SCRUB EZ 1% IODOPHOR (MISCELLANEOUS) IMPLANT
CANISTER SUCT 1200ML W/VALVE (MISCELLANEOUS) ×2 IMPLANT
CHLORAPREP W/TINT 26ML (MISCELLANEOUS) ×2 IMPLANT
DRAIN PENROSE 1/4X12 LTX (DRAIN) ×2 IMPLANT
DRAPE LAPAROTOMY 77X122 PED (DRAPES) ×2 IMPLANT
ELECT REM PT RETURN 9FT ADLT (ELECTROSURGICAL) ×2
ELECTRODE REM PT RTRN 9FT ADLT (ELECTROSURGICAL) ×1 IMPLANT
GAUZE FLUFF 18X24 1PLY STRL (GAUZE/BANDAGES/DRESSINGS) ×2 IMPLANT
GAUZE SPONGE 4X4 12PLY STRL (GAUZE/BANDAGES/DRESSINGS) ×2 IMPLANT
GAUZE STRETCH 2X75IN STRL (MISCELLANEOUS) ×2 IMPLANT
GLOVE BIO SURGEON STRL SZ 6.5 (GLOVE) ×2 IMPLANT
GLOVE BIO SURGEON STRL SZ7 (GLOVE) ×2 IMPLANT
GLOVE BIOGEL M 7.0 STRL (GLOVE) ×4 IMPLANT
GLOVE BIOGEL PI IND STRL 7.5 (GLOVE) ×2 IMPLANT
GLOVE BIOGEL PI INDICATOR 7.5 (GLOVE) ×2
GOWN STRL REUS W/ TWL LRG LVL3 (GOWN DISPOSABLE) ×3 IMPLANT
GOWN STRL REUS W/TWL LRG LVL3 (GOWN DISPOSABLE) ×3
KIT RM TURNOVER STRD PROC AR (KITS) ×2 IMPLANT
LABEL OR SOLS (LABEL) ×2 IMPLANT
LIQUID BAND (GAUZE/BANDAGES/DRESSINGS) ×2 IMPLANT
NEEDLE HYPO 25X1 1.5 SAFETY (NEEDLE) ×2 IMPLANT
NS IRRIG 500ML POUR BTL (IV SOLUTION) ×2 IMPLANT
PACK BASIN MINOR ARMC (MISCELLANEOUS) ×2 IMPLANT
SOL PREP PVP 2OZ (MISCELLANEOUS)
SOLUTION PREP PVP 2OZ (MISCELLANEOUS) IMPLANT
SUPPORETR ATHLETIC LG (MISCELLANEOUS) ×1 IMPLANT
SUPPORTER ATHLETIC LG (MISCELLANEOUS) ×2
SUT CHROMIC 3 0 SH 27 (SUTURE) ×4 IMPLANT
SUT ETHILON 3-0 FS-10 30 BLK (SUTURE) ×2
SUT ETHILON NAB PS2 4-0 18IN (SUTURE) ×2 IMPLANT
SUT VIC AB 3-0 SH 27 (SUTURE) ×3
SUT VIC AB 3-0 SH 27X BRD (SUTURE) ×3 IMPLANT
SUT VIC AB 4-0 SH 27 (SUTURE) ×1
SUT VIC AB 4-0 SH 27XANBCTRL (SUTURE) ×1 IMPLANT
SUTURE EHLN 3-0 FS-10 30 BLK (SUTURE) ×1 IMPLANT
SYRINGE 10CC LL (SYRINGE) ×2 IMPLANT

## 2016-09-23 NOTE — Op Note (Addendum)
Date of procedure: 09/23/16  Preoperative diagnosis:  1. Right symptomatic hydrocele   Postoperative diagnosis:  1. Same as above   Procedure: 1. Right hydrocelectomy, inguinal approach  Surgeon: Hollice Espy, MD  Anesthesia: General  Complications: None  Intraoperative findings: 150 cc left hydrocele, blind ending, straw-colored fluid.  EBL: Minimal  Specimens: Hydrocele sac  Drains: None  Indication: Johnny Maddox is a 60 y.o. patient with symptomatic right hydrocele. He also has an incidental 0.5 cm right intratesticular lesion, negative tumor markers which has been stable. Given the concern for possible development or progression of this, will pursue inguinal approach as to not disrupt the lymphatic channels via scrotal approach.  After reviewing the management options for treatment, he elected to proceed with the above surgical procedure(s). We have discussed the potential benefits and risks of the procedure, side effects of the proposed treatment, the likelihood of the patient achieving the goals of the procedure, and any potential problems that might occur during the procedure or recuperation. Informed consent has been obtained.  Description of procedure:  The patient was taken to the operating room and general anesthesia was induced.  The patient was placed in the supine position, prepped and draped in the usual sterile fashion, and preoperative antibiotics were administered. A preoperative time-out was performed.   Approximately 8 cm subinguinal incision was made in the right inguinal area after instilling copious amount of 1% lidocaine.  The incision was carried down through the subcutaneous tissues. Bovie electrocautery. The left cord and cord structures were identified and isolated.  The left testicle was then delivered through the incision. The gubernaculum was identified and tied off using a Vicryl tie. This was then ligated using Bovie electrocautery to further free up  the testicle. The hydrocele sac appeared regular and non-tense. An incision was made within the heart shows sac fluid was evacuated. 150 cc of straw like fluid was evacuated. The edges of the hydrocele sac were then removed using Bovie electrocautery on either side with care taken to avoid any injury to the testicle, cord structures, epididymis. Once this was completed, the edges of the hydrocele sac were everted and oversewn reapproximating the 2 cut edges up to the level of the cord with care taken to avoid string lesion to the cord and cord structures. Careful hemostasis was achieved within the scrotal sac by everting the scrotal sac through the incision. There is no active bleeding noted on the testicle or cord structures. Testicle was then returned into the left hemiscrotum with care taken to preserve its correct orientation with lateral sulcus of the lateral direction. The subcutaneous tissues were closed in 2 layers using 3-0 Vicryl suture. Prior to this, the wound was copiously irrigated using saline. The skin was then closed using a 4-0 Monocryl sutures were particular fashion. The patient was cleaned and dried and a dressing of Dermabond was applied. Scrotal fluffs and a scrotal support was also applied. The patient reversed from anesthesia, taken to the PACU in stable condition. There are no common locations in this case.  Plan: Patient will follow-up in 4 weeks for wound check. At this time, we will devise a plan for further surveillance of his left intratesticular small lesion, likely with serial imaging.  Hollice Espy, M.D.

## 2016-09-23 NOTE — Anesthesia Postprocedure Evaluation (Signed)
Anesthesia Post Note  Patient: Johnny Maddox  Procedure(s) Performed: Procedure(s) (LRB): HYDROCELECTOMY ADULT  (Right)  Patient location during evaluation: PACU Anesthesia Type: General Level of consciousness: awake and alert Pain management: pain level controlled Vital Signs Assessment: post-procedure vital signs reviewed and stable Respiratory status: spontaneous breathing and respiratory function stable Cardiovascular status: stable Anesthetic complications: no    Last Vitals:  Vitals:   09/23/16 0932 09/23/16 0945  BP:  110/72  Pulse: 94 98  Resp: 18 12  Temp:      Last Pain:  Vitals:   09/23/16 0945  TempSrc:   PainSc: Asleep                 KEPHART,WILLIAM K

## 2016-09-23 NOTE — Discharge Instructions (Signed)
Hydrocelectomy, Care After Refer to this sheet in the next few weeks. These instructions provide you with information about caring for yourself after your procedure. Your health care provider may also give you more specific instructions. Your treatment has been planned according to current medical practices, but problems sometimes occur. Call your health care provider if you have any problems or questions after your procedure. WHAT TO EXPECT AFTER THE PROCEDURE After your procedure, it is common for the pouch that holds your testicles (scrotum) to be painful, swollen, and bruised. HOME CARE INSTRUCTIONS Bathing  Ask your health care provider when you can shower, take baths, or go swimming.  If you were told to wear an athletic support strap, take it off when you shower or take a bath. Incision Care  Follow instructions from your health care provider about how to take care of your incision. Make sure you:  Wash your hands with soap and water before you change your bandage (dressing). If soap and water are not available, use hand sanitizer.  Change your dressing as told by your health care provider.  Leave stitches (sutures) in place.  Check your incision and scrotum every day for signs of infection. Check for:  More redness, swelling, or pain.  Blood or fluid.  Warmth.  Pus or a bad smell. Managing Pain, Stiffness, and Swelling  If directed, apply ice to the injured area:  Put ice in a plastic bag.  Place a towel between your skin and the bag.  Leave the ice on for 20 minutes, 2-3 times per day. Driving  Do not drive for 24 hours if you received a sedative.  Do not drive or operate heavy machinery while taking prescription pain medicine.  Ask your health care provider when it is safe to drive. Activity  Do not do any activities that require great strength and energy (are vigorous) for as long as told by your health care provider.  Return to your normal activities as  told by your health care provider. Ask your health care provider what activities are safe for you.  Do not lift anything that is heavier than 10 lb (4.5 kg) until your health care provider says that it is safe. General Instructions  Take over-the-counter and prescription medicines only as told by your health care provider.  Keep all follow-up visits as told by your health care provider. This is important.  If you were given an athletic support strap, wear it as told by your health care provider.  If you had a drain put in during the procedure, you will need to return to have it removed. SEEK MEDICAL CARE IF:  Your pain gets worse.  You have more redness, swelling, or pain around your scrotum.  You have blood or fluid coming from your scrotum.  Your incision feels warm to the touch.  You have pus or a bad smell coming from your scrotum.  You have a fever.    AMBULATORY SURGERY  DISCHARGE INSTRUCTIONS   1) The drugs that you were given will stay in your system until tomorrow so for the next 24 hours you should not:  A) Drive an automobile B) Make any legal decisions C) Drink any alcoholic beverage   2) You may resume regular meals tomorrow.  Today it is better to start with liquids and gradually work up to solid foods.  You may eat anything you prefer, but it is better to start with liquids, then soup and crackers, and gradually work up to  solid foods.   3) Please notify your doctor immediately if you have any unusual bleeding, trouble breathing, redness and pain at the surgery site, drainage, fever, or pain not relieved by medication.    4) Additional Instructions:        Please contact your physician with any problems or Same Day Surgery at [phone], Monday through Friday 6 am to 4 pm, or [place] [facility] number at [phone].

## 2016-09-23 NOTE — Progress Notes (Signed)
ie pack applied to incision

## 2016-09-23 NOTE — OR Nursing (Signed)
150 ml drained from right hydrocele.

## 2016-09-23 NOTE — Transfer of Care (Signed)
Immediate Anesthesia Transfer of Care Note  Patient: Johnny Maddox  Procedure(s) Performed: Procedure(s) with comments: HYDROCELECTOMY ADULT  (Right) - INGUINAL APPROACH  Patient Location: PACU  Anesthesia Type:General  Level of Consciousness: awake and patient cooperative  Airway & Oxygen Therapy: Patient Spontanous Breathing and Patient connected to face mask oxygen  Post-op Assessment: Report given to RN and Post -op Vital signs reviewed and stable  Post vital signs: Reviewed and stable  Last Vitals:  Vitals:   09/23/16 0640 09/23/16 0900  BP: (!) 138/99 132/73  Pulse: 80 91  Resp: 18 16  Temp: (!) 36.1 C 37.1 C    Last Pain:  Vitals:   09/23/16 0640  TempSrc: Tympanic         Complications: No apparent anesthesia complications

## 2016-09-23 NOTE — Anesthesia Procedure Notes (Signed)
Procedure Name: Intubation Date/Time: 09/23/2016 7:50 AM Performed by: Rosaria Ferries, Imaan Padgett Pre-anesthesia Checklist: Patient identified, Emergency Drugs available, Suction available and Patient being monitored Patient Re-evaluated:Patient Re-evaluated prior to inductionOxygen Delivery Method: Circle system utilized Preoxygenation: Pre-oxygenation with 100% oxygen Intubation Type: IV induction Laryngoscope Size: Mac and 4 Grade View: Grade I Tube type: Oral Tube size: 7.0 mm Number of attempts: 1 Placement Confirmation: ETT inserted through vocal cords under direct vision,  positive ETCO2 and breath sounds checked- equal and bilateral Secured at: 22 cm Tube secured with: Tape Dental Injury: Teeth and Oropharynx as per pre-operative assessment

## 2016-09-23 NOTE — Anesthesia Preprocedure Evaluation (Signed)
Anesthesia Evaluation  Patient identified by MRN, date of birth, ID band Patient awake    Reviewed: Allergy & Precautions, NPO status , Patient's Chart, lab work & pertinent test results  History of Anesthesia Complications Negative for: history of anesthetic complications  Airway Mallampati: II       Dental   Pulmonary neg pulmonary ROS, sleep apnea ,           Cardiovascular hypertension, Pt. on medications + Peripheral Vascular Disease       Neuro/Psych negative neurological ROS     GI/Hepatic negative GI ROS, Neg liver ROS,   Endo/Other  diabetes, Type 2, Oral Hypoglycemic Agents, Insulin Dependent  Renal/GU negative Renal ROS     Musculoskeletal   Abdominal   Peds  Hematology negative hematology ROS (+)   Anesthesia Other Findings   Reproductive/Obstetrics                            Anesthesia Physical Anesthesia Plan  ASA: III  Anesthesia Plan: General   Post-op Pain Management:    Induction: Intravenous  Airway Management Planned: Oral ETT  Additional Equipment:   Intra-op Plan:   Post-operative Plan:   Informed Consent: I have reviewed the patients History and Physical, chart, labs and discussed the procedure including the risks, benefits and alternatives for the proposed anesthesia with the patient or authorized representative who has indicated his/her understanding and acceptance.     Plan Discussed with:   Anesthesia Plan Comments:         Anesthesia Quick Evaluation

## 2016-09-23 NOTE — Progress Notes (Signed)
Pt states pain is 6 now  But bearable and he wishes to go home   Dressing clean and dry

## 2016-09-23 NOTE — H&P (Signed)
08/21/2016  --> updated on AM of 09/23/16, see addendem 8:44 AM   Johnny Maddox [birth year] [id number]  Referring provider: Wardell Honour, MD [address] [place], [place] [address]      Chief Complaint  Patient presents with  . Testicular Mass    u/s results    HPI:  60 yo M with history of elevated PSA and Large right symptomatic hydrocele.  He presents today to discuss hydrocelectomy.  Right sided hydrocele is approximately softball size and is causing him discomfort in the form of scrotal heaviness and aching. It was then stable in size for at least over a year. He is anxious to have it treated at this point.  In the interim, he underwent repeat scrotal ultrasound which shows a newly identified 5 x 4 x 4 mm hypoechoic mass within the left testicle which was not previously appreciated on scrotal ultrasound from a year ago.    Murmurs are negative.  He denies a family history of testicular cancer.   PMH:     Past Medical History:  Diagnosis Date  . Acute laryngitis, without mention of obstruction   . Allergic rhinitis, cause unspecified   . Chalazion   . Cough   . Dermatophytosis of foot   . Diabetes mellitus without complication ([place])   . Elevated PSA   . Hyperlipidemia   . Hypertension   . Microalbuminuria   . Overweight   . Seborrheic dermatitis, unspecified   . Sleep apnea   . Unspecified disorder of autonomic nervous system   . Unspecified sleep apnea   . Unspecified venous (peripheral) insufficiency   . Unspecified vitamin D deficiency     Surgical History:      Past Surgical History:  Procedure Laterality Date  . CIRCUMCISION    . ROTATOR CUFF REPAIR  R 2011, L 2006   L and R shoulder  . TONSILLECTOMY AND ADENOIDECTOMY      Home Medications:        Medication List           Accurate as of 08/21/16 11:59 PM. Always use your most recent med list.           aspirin EC 81 MG tablet Take 81 mg by  mouth every evening.  atorvastatin 20 MG tablet Commonly known as:  LIPITOR Take 20 mg by mouth at bedtime.  atorvastatin 40 MG tablet Commonly known as:  LIPITOR Take 40 mg by mouth every evening.  B-D ULTRAFINE III SHORT PEN 31G X 8 MM Misc Generic drug:  Insulin Pen Needle See admin instructions.  CIALIS 20 MG tablet Generic drug:  tadalafil TAKE ONE TABLET BY MOUTH AS NEEDED FOR ERECTILE DYSFUNCTION  FIFTY50 GLUCOSE METER 2.0 w/Device Kit  finasteride 5 MG tablet Commonly known as:  PROSCAR TAKE 1 TABLET (5 MG TOTAL) BY MOUTH ONCE DAILY IN THE EVENING  furosemide 20 MG tablet Commonly known as:  LASIX Take 20 mg by mouth every evening. Reported on 11/22/2015  HUMALOG KWIKPEN 100 UNIT/ML KiwkPen Generic drug:  insulin lispro INJECT 12 UNITS SUBCUTANEOUSLY DAILY AT 7 AM.  ibuprofen 200 MG tablet Commonly known as:  ADVIL,MOTRIN Take 400-600 mg by mouth every 8 (eight) hours as needed (for pain.).  Insulin Glargine 100 UNIT/ML Solostar Pen Commonly known as:  LANTUS Inject 45 Units into the skin daily at 10 pm.  lisinopril 5 MG tablet Commonly known as:  PRINIVIL,ZESTRIL Take 5 mg by mouth every evening.  metFORMIN 1000 MG tablet Commonly  known as:  GLUCOPHAGE TAKE 1 TABLET (1,000 MG TOTAL) BY MOUTH 2 (TWO) TIMES DAILY WITH MEALS.  MULTIVITAMIN PO Take 1 tablet by mouth every evening.  ONETOUCH VERIO test strip Generic drug:  glucose blood Use 1 strip via meter twice daily as directed.  TRULICITY 1.5 FV/8.8QL Sopn Generic drug:  Dulaglutide Inject 1.5 mg into the skin every Saturday.      Allergies:       Allergies  Allergen Reactions  . Other Anaphylaxis    Mushrooms    Family History:       Family History  Problem Relation Age of Onset  . Cancer Mother     leukemia  . Hypertension Mother   . Alcohol abuse Father   . Hypertension Brother   . Prostate cancer Neg Hx   . Bladder Cancer Neg Hx     Social History:  reports that he has  never smoked. He has never used smokeless tobacco. He reports that he drinks about 1.2 oz of alcohol per week . He reports that he does not use drugs.  ROS: UROLOGY Frequent Urination?: No Hard to postpone urination?: No Burning/pain with urination?: No Get up at night to urinate?: No Leakage of urine?: No Urine stream starts and stops?: No Trouble starting stream?: No Do you have to strain to urinate?: No Blood in urine?: No Urinary tract infection?: No Sexually transmitted disease?: No Injury to kidneys or bladder?: No Painful intercourse?: No Weak stream?: No Erection problems?: No Penile pain?: No  Gastrointestinal Nausea?: No Vomiting?: No Indigestion/heartburn?: No Diarrhea?: No Constipation?: No  Constitutional Fever: No Night sweats?: No Weight loss?: No Fatigue?: No  Skin Skin rash/lesions?: No Itching?: No  Eyes Blurred vision?: No Double vision?: No  Ears/Nose/Throat Sore throat?: No Sinus problems?: No  Hematologic/Lymphatic Swollen glands?: No Easy bruising?: No  Cardiovascular Leg swelling?: No Chest pain?: No  Respiratory Cough?: No Shortness of breath?: No  Endocrine Excessive thirst?: Yes  Musculoskeletal Back pain?: No Joint pain?: No  Neurological Headaches?: No Dizziness?: No  Psychologic Depression?: No Anxiety?: No  Physical Exam: BP 124/81   Pulse 99   Ht 5' 10"  (1.778 m)   Wt 279 lb (126.6 kg)   BMI 40.03 kg/m   Constitutional:  Alert and oriented, No acute distress. HEENT: [place] AT, moist mucus membranes.  Trachea midline, no masses. Cardiovascular: No clubbing, cyanosis, or edema. RRR/ Respiratory: Normal respiratory effort, no increased work of breathing. CTAB. GI: Abdomen is soft, nontender, nondistended, no abdominal masses.  Obese. No inguinal hernias appreciated. GU: Left testicle normal. Mass appreciated on testicular ultrasound is not palpable. Nontender. Right enlarged hemiscrotum with  softball sized hydrocele. No overlying skin changes. Skin: No rashes, bruises or suspicious lesions. Lymph: No cervical or inguinal adenopathy. Neurologic: Grossly intact, no focal deficits, moving all 4 extremities. Psychiatric: Normal mood and affect.  Laboratory Data: RecentLabs       Lab Results  Component Value Date   WBC 5.5 12/28/2012   HGB 14.1 12/28/2012   HCT 42.7 12/28/2012   MCV 81.6 12/28/2012   PLT 152 12/28/2012      RecentLabs       Lab Results  Component Value Date   CREATININE 0.99 12/28/2012     Component     Latest Ref Rng & Units 08/13/2016  AFP Tumor Marker     0.0 - 8.3 ng/mL 4.1   Component     Latest Ref Rng & Units 08/13/2016  LDH  121 - 224 IU/L 141   Component     Latest Ref Rng & Units 08/13/2016  hCG Quant     0 - 3 mIU/mL <1   RecentLabs       Lab Results  Component Value Date   HGBA1C 7.8 09/28/2015      Pertinent Imaging: CLINICAL DATA: Hydrocele.  EXAM: SCROTAL ULTRASOUND  DOPPLER ULTRASOUND OF THE TESTICLES  TECHNIQUE: Complete ultrasound examination of the testicles, epididymis, and other scrotal structures was performed. Color and spectral Doppler ultrasound were also utilized to evaluate blood flow to the testicles.  COMPARISON: 05/12/2015.  FINDINGS: Right testicle  Measurements: 4.5 x 2.7 x 3.0 cm. No mass or microlithiasis visualized.  Left testicle  Measurements: 4.8 x 2.3 x 3.1 cm. No microlithiasis visualized. 5.0 x 4.0 x 4.0 mm hypoechoic mass left testicle. This is new from prior exam.  Right epididymis: 4.4 x 3.6 x 4.0 mm cyst.  Left epididymis: 4.1 x 0.1 x 3.4 mm cyst.  Hydrocele: Large 9.1 x 7.3 x 5.9 cm hydrocele. Similar findings noted on prior exam .  Varicocele: None visualized.  Pulsed Doppler interrogation of both testes demonstrates normal low resistance arterial and venous waveforms bilaterally.  IMPRESSION: 1. 5.0 x 4.0 x 4.0 mm  hypoechoic mass left testicle. This is new from prior exam. A small left testicular tumor cannot be excluded.  2. Large right hydrocele again noted. Similar findings noted on prior exam.  3. Small bilateral epididymal cysts are again noted.   Electronically Signed By: Marcello Moores Register On: 08/13/2016 06:49  Ultrasound was personally reviewed today with the patient.  Assessment & Plan:    1. Right hydrocele Symptomatic right hydrocele. We discussed alternatives today at length including pretty disease aspiration and hydrocelectomy. Risk and benefits of each were discussed as well as efficacy rates. Preoperative, intraoperative, postoperative course were discussed. We did discuss recovery and postoperative care at length today.  Risk of bleeding, infection, damage to surrounding structures, hematoma, and scrotal swelling and pain were discussed. We also discussed her need for possible drainage.  Recurrence of hydrocelectomy was also discussed.  In the setting of a possible left testicular mass, I have encouraged the patient to hold off on hydrocelectomy until we can decide how to manage the small lesion. If malignancy is suspected, may prefer not to violate the scrotal skin. Approach to be determined based on the follow-up scrotal ultrasound.  He is agreeable with this plan.    - [place] Art/Ven Flow Abd Pelv Doppler; Future - US Scrotum; Future  2. Testicular mass Newly identified 5 mm left hypoechoic mass within the testicle. Etiology of this is unclear. Tumor markers are negative.  Recommended repeat scrotal ultrasound in approximately 1 month to continue to follow this lesion. I suspect a benign pathology although malignancy cannot be ruled out at this point.   F/u scrotal ultrasound in 1 month --> will call with result.  Once this is complete, will arrange for right hydrocelectomy and possible intervention on left side if deemed appropriate.    Hollice Espy,  MD  [facility] [address], [place], [place] [address] [phone]   Addendum:  Scrotal ultrasound results reviewed with the patient. Small left intratesticular lesion appears unchanged. As such, we'll defer biopsy or orchiectomy at this time given relatively low suspicion. We'll continue to follow this likely with a scrotal ultrasound in 2-3 months following surgery.  For academic reasons, I explained that I prefer to do the right hydrocelectomy  through an inguinal incision as if he were ever to be diagnosed with testicular cancer down the road, there is concern for an alteration and lymphatic drainage. He understands that this might lead to slightly increased risk of being a hematoma but understands and is agreeable. All of his questions were answered today.  Hollice Espy, MD

## 2016-09-24 LAB — SURGICAL PATHOLOGY

## 2016-09-26 ENCOUNTER — Telehealth: Payer: Self-pay

## 2016-09-26 NOTE — Telephone Encounter (Signed)
Patient called wanting to know how long he should wear the jock strap given to him post op? He has been washing it and wearing it every day since surgery and wanted to know when he would go back to regular underwear. Patient states his normal underwear is boxer briefs. Please advise Thanks

## 2016-09-26 NOTE — Telephone Encounter (Signed)
Left patient a message to notify

## 2016-09-26 NOTE — Telephone Encounter (Signed)
Up to 1 week post op as tolerated.  Hollice Espy, MD

## 2016-09-30 ENCOUNTER — Telehealth: Payer: Self-pay

## 2016-09-30 NOTE — Telephone Encounter (Signed)
Pt called stating he is in severe pain. Pt stated that he has been taking the pain medication has prescribed without any relief. Advised pt to alternate tylenol and ibuprofen. Pt stated he is also doing that without any relief. Please advise.

## 2016-09-30 NOTE — Telephone Encounter (Signed)
Spoke with pt in reference to pain meds, scrotal support, and any redness or draining. Pt denied redness or draining. Pt stated he is currently wearing scrotal support. And pt stated he takes 2 pain pills at a time and then within 30 of pain meds take motrin. Pt elected to be seen tomorrow. Pt was transferred to the front to make appt.

## 2016-09-30 NOTE — Telephone Encounter (Signed)
What exactly is he taking?  He can take up to 2 tabs at at time.  He needs to go back to his scrotal support if not wearing.    Any redness or draining?  Perhaps he should be seen tomorrow afternoon.    Hollice Espy, MD

## 2016-10-01 ENCOUNTER — Ambulatory Visit: Payer: 59 | Admitting: Urology

## 2016-10-10 ENCOUNTER — Other Ambulatory Visit: Payer: 59

## 2016-10-11 ENCOUNTER — Encounter: Payer: Self-pay | Admitting: Urology

## 2016-10-11 ENCOUNTER — Ambulatory Visit (INDEPENDENT_AMBULATORY_CARE_PROVIDER_SITE_OTHER): Payer: 59 | Admitting: Urology

## 2016-10-11 VITALS — BP 123/77 | HR 105 | Ht 70.0 in | Wt 267.0 lb

## 2016-10-11 DIAGNOSIS — N433 Hydrocele, unspecified: Secondary | ICD-10-CM

## 2016-10-11 DIAGNOSIS — D4959 Neoplasm of unspecified behavior of other genitourinary organ: Secondary | ICD-10-CM

## 2016-10-11 NOTE — Progress Notes (Signed)
10/11/2016 2:31 PM   Johnny Maddox 1955/12/01 [id number]  Referring provider: Wardell Honour, MD [address] [place], [place] [address]  Chief Complaint  Patient presents with  . 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  He returns today to the office earlier than previously scheduled due to it tough postoperative course. He's had fairly significant pain in his groin and scrotum which has been limiting his activity. Unfortunately, his girlfriend who was supposed to help him postop broke up with him on the day of the procedure and he is been alone taking care of himself since. He is very distraught by this. He's had difficulty with his activities of daily living including cooking and cleaning due to discomfort. He slowly improved. He denies any significant swelling, drainage, or redness.     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 This was stable from scrotal ultrasound performed on 08/13/2016 to 09/16/2016.  Tumor markers negative.    He denies a family history of testicular cancer.   PMH: Past Medical History:  Diagnosis Date  . Acute laryngitis, without mention of obstruction   . Allergic rhinitis, cause unspecified   . Chalazion   . Cough   . Dermatophytosis of foot   . Diabetes mellitus without complication ([place])   . Elevated PSA   . Hyperlipidemia   . Hypertension   . Microalbuminuria   . Overweight   . Seborrheic dermatitis, unspecified   . Sleep apnea   . Unspecified disorder of autonomic nervous system   . Unspecified sleep apnea   . Unspecified venous (peripheral) insufficiency   . Unspecified vitamin D deficiency     Surgical History: Past Surgical History:  Procedure Laterality Date  .  CIRCUMCISION    . HYDROCELE EXCISION Right 09/23/2016   Procedure: HYDROCELECTOMY ADULT ;  Surgeon: Hollice Espy, MD;  Location: [facility] ORS;  Service: Urology;  Laterality: Right;  INGUINAL APPROACH  . ROTATOR CUFF REPAIR  R 2011, L 2006   L and R shoulder  . TONSILLECTOMY AND ADENOIDECTOMY      Home Medications:    Medication List       Accurate as of 10/11/16  2:31 PM. Always use your most recent med list.          aspirin EC 81 MG tablet Take 81 mg by mouth every evening.   atorvastatin 40 MG tablet Commonly known as:  LIPITOR Take 40 mg by mouth every evening.   CIALIS 20 MG tablet Generic drug:  tadalafil TAKE ONE TABLET BY MOUTH AS NEEDED FOR ERECTILE DYSFUNCTION   docusate sodium 100 MG capsule Commonly known as:  COLACE Take 1 capsule (100 mg total) by mouth 2 (two) times daily.   finasteride 5 MG tablet Commonly known as:  PROSCAR TAKE 1 TABLET (5 MG TOTAL) BY MOUTH ONCE DAILY IN THE EVENING   furosemide 20 MG tablet Commonly known as:  LASIX Take 20 mg by mouth every evening. Reported on 11/22/2015   HUMALOG KWIKPEN 100 UNIT/ML KiwkPen Generic drug:  insulin lispro INJECT 12 UNITS SUBCUTANEOUSLY DAILY AT 7 AM.   HYDROcodone-acetaminophen 5-325 MG tablet Commonly known as:  NORCO/VICODIN Take 1-2 tablets by mouth every 6 (six) hours as needed for moderate pain.  ibuprofen 200 MG tablet Commonly known as:  ADVIL,MOTRIN Take 400-600 mg by mouth every 8 (eight) hours as needed (for pain.).   Insulin Glargine 100 UNIT/ML Solostar Pen Commonly known as:  LANTUS Inject 45 Units into the skin daily at 10 pm.   lisinopril 5 MG tablet Commonly known as:  PRINIVIL,ZESTRIL Take 5 mg by mouth every evening.   metFORMIN 1000 MG tablet Commonly known as:  GLUCOPHAGE TAKE 1 TABLET (1,000 MG TOTAL) BY MOUTH 2 (TWO) TIMES DAILY WITH MEALS.   MULTIVITAMIN PO Take 1 tablet by mouth every evening.   TRULICITY 1.5 0000000 Sopn Generic drug:   Dulaglutide Inject 1.5 mg into the skin every Saturday.       Allergies:  Allergies  Allergen Reactions  . Other Anaphylaxis    Mushrooms    Family History: Family History  Problem Relation Age of Onset  . Cancer Mother     leukemia  . Hypertension Mother   . Alcohol abuse Father   . Hypertension Brother   . Prostate cancer Neg Hx   . Bladder Cancer Neg Hx     Social History:  reports that he has never smoked. He has never used smokeless tobacco. He reports that he drinks about 1.2 oz of alcohol per week . He reports that he does not use drugs.  ROS: UROLOGY Frequent Urination?: No Hard to postpone urination?: No Burning/pain with urination?: No Get up at night to urinate?: No Leakage of urine?: No Urine stream starts and stops?: No Trouble starting stream?: No Do you have to strain to urinate?: No Blood in urine?: No Urinary tract infection?: No Sexually transmitted disease?: No Injury to kidneys or bladder?: No Painful intercourse?: No Weak stream?: No Erection problems?: No Penile pain?: No  Gastrointestinal Nausea?: No Vomiting?: No Indigestion/heartburn?: No Diarrhea?: No Constipation?: No  Constitutional Fever: No Night sweats?: No Weight loss?: No Fatigue?: No  Skin Skin rash/lesions?: No Itching?: No  Eyes Blurred vision?: No Double vision?: No  Ears/Nose/Throat Sore throat?: No Sinus problems?: No  Hematologic/Lymphatic Swollen glands?: No Easy bruising?: No  Cardiovascular Leg swelling?: No Chest pain?: No  Respiratory Cough?: No Shortness of breath?: No  Endocrine Excessive thirst?: No  Musculoskeletal Back pain?: No Joint pain?: No  Neurological Headaches?: No Dizziness?: No  Psychologic Depression?: Yes Anxiety?: No  Physical Exam: BP 123/77 (BP Location: Left Arm, Patient Position: Sitting, Cuff Size: Large)   Pulse (!) 105   Ht 5\' 10"  (1.778 m)   Wt 267 lb (121.1 kg)   BMI 38.31 kg/m    Constitutional:  Alert and oriented, No acute distress. HEENT: [place] AT, moist mucus membranes.  Trachea midline, no masses. Cardiovascular: No clubbing, cyanosis, or edema. Respiratory: Normal respiratory effort, no increased work of breathing. GI: Abdomen is soft, nontender, nondistended, no abdominal masses.  Obese.  GU: Hydrocele on right side completely resolve, minimal scrotal swelling or edema. Buried uncircumcised phallus. Right inguinal incision well healed without erythema, fluctuance, or drainage. Left testicle palpably normal, no palpable abnormality or tumor. Skin: No rashes, bruises or suspicious lesions. Lymph: No inguinal adenopathy. Neurologic: Grossly intact, no focal deficits, moving all 4 extremities. Psychiatric: Normal mood and affect.  Laboratory Data: Lab Results  Component Value Date   WBC 5.5 12/28/2012   HGB 14.1 12/28/2012   HCT 42.7 12/28/2012   MCV 81.6 12/28/2012   PLT 152 12/28/2012    Lab Results  Component Value Date   CREATININE 0.89 09/09/2016   Component  Latest Ref Rng & Units 08/13/2016  AFP Tumor Marker     0.0 - 8.3 ng/mL 4.1   Component     Latest Ref Rng & Units 08/13/2016  LDH     121 - 224 IU/L 141   Component     Latest Ref Rng & Units 08/13/2016  hCG Quant     0 - 3 mIU/mL <1   Lab Results  Component Value Date   HGBA1C 7.8 09/28/2015    Pertinent Imaging: No new imaging since surgery  Assessment & Plan:    1. Right hydrocele Symptomatic right hydrocele s/p hydrocelectomy via inguinal approach given concern for possible left testicular lesion.   Healing well, no signs of infection Recommend supportive care in the form of scrotal support and NSAIDs as needed for ongoing discomfort  2. Testicular mass Newly identified 5 mm left hypoechoic mass within the testicle. Etiology of this is unclear. Tumor markers are negative. Recommend continuing to follow this lesion. We'll repeat scrotal ultrasound in approximate 6  weeks, 2 months from last ultrasound. If this remains stable, will spread out interval of imaging.  Return in about 2 months (around 12/12/2016).    Hollice Espy, MD  [facility] [address], [place], [place] [address] [phone]

## 2016-10-12 LAB — PSA: Prostate Specific Ag, Serum: 6.2 ng/mL — ABNORMAL HIGH (ref 0.0–4.0)

## 2016-10-17 ENCOUNTER — Encounter: Payer: Self-pay | Admitting: Urology

## 2016-10-17 ENCOUNTER — Ambulatory Visit (INDEPENDENT_AMBULATORY_CARE_PROVIDER_SITE_OTHER): Payer: 59 | Admitting: Urology

## 2016-10-17 VITALS — BP 151/76 | HR 112 | Ht 70.0 in | Wt 270.0 lb

## 2016-10-17 DIAGNOSIS — N509 Disorder of male genital organs, unspecified: Secondary | ICD-10-CM

## 2016-10-17 DIAGNOSIS — N138 Other obstructive and reflux uropathy: Secondary | ICD-10-CM

## 2016-10-17 DIAGNOSIS — N433 Hydrocele, unspecified: Secondary | ICD-10-CM

## 2016-10-17 DIAGNOSIS — N401 Enlarged prostate with lower urinary tract symptoms: Secondary | ICD-10-CM

## 2016-10-17 DIAGNOSIS — Z87898 Personal history of other specified conditions: Secondary | ICD-10-CM

## 2016-10-17 DIAGNOSIS — N5089 Other specified disorders of the male genital organs: Secondary | ICD-10-CM

## 2016-10-17 DIAGNOSIS — N529 Male erectile dysfunction, unspecified: Secondary | ICD-10-CM

## 2016-10-17 MED ORDER — SILDENAFIL CITRATE 20 MG PO TABS: ORAL_TABLET | ORAL | 3 refills | Status: DC

## 2016-10-17 MED ORDER — FINASTERIDE 5 MG PO TABS: 5.0000 mg | ORAL_TABLET | Freq: Every day | ORAL | 4 refills | Status: DC

## 2016-10-17 NOTE — Progress Notes (Signed)
10/17/2016 8:54 AM   Johnny Maddox 05-Nov-1956 [id number]  Referring provider: Wardell Honour, MD [address] [place], [place] [address]  Chief Complaint  Patient presents with  . Elevated PSA    6 month follow up   sp/ hydrocele excision and patient states should be talking about prostate value     HPI: Patient is a 60 year old African-American male with a history of elevated PSA, BPH with LUTS, erectile dysfunction and s/p hydrocele on 09/23/2016 who presents today for a 6 month follow up.    History of elevated PSA Patient's PSA has reduced from 4.6 ng/mL in 07/2014 to 2.5 ng/mL on 10/11/2015 on finasteride. Patient discontinued the finasteride for unknown reasons and his recent PSA returned at 4.0 ng/mL on 04/10/2016.  His current PSA was 6.2 ng/mL on 10/11/2016.  BPH WITH LUTS His IPSS score  was 13, which is moderate lower urinary tract symptomatology. He is pleased with his quality life due to his urinary symptoms. His previous IPSS score was 9/2.  He denies any dysuria, hematuria or suprapubic pain.   He also denies any recent fevers, chills, nausea or vomiting.  He does not have a family history of PCa.  He is currently on finasteride.        IPSS    Row Name 10/17/16 0800         International Prostate Symptom Score   How often have you had the sensation of not emptying your bladder? Not at All     How often have you had to urinate less than every two hours? Almost always     How often have you found you stopped and started again several times when you urinated? More than half the time     How often have you found it difficult to postpone urination? Not at All     How often have you had a weak urinary stream? Less than half the time     How often have you had to strain to start urination? Not at All     How many times did you typically get up at night to urinate? 2 Times     Total IPSS Score 13       Quality of Life due to urinary symptoms   If you were to spend  the rest of your life with your urinary condition just the way it is now how would you feel about that? Pleased      Erectile dysfunction His SHIM score was 7, which is severe erectile dysfunction.   His previous SHIM score was 12.  He has been having difficulty with erections for over 3 years .   His major complaint is maintaining an erection for the completion of satisfactory intercourse.  His libido is preserved.   His risk factors for ED are DM, age, BPH, HTN and vascular disease.  He denies any painful erections or curvatures with his erections.   He has tried PDE5-inhibitors in the past and found them either hit or miss and efficacy.  He is still having spontaneous erections.       SHIM    Row Name 10/17/16 0837         SHIM: Over the last 6 months:   How do you rate your confidence that you could get and keep an erection? Very Low     When you had erections with sexual stimulation, how often were your erections hard enough for penetration (entering your partner)? A  Few Times (much less than half the time)     During sexual intercourse, how often were you able to maintain your erection after you had penetrated (entered) your partner? Extremely Difficult     During sexual intercourse, how difficult was it to maintain your erection to completion of intercourse? Very Difficult     When you attempted sexual intercourse, how often was it satisfactory for you? Extremely Difficult       SHIM Total Score   SHIM 7       Right hydrocele Underwent hydrocelectomy on 09/23/2016.  Doing well.  Left testicular mass Not palpable.  Repeat scrotal ultrasound in 12/2016  PMH: Past Medical History:  Diagnosis Date  . Acute laryngitis, without mention of obstruction   . Allergic rhinitis, cause unspecified   . Chalazion   . Cough   . Dermatophytosis of foot   . Diabetes mellitus without complication ([place])   . Elevated PSA   . Hyperlipidemia   . Hypertension   . Microalbuminuria   .  Overweight   . Seborrheic dermatitis, unspecified   . Sleep apnea   . Unspecified disorder of autonomic nervous system   . Unspecified sleep apnea   . Unspecified venous (peripheral) insufficiency   . Unspecified vitamin D deficiency     Surgical History: Past Surgical History:  Procedure Laterality Date  . CIRCUMCISION    . HYDROCELE EXCISION Right 09/23/2016   Procedure: HYDROCELECTOMY ADULT ;  Surgeon: Hollice Espy, MD;  Location: [facility] ORS;  Service: Urology;  Laterality: Right;  INGUINAL APPROACH  . ROTATOR CUFF REPAIR  R 2011, L 2006   L and R shoulder  . TONSILLECTOMY AND ADENOIDECTOMY      Home Medications:    Medication List       Accurate as of 10/17/16  8:54 AM. Always use your most recent med list.          aspirin EC 81 MG tablet Take 81 mg by mouth every evening.   atorvastatin 40 MG tablet Commonly known as:  LIPITOR Take 40 mg by mouth every evening.   CIALIS 20 MG tablet Generic drug:  tadalafil TAKE ONE TABLET BY MOUTH AS NEEDED FOR ERECTILE DYSFUNCTION   docusate sodium 100 MG capsule Commonly known as:  COLACE Take 1 capsule (100 mg total) by mouth 2 (two) times daily.   finasteride 5 MG tablet Commonly known as:  PROSCAR Take 1 tablet (5 mg total) by mouth daily.   furosemide 20 MG tablet Commonly known as:  LASIX Take 20 mg by mouth every evening. Reported on 11/22/2015   HUMALOG KWIKPEN 100 UNIT/ML KiwkPen Generic drug:  insulin lispro INJECT 12 UNITS SUBCUTANEOUSLY DAILY AT 7 AM.   HYDROcodone-acetaminophen 5-325 MG tablet Commonly known as:  NORCO/VICODIN Take 1-2 tablets by mouth every 6 (six) hours as needed for moderate pain.   ibuprofen 200 MG tablet Commonly known as:  ADVIL,MOTRIN Take 400-600 mg by mouth every 8 (eight) hours as needed (for pain.).   Insulin Glargine 100 UNIT/ML Solostar Pen Commonly known as:  LANTUS Inject 45 Units into the skin daily at 10 pm.   lisinopril 5 MG tablet Commonly known as:   PRINIVIL,ZESTRIL Take 5 mg by mouth every evening.   metFORMIN 1000 MG tablet Commonly known as:  GLUCOPHAGE TAKE 1 TABLET (1,000 MG TOTAL) BY MOUTH 2 (TWO) TIMES DAILY WITH MEALS.   MULTIVITAMIN PO Take 1 tablet by mouth every evening.   sildenafil 20 MG tablet Commonly known as:  REVATIO Take 3 to 5 tablets two hours before intercouse on an empty stomach.  Do not take with nitrates.   TRULICITY 1.5 0000000 Sopn Generic drug:  Dulaglutide Inject 1.5 mg into the skin every Saturday.       Allergies:  Allergies  Allergen Reactions  . Other Anaphylaxis    Mushrooms    Family History: Family History  Problem Relation Age of Onset  . Cancer Mother     leukemia  . Hypertension Mother   . Alcohol abuse Father   . Hypertension Brother   . Prostate cancer Neg Hx   . Bladder Cancer Neg Hx   . Kidney disease Neg Hx     Social History:  reports that he has never smoked. He has never used smokeless tobacco. He reports that he drinks about 1.2 oz of alcohol per week . He reports that he does not use drugs.  ROS: UROLOGY Frequent Urination?: No Hard to postpone urination?: No Burning/pain with urination?: No Get up at night to urinate?: No Leakage of urine?: No Urine stream starts and stops?: No Trouble starting stream?: No Do you have to strain to urinate?: No Blood in urine?: No Urinary tract infection?: No Sexually transmitted disease?: No Injury to kidneys or bladder?: No Painful intercourse?: No Weak stream?: No Erection problems?: Yes Penile pain?: No  Gastrointestinal Nausea?: No Vomiting?: No Indigestion/heartburn?: No Diarrhea?: No Constipation?: No  Constitutional Fever: No Night sweats?: No Weight loss?: No Fatigue?: No  Skin Skin rash/lesions?: No Itching?: No  Eyes Blurred vision?: No Double vision?: No  Ears/Nose/Throat Sore throat?: No Sinus problems?: No  Hematologic/Lymphatic Swollen glands?: No Easy bruising?:  No  Cardiovascular Leg swelling?: No Chest pain?: No  Respiratory Cough?: No Shortness of breath?: No  Endocrine Excessive thirst?: No  Musculoskeletal Back pain?: No Joint pain?: No  Neurological Headaches?: No Dizziness?: No  Psychologic Depression?: No Anxiety?: No  Physical Exam: BP (!) 151/76   Pulse (!) 112   Ht 5\' 10"  (1.778 m)   Wt 270 lb (122.5 kg)   BMI 38.74 kg/m   Constitutional: Well nourished. Alert and oriented, No acute distress. HEENT: [place] AT, moist mucus membranes. Trachea midline, no masses. Cardiovascular: No clubbing, cyanosis, or edema. Respiratory: Normal respiratory effort, no increased work of breathing. GI: Abdomen is soft, non tender, non distended, no abdominal masses. Liver and spleen not palpable.  No hernias appreciated.  Stool sample for occult testing is not indicated.   GU: No CVA tenderness.  No bladder fullness or masses.  Patient with circumcised phallus.  Urethral meatus is patent.  No penile discharge. No penile lesions or rashes. Scrotum without lesions, cysts, rashes and/or edema.  Left testicle is scrotal relocated no masses appreciated. Left epididymitis is normal.  Hydrocele on right side completely resolve, minimal scrotal swelling or edema.  Right inguinal incision well healed without erythema, fluctuance, or drainage.  Rectal: Patient with  normal sphincter tone. Anus and perineum without scarring or rashes. No rectal masses are appreciated. Prostate is approximately 50 grams, no nodules are appreciated. Seminal vesicles are normal. Skin: No rashes, bruises or suspicious lesions. Lymph: No cervical or inguinal adenopathy. Neurologic: Grossly intact, no focal deficits, moving all 4 extremities. Psychiatric: Normal mood and affect.  Laboratory Data: Lab Results  Component Value Date   WBC 5.5 12/28/2012   HGB 14.1 12/28/2012   HCT 42.7 12/28/2012   MCV 81.6 12/28/2012   PLT 152 12/28/2012    Lab Results  Component  Value  Date   CREATININE 0.89 09/09/2016     Lab Results  Component Value Date   HGBA1C 7.8 09/28/2015   PSA History  2.6 ng/mL on 05/08/2015  2.5 ng/mL on 10/11/2015  4.4 ng/mL on 04/03/2016-patient stopped finasteride 6 months ago  4.0 ng/mL on 04/10/2016  6.2 ng/mL on 10/11/2016        Component Value Date/Time   CHOL 183 12/28/2012 1230   HDL 41 12/28/2012 1230   CHOLHDL 4.5 12/28/2012 1230   VLDL 26 12/28/2012 1230   LDLCALC 116 (H) 12/28/2012 1230    Lab Results  Component Value Date   AST 21 12/28/2012   Lab Results  Component Value Date   ALT 29 12/28/2012   Assessment & Plan:    1. History of elevated PSA  - Current PSA is 6.2 ng/mL on 10/11/2016  - Repeat in 12/2016 when returns for scrotal ultrasound report  2. BPH with LUTS  - IPSS score is 13/1, it is worsening  - Continue conservative management, avoiding bladder irritants and timed voiding's  - Continue finasteride 5 mg daily; refills given  - RTC in 12/2016 for PSA  3. Erectile dysfunction  - SHIM score is 7  - Script given for sildenafil 20 mg  - Strendra samples given  5. Right hydrocele  - Symptomatic right hydrocele s/p hydrocelectomy via inguinal approach given concern for possible left testicular lesion.    - Healing well, no signs of infection  - Recommend supportive care in the form of scrotal support and NSAIDs as needed for ongoing discomfort  6. Testicular mass  - Newly identified 5 mm left hypoechoic mass within the testicle. Etiology of this is unclear. Tumor markers are negative.  - Recommend continuing to follow this lesion. We'll repeat scrotal ultrasound in 12/2016    Return for scrotal ultrasound report and PSA in 12/2016.  These notes generated with voice recognition software. I apologize for typographical errors.  Zara Council, [facility] [address] [place], [place] [address] [phone]

## 2016-10-24 ENCOUNTER — Ambulatory Visit: Payer: 59 | Admitting: Urology

## 2016-11-22 ENCOUNTER — Ambulatory Visit
Admission: RE | Admit: 2016-11-22 | Discharge: 2016-11-22 | Disposition: A | Discharge status: Home / Self Care | Payer: 59 | Source: Ambulatory Visit | Attending: Urology | Admitting: Urology

## 2016-11-22 DIAGNOSIS — D4959 Neoplasm of unspecified behavior of other genitourinary organ: Secondary | ICD-10-CM

## 2016-11-22 DIAGNOSIS — N433 Hydrocele, unspecified: Secondary | ICD-10-CM | POA: Diagnosis not present

## 2016-12-04 ENCOUNTER — Encounter: Payer: Self-pay | Admitting: Urology

## 2016-12-04 ENCOUNTER — Ambulatory Visit: Payer: 59 | Admitting: Urology

## 2016-12-04 VITALS — BP 130/83 | HR 106 | Ht 70.0 in | Wt 268.0 lb

## 2016-12-04 DIAGNOSIS — N433 Hydrocele, unspecified: Secondary | ICD-10-CM

## 2016-12-04 NOTE — Progress Notes (Signed)
12/04/2016 8:59 AM   Johnny Maddox February 03, 1956 [id number]  Referring provider: Wardell Honour, MD [address] [place], [place] [address]  Chief Complaint  Patient presents with  . 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 This was stable from scrotal ultrasound performed on 08/13/2016 to 09/16/2016.  Today Frequency stable. No scrotal pain and incision well-healed Swelling completely gone and he feels well  Ultrasound demonstrates stable hypoechoic area and left testicle   PMH: Past Medical History:  Diagnosis Date  . Acute laryngitis, without mention of obstruction   . Allergic rhinitis, cause unspecified   . Chalazion   . Cough   . Dermatophytosis of foot   . Diabetes mellitus without complication ([place])   . Elevated PSA   . Hyperlipidemia   . Hypertension   . Microalbuminuria   . Overweight   . Seborrheic dermatitis, unspecified   . Sleep apnea   . Unspecified disorder of autonomic nervous system   . Unspecified sleep apnea   . Unspecified venous (peripheral) insufficiency   . Unspecified vitamin D deficiency     Surgical History: Past Surgical History:  Procedure Laterality Date  . CIRCUMCISION    . HYDROCELE EXCISION Right 09/23/2016   Procedure: HYDROCELECTOMY ADULT ;  Surgeon: Hollice Espy, MD;  Location: [facility] ORS;  Service: Urology;  Laterality: Right;  INGUINAL APPROACH  . ROTATOR CUFF REPAIR  R 2011, L 2006   L and R shoulder  . TONSILLECTOMY AND ADENOIDECTOMY      Home Medications:  Allergies as of 12/04/2016      Reactions   Other Anaphylaxis   Mushrooms      Medication List         Accurate as of 12/04/16  8:59 AM. Always use your most recent med list.          aspirin EC 81 MG tablet Take 81 mg by mouth every evening.   atorvastatin 40 MG tablet Commonly known as:  LIPITOR Take 40 mg by mouth every evening.   CIALIS 20 MG tablet Generic drug:  tadalafil TAKE ONE TABLET BY MOUTH AS NEEDED FOR ERECTILE DYSFUNCTION   docusate sodium 100 MG capsule Commonly known as:  COLACE Take 1 capsule (100 mg total) by mouth 2 (two) times daily.   finasteride 5 MG tablet Commonly known as:  PROSCAR Take 1 tablet (5 mg total) by mouth daily.   furosemide 20 MG tablet Commonly known as:  LASIX Take 20 mg by mouth every evening. Reported on 11/22/2015   HUMALOG KWIKPEN 100 UNIT/ML KiwkPen Generic drug:  insulin lispro INJECT 12 UNITS SUBCUTANEOUSLY DAILY AT 7 AM.   Insulin Glargine 100 UNIT/ML Solostar Pen Commonly known as:  LANTUS Inject 45 Units into the skin daily at 10 pm.   lisinopril 5 MG tablet Commonly known as:  PRINIVIL,ZESTRIL Take 5 mg by mouth every evening.   metFORMIN 1000 MG tablet Commonly known as:  GLUCOPHAGE TAKE 1 TABLET (1,000 MG TOTAL) BY MOUTH 2 (TWO) TIMES DAILY WITH MEALS.   MULTIVITAMIN PO Take 1 tablet by mouth every evening.   sildenafil 20 MG tablet Commonly known as:  REVATIO  Take 3 to 5 tablets two hours before intercouse on an empty stomach.  Do not take with nitrates.   TRULICITY 1.5 0000000 Sopn Generic drug:  Dulaglutide Inject 1.5 mg into the skin every Saturday.       Allergies:  Allergies  Allergen Reactions  . Other Anaphylaxis    Mushrooms    Family History: Family History  Problem Relation Age of Onset  . Cancer Mother     leukemia  . Hypertension Mother   . Alcohol abuse Father   . Hypertension Brother   . Prostate cancer Neg Hx   . Bladder Cancer Neg Hx   . Kidney disease Neg Hx     Social History:  reports that he has never smoked. He has never used smokeless tobacco. He reports that he  drinks about 1.2 oz of alcohol per week . He reports that he does not use drugs.  ROS: UROLOGY Frequent Urination?: No Hard to postpone urination?: No Burning/pain with urination?: No Get up at night to urinate?: No Leakage of urine?: No Urine stream starts and stops?: No Trouble starting stream?: No Do you have to strain to urinate?: No Blood in urine?: No Urinary tract infection?: No Sexually transmitted disease?: No Injury to kidneys or bladder?: No Painful intercourse?: No Weak stream?: No Erection problems?: No Penile pain?: No  Gastrointestinal Nausea?: No Vomiting?: No Indigestion/heartburn?: No Diarrhea?: No Constipation?: No  Constitutional Fever: No Night sweats?: No Weight loss?: No Fatigue?: No  Skin Skin rash/lesions?: No Itching?: No  Eyes Blurred vision?: No Double vision?: No  Ears/Nose/Throat Sore throat?: No Sinus problems?: No  Hematologic/Lymphatic Swollen glands?: No Easy bruising?: No  Cardiovascular Leg swelling?: No Chest pain?: No  Respiratory Cough?: No Shortness of breath?: No  Endocrine Excessive thirst?: No  Musculoskeletal Back pain?: No Joint pain?: No  Neurological Headaches?: No Dizziness?: No  Psychologic Depression?: No Anxiety?: No  Physical Exam: BP 130/83   Pulse (!) 106   Ht 5\' 10"  (1.778 m)   Wt 268 lb (121.6 kg)   BMI 38.45 kg/m    Laboratory Data: Lab Results  Component Value Date   WBC 5.5 12/28/2012   HGB 14.1 12/28/2012   HCT 42.7 12/28/2012   MCV 81.6 12/28/2012   PLT 152 12/28/2012    Lab Results  Component Value Date   CREATININE 0.89 09/09/2016    No results found for: PSA  No results found for: TESTOSTERONE  Lab Results  Component Value Date   HGBA1C 7.8 09/28/2015    Urinalysis    Component Value Date/Time   APPEARANCEUR Clear 06/06/2015 0920   GLUCOSEU 3+ (A) 06/06/2015 0920   BILIRUBINUR Negative 06/06/2015 0920   PROTEINUR Negative 06/06/2015 0920    NITRITE Negative 06/06/2015 0920   LEUKOCYTESUR Negative 06/06/2015 0920    Pertinent Imaging: See above  Assessment & Plan:  The patient has done very well from the hydrocelectomy. The left testicular lesion is stable. I spoke to Dr. Erlene Quan and we both agreed that for him to have it re-ultrasounded in 6 months. I reviewed testicular examinations with him  At his request a prescription for generic Viagra with pros and cons and risks discussed.  There are no diagnoses linked to this encounter.  No Follow-up on file.  Reece Packer, MD  [facility] [address] [place], [place] [address] [phone]

## 2016-12-18 DIAGNOSIS — Z794 Long term (current) use of insulin: Secondary | ICD-10-CM | POA: Diagnosis not present

## 2016-12-18 DIAGNOSIS — E1165 Type 2 diabetes mellitus with hyperglycemia: Secondary | ICD-10-CM | POA: Diagnosis not present

## 2016-12-24 ENCOUNTER — Other Ambulatory Visit: Payer: 59

## 2016-12-25 DIAGNOSIS — Z794 Long term (current) use of insulin: Secondary | ICD-10-CM | POA: Diagnosis not present

## 2016-12-25 DIAGNOSIS — Z79899 Other long term (current) drug therapy: Secondary | ICD-10-CM | POA: Diagnosis not present

## 2016-12-25 DIAGNOSIS — E1165 Type 2 diabetes mellitus with hyperglycemia: Secondary | ICD-10-CM | POA: Diagnosis not present

## 2016-12-26 ENCOUNTER — Ambulatory Visit: Payer: 59 | Admitting: Urology

## 2016-12-27 DIAGNOSIS — H2513 Age-related nuclear cataract, bilateral: Secondary | ICD-10-CM | POA: Diagnosis not present

## 2016-12-27 DIAGNOSIS — E119 Type 2 diabetes mellitus without complications: Secondary | ICD-10-CM | POA: Diagnosis not present

## 2016-12-27 DIAGNOSIS — H04123 Dry eye syndrome of bilateral lacrimal glands: Secondary | ICD-10-CM | POA: Diagnosis not present

## 2017-02-03 NOTE — Progress Notes (Signed)
02/04/2017 9:23 AM   Johnny Maddox 1956-02-02 [id number]  Referring provider: Wardell Honour, MD [address] [place], [place] [address]  Chief Complaint  Patient presents with  . Erectile Dysfunction    last seen 12/04/2016    HPI: Patient is a 61 year old African-American male with an elevated PSA, BPH with LUTS, erectile dysfunction and s/p hydrocele on 09/23/2016 who presents today for a 6 month follow up.    History of elevated PSA Patient's PSA has reduced from 4.6 ng/mL in 07/2014 to 2.5 ng/mL on 10/11/2015 on finasteride. Patient discontinued the finasteride for unknown reasons and his PSA returned at 4.0 ng/mL on 04/10/2016.  His recent PSA was 6.2 ng/mL on 10/11/2016.  BPH WITH LUTS His IPSS score  was 15, which is moderate lower urinary tract symptomatology. He is mostly satisfied with his quality life due to his urinary symptoms.  His main complaints are frequency and nocturia x 3.  His PVR is 27 mL.  His previous IPSS score was 13/2.  He denies any dysuria, hematuria or suprapubic pain.   He also denies any recent fevers, chills, nausea or vomiting.  He does not have a family history of PCa.  He is currently on finasteride.      IPSS    Row Name 02/04/17 0800         International Prostate Symptom Score   How often have you had the sensation of not emptying your bladder? Less than half the time     How often have you had to urinate less than every two hours? More than half the time     How often have you found you stopped and started again several times when you urinated? Less than half the time     How often have you found it difficult to postpone urination? Less than half the time     How often have you had a weak urinary stream? Less than 1 in 5 times     How often have you had to strain to start urination? Less than 1 in 5 times     How many times did you typically get up at night to urinate? 3 Times     Total IPSS Score 15       Quality of Life due to  urinary symptoms   If you were to spend the rest of your life with your urinary condition just the way it is now how would you feel about that? Mostly Satisfied      Erectile dysfunction His SHIM score was 6, which is severe erectile dysfunction.   His previous SHIM score was 7.  He has been having difficulty with erections for over 4 years .   His major complaint is maintaining an erection for the completion of satisfactory intercourse.  His libido is preserved.   His risk factors for ED are DM, age, BPH, HTN and vascular disease.  He denies any painful erections or curvatures with his erections.   He has tried PDE5-inhibitors in the past and found them either hit or miss and efficacy.  He is still having spontaneous erections.       SHIM    Row Name 02/04/17 0856         SHIM: Over the last 6 months:   How do you rate your confidence that you could get and keep an erection? Very Low     When you had erections with sexual stimulation, how often were your  erections hard enough for penetration (entering your partner)? A Few Times (much less than half the time)     During sexual intercourse, how often were you able to maintain your erection after you had penetrated (entered) your partner? Almost Never or Never     During sexual intercourse, how difficult was it to maintain your erection to completion of intercourse? Extremely Difficult     When you attempted sexual intercourse, how often was it satisfactory for you? Almost Never or Never       SHIM Total Score   SHIM 6       Right hydrocele Underwent hydrocelectomy on 09/23/2016.  Doing well.  Left testicular mass Not palpable.  Repeat scrotal ultrasound in 11/2016 - small hypoechoic lesion along the peripheral aspect of the lower left testicle is stable. This supports a benign lesion. Recommend additional follow-up to document longer term surveillance with repeat ultrasound in 6 months.  No residual hydrocele following drainage of the right  hydrocele.  3 mm right epididymal head cyst.  Exam otherwise unremarkable.   PMH: Past Medical History:  Diagnosis Date  . Acute laryngitis, without mention of obstruction   . Allergic rhinitis, cause unspecified   . Chalazion   . Cough   . Dermatophytosis of foot   . Diabetes mellitus without complication ([place])   . Elevated PSA   . Hyperlipidemia   . Hypertension   . Microalbuminuria   . Overweight   . Seborrheic dermatitis, unspecified   . Sleep apnea   . Unspecified disorder of autonomic nervous system   . Unspecified sleep apnea   . Unspecified venous (peripheral) insufficiency   . Unspecified vitamin D deficiency     Surgical History: Past Surgical History:  Procedure Laterality Date  . CIRCUMCISION    . HYDROCELE EXCISION Right 09/23/2016   Procedure: HYDROCELECTOMY ADULT ;  Surgeon: Hollice Espy, MD;  Location: [facility] ORS;  Service: Urology;  Laterality: Right;  INGUINAL APPROACH  . ROTATOR CUFF REPAIR  R 2011, L 2006   L and R shoulder  . TONSILLECTOMY AND ADENOIDECTOMY      Home Medications:  Allergies as of 02/04/2017      Reactions   Other Anaphylaxis   Mushrooms      Medication List       Accurate as of 02/04/17  9:23 AM. Always use your most recent med list.          aspirin EC 81 MG tablet Take 81 mg by mouth every evening.   atorvastatin 40 MG tablet Commonly known as:  LIPITOR Take 40 mg by mouth every evening.   CIALIS 20 MG tablet Generic drug:  tadalafil TAKE ONE TABLET BY MOUTH AS NEEDED FOR ERECTILE DYSFUNCTION   docusate sodium 100 MG capsule Commonly known as:  COLACE Take 1 capsule (100 mg total) by mouth 2 (two) times daily.   finasteride 5 MG tablet Commonly known as:  PROSCAR Take 1 tablet (5 mg total) by mouth daily.   FREESTYLE LIBRE READER Devi Use 1 Units as directed.   furosemide 20 MG tablet Commonly known as:  LASIX Take 20 mg by mouth every evening. Reported on 11/22/2015   HUMALOG KWIKPEN 100 UNIT/ML  KiwkPen Generic drug:  insulin lispro INJECT 12 UNITS SUBCUTANEOUSLY DAILY AT 7 AM.   Insulin Glargine 100 UNIT/ML Solostar Pen Commonly known as:  LANTUS Inject 45 Units into the skin daily at 10 pm.   lisinopril 5 MG tablet Commonly known as:  PRINIVIL,ZESTRIL Take  5 mg by mouth every evening.   metFORMIN 1000 MG tablet Commonly known as:  GLUCOPHAGE TAKE 1 TABLET (1,000 MG TOTAL) BY MOUTH 2 (TWO) TIMES DAILY WITH MEALS.   MULTI-VITAMINS Tabs Take by mouth.   MULTIVITAMIN PO Take 1 tablet by mouth every evening.   sildenafil 20 MG tablet Commonly known as:  REVATIO Take 3 to 5 tablets two hours before intercouse on an empty stomach.  Do not take with nitrates.   TRULICITY 1.5 ZO/1.0RU Sopn Generic drug:  Dulaglutide Inject 1.5 mg into the skin every Saturday.       Allergies:  Allergies  Allergen Reactions  . Other Anaphylaxis    Mushrooms    Family History: Family History  Problem Relation Age of Onset  . Cancer Mother     leukemia  . Hypertension Mother   . Alcohol abuse Father   . Hypertension Brother   . Prostate cancer Neg Hx   . Bladder Cancer Neg Hx   . Kidney disease Neg Hx   . Kidney cancer Neg Hx     Social History:  reports that he has quit smoking. He has never used smokeless tobacco. He reports that he drinks about 1.2 oz of alcohol per week . He reports that he does not use drugs.  ROS: UROLOGY Frequent Urination?: No Hard to postpone urination?: No Burning/pain with urination?: No Get up at night to urinate?: No Leakage of urine?: No Urine stream starts and stops?: No Trouble starting stream?: No Do you have to strain to urinate?: No Blood in urine?: No Urinary tract infection?: No Sexually transmitted disease?: No Injury to kidneys or bladder?: No Painful intercourse?: No Weak stream?: No Erection problems?: Yes Penile pain?: No  Gastrointestinal Nausea?: No Vomiting?: No Indigestion/heartburn?: No Diarrhea?:  No Constipation?: No  Constitutional Fever: No Night sweats?: No Weight loss?: No Fatigue?: No  Skin Skin rash/lesions?: No Itching?: No  Eyes Blurred vision?: No Double vision?: No  Ears/Nose/Throat Sore throat?: No Sinus problems?: No  Hematologic/Lymphatic Swollen glands?: No Easy bruising?: No  Cardiovascular Leg swelling?: No Chest pain?: No  Respiratory Cough?: No Shortness of breath?: No  Endocrine Excessive thirst?: No  Musculoskeletal Back pain?: No Joint pain?: No  Neurological Headaches?: No Dizziness?: No  Psychologic Depression?: No Anxiety?: No  Physical Exam: BP 124/80   Pulse 94   Ht 5\' 9"  (1.753 m)   Wt 271 lb 3.2 oz (123 kg)   BMI 40.05 kg/m   Constitutional: Well nourished. Alert and oriented, No acute distress. HEENT: [place] AT, moist mucus membranes. Trachea midline, no masses. Cardiovascular: No clubbing, cyanosis, or edema. Respiratory: Normal respiratory effort, no increased work of breathing. GI: Abdomen is soft, non tender, non distended, no abdominal masses. Liver and spleen not palpable.  No hernias appreciated.  Stool sample for occult testing is not indicated.   GU: No CVA tenderness.  No bladder fullness or masses.  Patient with circumcised phallus.  Urethral meatus is patent.  No penile discharge. No penile lesions or rashes. Scrotum without lesions, cysts, rashes and/or edema.  Left testicle is scrotal relocated no masses appreciated. Left epididymitis is normal.  Hydrocele on right side completely resolve, minimal scrotal swelling or edema.  Right inguinal incision well healed without erythema, fluctuance, or drainage.  Rectal: Patient with  normal sphincter tone. Anus and perineum without scarring or rashes. No rectal masses are appreciated. Prostate is approximately 50 grams, no nodules are appreciated. Seminal vesicles are normal. Skin: No rashes, bruises or  suspicious lesions. Lymph: No cervical or inguinal  adenopathy. Neurologic: Grossly intact, no focal deficits, moving all 4 extremities. Psychiatric: Normal mood and affect.  Laboratory Data: Lab Results  Component Value Date   WBC 5.5 12/28/2012   HGB 14.1 12/28/2012   HCT 42.7 12/28/2012   MCV 81.6 12/28/2012   PLT 152 12/28/2012    Lab Results  Component Value Date   CREATININE 0.89 09/09/2016     Lab Results  Component Value Date   HGBA1C 7.8 09/28/2015   PSA History  2.6 ng/mL on 05/08/2015  2.5 ng/mL on 10/11/2015  4.4 ng/mL on 04/03/2016-patient stopped finasteride 6 months ago  4.0 ng/mL on 04/10/2016  6.2 ng/mL on 10/11/2016        Component Value Date/Time   CHOL 183 12/28/2012 1230   HDL 41 12/28/2012 1230   CHOLHDL 4.5 12/28/2012 1230   VLDL 26 12/28/2012 1230   LDLCALC 116 (H) 12/28/2012 1230    Lab Results  Component Value Date   AST 21 12/28/2012   Lab Results  Component Value Date   ALT 29 12/28/2012   Pertinent Imaging Results for Johnny Maddox, Johnny Maddox (MRN [id number]) as of 02/04/2017 09:13  Ref. Range 02/04/2017 09:06  Scan Result Unknown 27    Assessment & Plan:    1. Elevated PSA  - Current PSA is 6.2 ng/mL on 10/11/2016  - PSA repeated today  2. BPH with LUTS  - IPSS score is 15/2, it is worsening  - Continue conservative management, avoiding bladder irritants and timed voiding's  - most bothersome symptoms is/are frequency and nocturia  - Continue finasteride 5 mg daily; refills given  - RTC pending labs  3. Erectile dysfunction  - SHIM score is 6, it is improving  - I explained to the patient that in order to achieve an erection it takes good functioning of the nervous system (parasympathetic, sympathetic, sensory and motor), good blood flow into the erectile tissue of the penis and a desire to have sex  - I explained that conditions like diabetes, hypertension, coronary artery disease, peripheral vascular disease, smoking, alcohol consumption, age, sleep apnea and BPH can diminish  the ability to have an erection  - We discussed trying a different PDE5 inhibitor, intra-urethral suppositories, intracavernous vasoactive drug injection therapy, vacuum constriction device and penile prosthesis implantation  - He would like to try Trimix  - RTC for test dose of Trimix  4. Right hydrocele  - Symptomatic right hydrocele s/p hydrocelectomy via inguinal approach given concern for possible left testicular lesion.    - Healing well, no signs of infection  - Recommend supportive care in the form of scrotal support and NSAIDs as needed for ongoing discomfort  5. Testicular mass  - 5 mm left hypoechoic mass within the testicle, so far stable - etiology of this is unclear. Tumor markers are negative.  - Recommend continuing to follow this lesion. We'll repeat scrotal ultrasound in 04/2017   Return for pending labs.  These notes generated with voice recognition software. I apologize for typographical errors.  Zara Council, [facility] [address] [place], [place] [address] [phone]

## 2017-02-04 ENCOUNTER — Encounter: Payer: Self-pay | Admitting: Urology

## 2017-02-04 ENCOUNTER — Telehealth: Payer: Self-pay | Admitting: Urology

## 2017-02-04 ENCOUNTER — Ambulatory Visit: Payer: 59 | Admitting: Urology

## 2017-02-04 VITALS — BP 124/80 | HR 94 | Ht 69.0 in | Wt 271.2 lb

## 2017-02-04 DIAGNOSIS — R972 Elevated prostate specific antigen [PSA]: Secondary | ICD-10-CM | POA: Diagnosis not present

## 2017-02-04 DIAGNOSIS — Z9889 Other specified postprocedural states: Secondary | ICD-10-CM

## 2017-02-04 DIAGNOSIS — N509 Disorder of male genital organs, unspecified: Secondary | ICD-10-CM

## 2017-02-04 DIAGNOSIS — N138 Other obstructive and reflux uropathy: Secondary | ICD-10-CM

## 2017-02-04 DIAGNOSIS — N401 Enlarged prostate with lower urinary tract symptoms: Secondary | ICD-10-CM

## 2017-02-04 DIAGNOSIS — N529 Male erectile dysfunction, unspecified: Secondary | ICD-10-CM | POA: Diagnosis not present

## 2017-02-04 DIAGNOSIS — N5089 Other specified disorders of the male genital organs: Secondary | ICD-10-CM

## 2017-02-04 LAB — BLADDER SCAN AMB NON-IMAGING: Scan Result: 27

## 2017-02-04 NOTE — Telephone Encounter (Signed)
Medication called into [facility]. 

## 2017-02-04 NOTE — Telephone Encounter (Signed)
Would you call in a prescription for Trimix for this patient to [facility] [place]?

## 2017-02-05 ENCOUNTER — Telehealth: Payer: Self-pay

## 2017-02-05 DIAGNOSIS — Z794 Long term (current) use of insulin: Secondary | ICD-10-CM | POA: Diagnosis not present

## 2017-02-05 DIAGNOSIS — E1165 Type 2 diabetes mellitus with hyperglycemia: Secondary | ICD-10-CM | POA: Diagnosis not present

## 2017-02-05 LAB — PSA: Prostate Specific Ag, Serum: 3.7 ng/mL (ref 0.0–4.0)

## 2017-02-05 NOTE — Telephone Encounter (Signed)
-----   Message from Nori Riis, PA-C sent at 02/05/2017  8:44 AM EDT ----- PSA has reduced.   We will see him in 6 months.

## 2017-02-05 NOTE — Telephone Encounter (Signed)
[facility]- labs are good.

## 2017-03-06 ENCOUNTER — Ambulatory Visit: Payer: 59

## 2017-03-07 ENCOUNTER — Ambulatory Visit (INDEPENDENT_AMBULATORY_CARE_PROVIDER_SITE_OTHER): Payer: 59 | Admitting: Family Medicine

## 2017-03-07 ENCOUNTER — Encounter: Payer: Self-pay | Admitting: Family Medicine

## 2017-03-07 VITALS — BP 158/99 | HR 85 | Temp 97.8°F | Resp 20 | Ht 68.9 in | Wt 280.6 lb

## 2017-03-07 DIAGNOSIS — S0502XA Injury of conjunctiva and corneal abrasion without foreign body, left eye, initial encounter: Secondary | ICD-10-CM | POA: Diagnosis not present

## 2017-03-07 MED ORDER — POLYMYXIN B-TRIMETHOPRIM 10000-0.1 UNIT/ML-% OP SOLN: 1.0000 [drp] | OPHTHALMIC | 0 refills | Status: DC

## 2017-03-07 MED ORDER — POLYMYXIN B-TRIMETHOPRIM 10000-0.1 UNIT/ML-% OP SOLN: 1.0000 [drp] | OPHTHALMIC | 0 refills | Status: AC

## 2017-03-07 NOTE — Patient Instructions (Addendum)
     IF you received an x-ray today, you will receive an invoice from [facility]. Please contact [facility] at [phone] with questions or concerns regarding your invoice.   IF you received labwork today, you will receive an invoice from [facility]. Please contact [facility] at [phone] with questions or concerns regarding your invoice.   Our billing staff will not be able to assist you with questions regarding bills from these companies.  You will be contacted with the lab results as soon as they are available. The fastest way to get your results is to activate your My Chart account. Instructions are located on the last page of this paperwork. If you have not heard from us regarding the results in 2 weeks, please contact this office.     Corneal Abrasion A corneal abrasion is a scratch or injury to the clear covering over the front of your eye (cornea). This can be painful. It is important to get treatment for a corneal abrasion. If this problem is not treated, it can affect your eyesight (vision). Follow these instructions at home: Medicines  Use eye drops or ointments as told by your doctor.  If you were prescribed antibiotic drops or ointment, use them as told by your doctor. Do not stop using the antibiotic even if you start to feel better.  Take over-the-counter and prescription medicines only as told by your doctor.  Do not drive or use heavy machinery while taking prescription pain medicine. General instructions  If you have an eye patch, wear it as told by your doctor. ? Do not drive or use machinery while wearing an eye patch. ? Follow instructions from your doctor about when to take off the patch.  Ask your doctor if you can use a cold, wet cloth (compress) on your eye to help with pain.  Do not rub or touch your eye. Do not wash out your eye.  Do not wear contact lenses until your doctor says that this is okay.  Avoid bright light.  Avoid  straining your eyes.  Keep all follow-up visits as told by your doctor. Doing this can help to prevent infection and loss of eyesight. Contact a doctor if:  You continue to have eye pain and other symptoms for more than 2 days.  You get new symptoms, such as: ? Redness. ? Watery eyes (tearing). ? Discharge.  You have discharge that makes your eyelids stick together in the morning.  Symptoms come back after your eye heals. Get help right away if:  You have very bad eye pain that does not get better with medicine.  You lose eyesight. Summary  A corneal abrasion is a scratch or injury to the clear covering over the front of your eye (cornea).  It is important to get treatment for a corneal abrasion. If this problem is not treated, it can affect your eyesight (vision).  Use eye drops or ointments as told by your doctor.  If you have an eye patch, do not drive or use machinery while wearing it. This information is not intended to replace advice given to you by your health care provider. Make sure you discuss any questions you have with your health care provider. Document Released: 04/15/2008 Document Revised: 10/12/2016 Document Reviewed: 10/12/2016 Elsevier Interactive Patient Education  2017 Elsevier Inc.  

## 2017-03-07 NOTE — Progress Notes (Signed)
Chief Complaint  Patient presents with  . Eye Problem    X 3 days- with pain and burning    HPI   Patient reports that 3 days ago he was rubbing his left eye  Since that time he reports that the lower eye lid is inflammed, edematous, with increased lacrimation No change in eye color He reports that he used visine which did not help He reports that his vision is blurry in the left eye even with his glasses His eye feels scratchy  Past Medical History:  Diagnosis Date  . Acute laryngitis, without mention of obstruction   . Allergic rhinitis, cause unspecified   . Chalazion   . Cough   . Dermatophytosis of foot   . Diabetes mellitus without complication ([place])   . Elevated PSA   . Hyperlipidemia   . Hypertension   . Microalbuminuria   . Overweight   . Seborrheic dermatitis, unspecified   . Sleep apnea   . Unspecified disorder of autonomic nervous system   . Unspecified sleep apnea   . Unspecified venous (peripheral) insufficiency   . Unspecified vitamin D deficiency     Current Outpatient Prescriptions  Medication Sig Dispense Refill  . aspirin EC 81 MG tablet Take 81 mg by mouth every evening.     Marland Kitchen atorvastatin (LIPITOR) 40 MG tablet Take 40 mg by mouth every evening.    Marland Kitchen CIALIS 20 MG tablet TAKE ONE TABLET BY MOUTH AS NEEDED FOR ERECTILE DYSFUNCTION 10 tablet 1  . Continuous Blood Gluc Receiver (FREESTYLE LIBRE READER) DEVI Use 1 Units as directed.    . Dulaglutide (TRULICITY) 1.5 EZ/6.6QH SOPN Inject 1.5 mg into the skin every Saturday.     . finasteride (PROSCAR) 5 MG tablet Take 1 tablet (5 mg total) by mouth daily. 90 tablet 4  . furosemide (LASIX) 20 MG tablet Take 20 mg by mouth every evening. Reported on 11/22/2015    . HUMALOG KWIKPEN 100 UNIT/ML KiwkPen INJECT 12 UNITS SUBCUTANEOUSLY DAILY AT 7 AM.  12  . Insulin Glargine (LANTUS) 100 UNIT/ML Solostar Pen Inject 45 Units into the skin daily at 10 pm.    . lisinopril (PRINIVIL,ZESTRIL) 5 MG tablet Take 5 mg by  mouth every evening.     . metFORMIN (GLUCOPHAGE) 1000 MG tablet TAKE 1 TABLET (1,000 MG TOTAL) BY MOUTH 2 (TWO) TIMES DAILY WITH MEALS.  6  . Multiple Vitamin (MULTI-VITAMINS) TABS Take by mouth.    . Multiple Vitamins-Minerals (MULTIVITAMIN PO) Take 1 tablet by mouth every evening.     . sildenafil (REVATIO) 20 MG tablet Take 3 to 5 tablets two hours before intercouse on an empty stomach.  Do not take with nitrates. 50 tablet 3  . docusate sodium (COLACE) 100 MG capsule Take 1 capsule (100 mg total) by mouth 2 (two) times daily. (Patient not taking: Reported on 03/07/2017) 60 capsule 0  . trimethoprim-polymyxin b (POLYTRIM) ophthalmic solution Place 1 drop into the left eye every 4 (four) hours. For 3 days 10 mL 0   No current facility-administered medications for this visit.     Allergies:  Allergies  Allergen Reactions  . Other Anaphylaxis    Mushrooms    Past Surgical History:  Procedure Laterality Date  . CIRCUMCISION    . HYDROCELE EXCISION Right 09/23/2016   Procedure: HYDROCELECTOMY ADULT ;  Surgeon: Hollice Espy, MD;  Location: [facility] ORS;  Service: Urology;  Laterality: Right;  INGUINAL APPROACH  . ROTATOR CUFF REPAIR  R 2011, L  2006   L and R shoulder  . TONSILLECTOMY AND ADENOIDECTOMY      Social History   Social History  . Marital status: Single    Spouse name: n/a  . Number of children: 2  . Years of education: 27   Occupational History  . 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 Smoking status: Former Research scientist (life sciences)  . Smokeless tobacco: Never Used     Comment: quit over 40 years ago  . Alcohol use 1.2 oz/week    2 Cans of beer per week     Comment: paranoia about alcholism; alcohol makes me sleepy.  . Drug use: No  . Sexual activity: Yes    Partners: Female    Birth control/ protection: Condom   Other Topics Concern  . None   Social History Narrative   Lives alone. Adult son is in the [employer].  His daughter lives with her  mother (his ex-wife).    Separated; after 6 years of marriage. Not dating.   Always uses seat belts.    Smoke alarm in the home.    No guns in the home.    Exercise: Moderate 3 x week, 20-30 minutes,walking, member of the [facility].   Caffeine use: Moderate.    ROS See hpi  Objective: Vitals:   03/07/17 1226  BP: (!) 158/99  Pulse: 85  Resp: 20  Temp: 97.8 F (36.6 C)  TempSrc: Oral  SpO2: 96%  Weight: 280 lb 9.6 oz (127.3 kg)  Height: 5' 8.9" (1.75 m)    Physical Exam  Constitutional: He appears well-developed and well-nourished.  HENT:  Head: Normocephalic and atraumatic.  Eyes: Conjunctivae and EOM are normal. Pupils are equal, round, and reactive to light.  Fundoscopic exam without papilledema, av nicking    Assessment and Plan Arash was seen today for eye problem.  Diagnoses and all orders for this visit:  Abrasion of left cornea, initial encounter- visual acuity 20/40 both eyes Based on history and exam will treat with polytrim eye drops Return to clinic if there is purulent discharge -     trimethoprim-polymyxin b (POLYTRIM) ophthalmic solution; Place 1 drop into the left eye every 4 (four) hours. For 3 days     Zoe A Stallings

## 2017-03-14 DIAGNOSIS — E1165 Type 2 diabetes mellitus with hyperglycemia: Secondary | ICD-10-CM | POA: Diagnosis not present

## 2017-03-14 DIAGNOSIS — I10 Essential (primary) hypertension: Secondary | ICD-10-CM | POA: Diagnosis not present

## 2017-03-14 DIAGNOSIS — Z79899 Other long term (current) drug therapy: Secondary | ICD-10-CM | POA: Diagnosis not present

## 2017-03-19 ENCOUNTER — Ambulatory Visit: Payer: 59 | Admitting: Urology

## 2017-03-25 ENCOUNTER — Ambulatory Visit (INDEPENDENT_AMBULATORY_CARE_PROVIDER_SITE_OTHER): Payer: 59 | Admitting: Family Medicine

## 2017-03-25 ENCOUNTER — Encounter: Payer: Self-pay | Admitting: Family Medicine

## 2017-03-25 VITALS — BP 152/73 | HR 53 | Temp 98.4°F | Resp 17 | Ht 69.5 in | Wt 280.0 lb

## 2017-03-25 DIAGNOSIS — H04203 Unspecified epiphora, bilateral lacrimal glands: Secondary | ICD-10-CM

## 2017-03-25 DIAGNOSIS — I10 Essential (primary) hypertension: Secondary | ICD-10-CM | POA: Diagnosis not present

## 2017-03-25 DIAGNOSIS — H547 Unspecified visual loss: Secondary | ICD-10-CM

## 2017-03-25 DIAGNOSIS — S0502XD Injury of conjunctiva and corneal abrasion without foreign body, left eye, subsequent encounter: Secondary | ICD-10-CM

## 2017-03-25 MED ORDER — OLOPATADINE HCL 0.2 % OP SOLN: OPHTHALMIC | 0 refills | Status: DC

## 2017-03-25 NOTE — Patient Instructions (Addendum)
Follow up with Dr. Katy Fitch at 3:15pm    IF you received an x-ray today, you will receive an invoice from [facility]. Please contact [facility] at [phone] with questions or concerns regarding your invoice.   IF you received labwork today, you will receive an invoice from [facility]. Please contact [facility] at [phone] with questions or concerns regarding your invoice.   Our billing staff will not be able to assist you with questions regarding bills from these companies.  You will be contacted with the lab results as soon as they are available. The fastest way to get your results is to activate your My Chart account. Instructions are located on the last page of this paperwork. If you have not heard from [place] regarding the results in 2 weeks, please contact this office.      How to Take Your Blood Pressure Blood pressure is a measurement of how strongly your blood is pressing against the walls of your arteries. Arteries are blood vessels that carry blood from your heart throughout your body. Your health care provider takes your blood pressure at each office visit. You can also take your own blood pressure at home with a blood pressure machine. You may need to take your own blood pressure:  To confirm a diagnosis of high blood pressure (hypertension).  To monitor your blood pressure over time.  To make sure your blood pressure medicine is working. Supplies needed: To take your blood pressure, you will need a blood pressure machine. You can buy a blood pressure machine, or blood pressure monitor, at most drugstores or online. There are several types of home blood pressure monitors. When choosing one, consider the following:  Choose a monitor that has an arm cuff.  Choose a monitor that wraps snugly around your upper arm. You should be able to fit only one finger between your arm and the cuff.  Do not choose a monitor that measures your blood pressure from your wrist or  finger. Your health care provider can suggest a reliable monitor that will meet your needs. How to prepare To get the most accurate reading, avoid the following for 30 minutes before you check your blood pressure:  Drinking caffeine.  Drinking alcohol.  Eating.  Smoking.  Exercising. Five minutes before you check your blood pressure:  Empty your bladder.  Sit quietly without talking in a dining chair, rather than in a soft couch or armchair. How to take your blood pressure To check your blood pressure, follow the instructions in the manual that came with your blood pressure monitor. If you have a digital blood pressure monitor, the instructions may be as follows: 1. Sit up straight. 2. Place your feet on the floor. Do not cross your ankles or legs. 3. Rest your left arm at the level of your heart on a table or desk or on the arm of a chair. 4. Pull up your shirt sleeve. 5. Wrap the blood pressure cuff around the upper part of your left arm, 1 inch (2.5 cm) above your elbow. It is best to wrap the cuff around bare skin. 6. Fit the cuff snugly around your arm. You should be able to place only one finger between the cuff and your arm. 7. Position the cord inside the groove of your elbow. 8. Press the power button. 9. Sit quietly while the cuff inflates and deflates. 10. Read the digital reading on the monitor screen and write it down (record it). 11. Wait 2-3 minutes, then  repeat the steps, starting at step 1. What does my blood pressure reading mean? A blood pressure reading consists of a higher number over a lower number. Ideally, your blood pressure should be below 120/80. The first ("top") number is called the systolic pressure. It is a measure of the pressure in your arteries as your heart beats. The second ("bottom") number is called the diastolic pressure. It is a measure of the pressure in your arteries as the heart relaxes. Blood pressure is classified into four stages. The  following are the stages for adults who do not have a short-term serious illness or a chronic condition. Systolic pressure and diastolic pressure are measured in a unit called mm Hg. Normal   Systolic pressure: below 818.  Diastolic pressure: below 80. Elevated   Systolic pressure: 299-371.  Diastolic pressure: below 80. Hypertension stage 1   Systolic pressure: 696-789.  Diastolic pressure: 38-10. Hypertension stage 2   Systolic pressure: 175 or above.  Diastolic pressure: 90 or above. You can have prehypertension or hypertension even if only the systolic or only the diastolic number in your reading is higher than normal. Follow these instructions at home:  Check your blood pressure as often as recommended by your health care provider.  Take your monitor to the next appointment with your health care provider to make sure:  That you are using it correctly.  That it provides accurate readings.  Be sure you understand what your goal blood pressure numbers are.  Tell your health care provider if you are having any side effects from blood pressure medicine. Contact a health care provider if:  Your blood pressure is consistently high. Get help right away if:  Your systolic blood pressure is higher than 180.  Your diastolic blood pressure is higher than 110. This information is not intended to replace advice given to you by your health care provider. Make sure you discuss any questions you have with your health care provider. Document Released: 04/05/2016 Document Revised: 06/18/2016 Document Reviewed: 04/05/2016 Elsevier Interactive Patient Education  2017 [employer].

## 2017-03-25 NOTE — Progress Notes (Signed)
Chief Complaint  Patient presents with  . Facial Swelling    HPI   Vision Problems Pt reports that his right eye is now watery and has crusty drainage that is white from the right eye  He states that at times it is like a film over his eyes He denies vision changes He states that he goes to Dr. Katy Fitch for eye exams He currently has upper lid swelling on the right with some discomfort with reading. 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 He denies a history of allergic rhinitis   Hypertension: While here for his eye exam he was noted to have elevated blood pressure. He reports that he took all his usual lisinopril 5mg  and furosemide He states that he has external stressors such as family discord and a lack of an air condition he feels more stressed. He states that he knows he can get his blood pressure back on track.  He is a nonsmoker BP Readings from Last 3 Encounters:  03/25/17 (!) 152/73  03/07/17 (!) 158/99  02/04/17 124/80    Past Medical History:  Diagnosis Date  . Acute laryngitis, without mention of obstruction   . Allergic rhinitis, cause unspecified   . Chalazion   . Cough   . Dermatophytosis of foot   . Diabetes mellitus without complication ([place])   . Elevated PSA   . Hyperlipidemia   . Hypertension   . Microalbuminuria   . Overweight   . Seborrheic dermatitis, unspecified   . Sleep apnea   . Unspecified disorder of autonomic nervous system   . Unspecified sleep apnea   . Unspecified venous (peripheral) insufficiency   . Unspecified vitamin D deficiency     Current Outpatient Prescriptions  Medication Sig Dispense Refill  . aspirin EC 81 MG tablet Take 81 mg by mouth every evening.     Marland Kitchen atorvastatin (LIPITOR) 40 MG tablet Take 40 mg by mouth every evening.    Marland Kitchen CIALIS 20 MG tablet TAKE ONE TABLET BY MOUTH AS NEEDED FOR ERECTILE  DYSFUNCTION 10 tablet 1  . Continuous Blood Gluc Receiver (FREESTYLE LIBRE READER) DEVI Use 1 Units as directed.    . docusate sodium (COLACE) 100 MG capsule Take 1 capsule (100 mg total) by mouth 2 (two) times daily. 60 capsule 0  . Dulaglutide (TRULICITY) 1.5 WE/9.9BZ SOPN Inject 1.5 mg into the skin every Saturday.     . finasteride (PROSCAR) 5 MG tablet Take 1 tablet (5 mg total) by mouth daily. 90 tablet 4  . furosemide (LASIX) 20 MG tablet Take 20 mg by mouth every evening. Reported on 11/22/2015    . HUMALOG KWIKPEN 100 UNIT/ML KiwkPen INJECT 12 UNITS SUBCUTANEOUSLY DAILY AT 7 AM.  12  . Insulin Glargine (LANTUS) 100 UNIT/ML Solostar Pen Inject 45 Units into the skin daily at 10 pm.    . lisinopril (PRINIVIL,ZESTRIL) 5 MG tablet Take 5 mg by mouth every evening.     . metFORMIN (GLUCOPHAGE) 1000 MG tablet TAKE 1 TABLET (1,000 MG TOTAL) BY MOUTH 2 (TWO) TIMES DAILY WITH MEALS.  6  . Multiple Vitamin (MULTI-VITAMINS) TABS Take by mouth.    . Multiple Vitamins-Minerals (MULTIVITAMIN PO) Take 1 tablet by mouth every evening.     . sildenafil (REVATIO) 20 MG tablet Take 3 to 5 tablets two hours before intercouse on an empty stomach.  Do not take with nitrates. 50 tablet 3  . Olopatadine HCl 0.2 % SOLN Use once daily to both eyes 2.5 mL 0   No current facility-administered medications for this visit.     Allergies:  Allergies  Allergen Reactions  . Other Anaphylaxis    Mushrooms    Past Surgical History:  Procedure Laterality Date  . CIRCUMCISION    . HYDROCELE EXCISION Right 09/23/2016   Procedure: HYDROCELECTOMY ADULT ;  Surgeon: Hollice Espy, MD;  Location: [facility] ORS;  Service: Urology;  Laterality: Right;  INGUINAL APPROACH  . ROTATOR CUFF REPAIR  R 2011, L 2006   L and R shoulder  . TONSILLECTOMY AND ADENOIDECTOMY      Social History   Social History  . Marital status: Single    Spouse name: n/a  . Number of children: 2  . Years of education: 8   Occupational History   . 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 Smoking status: Former Research scientist (life sciences)  . Smokeless tobacco: Never Used     Comment: quit over 40 years ago  . Alcohol use 1.2 oz/week    2 Cans of beer per week     Comment: paranoia about alcholism; alcohol makes me sleepy.  . Drug use: No  . Sexual activity: Yes    Partners: Female    Birth control/ protection: Condom   Other Topics Concern  . None   Social History Narrative   Lives alone. Adult son is in the [employer].  His daughter lives with her mother (his ex-wife).    Separated; after 6 years of marriage. Not dating.   Always uses seat belts.    Smoke alarm in the home.    No guns in the home.    Exercise: Moderate 3 x week, 20-30 minutes,walking, member of the [facility].   Caffeine use: Moderate.    ROS See hpi  Objective: Vitals:   03/25/17 1229 03/25/17 1256 03/25/17 1257  BP: (!) 154/94 (!) 139/101 (!) 152/73  Pulse: 88 (!) 53   Resp: 17    Temp: 98.4 F (36.9 C)    TempSrc: Oral    SpO2: 98%    Weight: 280 lb (127 kg)    Height: 5' 9.5" (1.765 m)      Physical Exam  Constitutional: He is oriented to person, place, and time. He appears well-developed and well-nourished.  HENT:  Head: Normocephalic and atraumatic.  Eyes: Conjunctivae are normal. Right eye exhibits discharge and exudate. Right eye exhibits no hordeolum. No foreign body present in the right eye. Left eye exhibits no discharge, no exudate and no hordeolum. No foreign body present in the left eye. Right conjunctiva is not injected. Right conjunctiva has no hemorrhage. Left conjunctiva is not injected. Left conjunctiva has no hemorrhage. No scleral icterus. Right eye exhibits normal extraocular motion. Left eye exhibits normal extraocular motion.  Cardiovascular: Normal rate, regular rhythm and normal heart sounds.   Pulmonary/Chest: Effort normal and breath sounds normal. No respiratory distress. He has no wheezes.  Neurological: He is  alert and oriented to person, place, and time.  Psychiatric: He has a normal mood and affect. His behavior is normal. Judgment and thought content normal.  Due to dark pigment of patient's pupil it was difficult to do an in office dilated eye exam. PERRLA   Assessment and Plan Johnny Maddox was seen today for facial swelling.  Diagnoses and all orders for this visit:  Watery  eyes- gave olopatadine eye drops for symptomatic relief  Abrasion of left cornea, subsequent encounter- advised to follow up with eye doctor, symptoms have improved over the past 2 weeks  Decreased visual acuity- minor decrease from right eye 20/30 to 20/40 Advised follow up for dilated eye exam with Dr. Katy Fitch  Uncontrolled hypertension- discussed that he should get a home bp monitor and if bp remains elevated then should increase lisinopril to 10mg  and follow up with Dr. Tamala Julian Plan for follow up with Dr. Tamala Julian in one month for bp check  Other orders -     Olopatadine HCl 0.2 % SOLN; Use once daily to both eyes     [place]

## 2017-03-26 DIAGNOSIS — H00021 Hordeolum internum right upper eyelid: Secondary | ICD-10-CM | POA: Diagnosis not present

## 2017-03-26 DIAGNOSIS — H04123 Dry eye syndrome of bilateral lacrimal glands: Secondary | ICD-10-CM | POA: Diagnosis not present

## 2017-03-27 ENCOUNTER — Telehealth: Payer: Self-pay

## 2017-03-27 NOTE — Telephone Encounter (Signed)
Need clarification on eye drops, how many drops

## 2017-03-28 NOTE — Telephone Encounter (Signed)
Saw eye doctor and was told to use warm compress and if no improvement then surgery.

## 2017-04-07 NOTE — Progress Notes (Signed)
04/08/2017 6:49 PM   Johnny Maddox 1956/08/21 [id number]  Referring provider: Wardell Honour, MD [address] [place], [place] [address]  Chief Complaint  Patient presents with  . Erectile Dysfunction    TriMix injection    HPI: Patient is a 61 year old African-American male with an elevated PSA, BPH with LUTS, erectile dysfunction and s/p hydrocele on 09/23/2016 who presents today for a Trimix titration.    Erectile dysfunction His SHIM score was 6, which is severe erectile dysfunction.   His previous SHIM score was 7.  He has been having difficulty with erections for over 4 years .   His major complaint is maintaining an erection for the completion of satisfactory intercourse.  His libido is preserved.   His risk factors for ED are DM, age, BPH, HTN and vascular disease.  He denies any painful erections or curvatures with his erections.   He has tried PDE5-inhibitors in the past and found them either hit or miss and efficacy.  He is still having spontaneous erections.     PMH: Past Medical History:  Diagnosis Date  . Acute laryngitis, without mention of obstruction   . Allergic rhinitis, cause unspecified   . Chalazion   . Cough   . Dermatophytosis of foot   . Diabetes mellitus without complication ([place])   . Elevated PSA   . Hyperlipidemia   . Hypertension   . Microalbuminuria   . Overweight   . Seborrheic dermatitis, unspecified   . Sleep apnea   . Unspecified disorder of autonomic nervous system   . Unspecified sleep apnea   . Unspecified venous (peripheral) insufficiency   . Unspecified vitamin D deficiency     Surgical History: Past Surgical History:  Procedure Laterality Date  . CIRCUMCISION    . HYDROCELE EXCISION Right 09/23/2016   Procedure: HYDROCELECTOMY ADULT ;  Surgeon: Hollice Espy, MD;  Location: [facility] ORS;  Service: Urology;  Laterality: Right;  INGUINAL APPROACH  . ROTATOR CUFF REPAIR  R 2011, L 2006   L and R shoulder  . TONSILLECTOMY AND  ADENOIDECTOMY      Home Medications:  Allergies as of 04/08/2017      Reactions   Other Anaphylaxis   Mushrooms      Medication List       Accurate as of 04/08/17 11:59 PM. Always use your most recent med list.          aspirin EC 81 MG tablet Take 81 mg by mouth every evening.   atorvastatin 40 MG tablet Commonly known as:  LIPITOR Take 40 mg by mouth every evening.   CIALIS 20 MG tablet Generic drug:  tadalafil TAKE ONE TABLET BY MOUTH AS NEEDED FOR ERECTILE DYSFUNCTION   docusate sodium 100 MG capsule Commonly known as:  COLACE Take 1 capsule (100 mg total) by mouth 2 (two) times daily.   finasteride 5 MG tablet Commonly known as:  PROSCAR Take 1 tablet (5 mg total) by mouth daily.   FREESTYLE LIBRE READER Devi Use 1 Units as directed.   furosemide 20 MG tablet Commonly known as:  LASIX Take 20 mg by mouth every evening. Reported on 11/22/2015   HUMALOG KWIKPEN 100 UNIT/ML KiwkPen Generic drug:  insulin lispro INJECT 12 UNITS SUBCUTANEOUSLY DAILY AT 7 AM.   Insulin Glargine 100 UNIT/ML Solostar Pen Commonly known as:  LANTUS Inject 45 Units into the skin daily at 10 pm.   lisinopril 5 MG tablet Commonly known as:  PRINIVIL,ZESTRIL Take 5  mg by mouth every evening.   metFORMIN 1000 MG tablet Commonly known as:  GLUCOPHAGE TAKE 1 TABLET (1,000 MG TOTAL) BY MOUTH 2 (TWO) TIMES DAILY WITH MEALS.   MULTI-VITAMINS Tabs Take by mouth.   MULTIVITAMIN PO Take 1 tablet by mouth every evening.   sildenafil 20 MG tablet Commonly known as:  REVATIO Take 3 to 5 tablets two hours before intercouse on an empty stomach.  Do not take with nitrates.   TRULICITY 1.5 QI/2.9NL Sopn Generic drug:  Dulaglutide Inject 1.5 mg into the skin every Saturday.       Allergies:  Allergies  Allergen Reactions  . Other Anaphylaxis    Mushrooms    Family History: Family History  Problem Relation Age of Onset  . Cancer Mother        leukemia  . Hypertension Mother    . Alcohol abuse Father   . Hypertension Brother   . Prostate cancer Neg Hx   . Bladder Cancer Neg Hx   . Kidney disease Neg Hx   . Kidney cancer Neg Hx     Social History:  reports that he has quit smoking. He has never used smokeless tobacco. He reports that he drinks about 1.2 oz of alcohol per week . He reports that he does not use drugs.  ROS: UROLOGY Frequent Urination?: No Hard to postpone urination?: No Burning/pain with urination?: No Get up at night to urinate?: No Leakage of urine?: No Urine stream starts and stops?: No Trouble starting stream?: No Do you have to strain to urinate?: No Blood in urine?: No Urinary tract infection?: No Sexually transmitted disease?: No Injury to kidneys or bladder?: No Painful intercourse?: No Weak stream?: No Erection problems?: No Penile pain?: No  Gastrointestinal Nausea?: No Vomiting?: No Indigestion/heartburn?: No Diarrhea?: No Constipation?: No  Constitutional Fever: No Night sweats?: No Weight loss?: No Fatigue?: No  Skin Skin rash/lesions?: No Itching?: No  Eyes Blurred vision?: No Double vision?: No  Ears/Nose/Throat Sore throat?: No Sinus problems?: Yes  Hematologic/Lymphatic Swollen glands?: No Easy bruising?: No  Cardiovascular Leg swelling?: No Chest pain?: No  Respiratory Cough?: Yes Shortness of breath?: No  Endocrine Excessive thirst?: No  Musculoskeletal Back pain?: No Joint pain?: No  Neurological Headaches?: No Dizziness?: No  Psychologic Depression?: No Anxiety?: No  Physical Exam: BP 118/76   Pulse (!) 118   Ht 5\' 10"  (1.778 m)   Wt 265 lb (120.2 kg)   BMI 38.02 kg/m   Constitutional: Well nourished. Alert and oriented, No acute distress. HEENT: [place] AT, moist mucus membranes. Trachea midline, no masses. Cardiovascular: No clubbing, cyanosis, or edema. Respiratory: Normal respiratory effort, no increased work of breathing. GI: Abdomen is soft, non tender, non  distended, no abdominal masses. Liver and spleen not palpable.  No hernias appreciated.  Stool sample for occult testing is not indicated.   GU: No CVA tenderness.  No bladder fullness or masses.  Patient with circumcised phallus.  Urethral meatus is patent.  No penile discharge. No penile lesions or rashes.  Skin: No rashes, bruises or suspicious lesions. Lymph: No cervical or inguinal adenopathy. Neurologic: Grossly intact, no focal deficits, moving all 4 extremities. Psychiatric: Normal mood and affect.  Laboratory Data: Lab Results  Component Value Date   WBC 5.5 12/28/2012   HGB 14.1 12/28/2012   HCT 42.7 12/28/2012   MCV 81.6 12/28/2012   PLT 152 12/28/2012    Lab Results  Component Value Date   CREATININE 0.89 09/09/2016  Lab Results  Component Value Date   HGBA1C 7.8 09/28/2015   PSA History  2.6 ng/mL on 05/08/2015  2.5 ng/mL on 10/11/2015  4.4 ng/mL on 04/03/2016-patient stopped finasteride 6 months ago  4.0 ng/mL on 04/10/2016  6.2 ng/mL on 10/11/2016  3.7 ng/mL on 02/04/2017        Component Value Date/Time   CHOL 183 12/28/2012 1230   HDL 41 12/28/2012 1230   CHOLHDL 4.5 12/28/2012 1230   VLDL 26 12/28/2012 1230   LDLCALC 116 (H) 12/28/2012 1230    Lab Results  Component Value Date   AST 21 12/28/2012   Lab Results  Component Value Date   ALT 29 12/28/2012   Procedure  Patient's left corpus cavernosum is identified.  An area near the base of the penis is cleansed with rubbing alcohol.  Careful to avoid the dorsal vein, 1 mcg of Trimix ( Papaverine 30 mg, Phentolamine 1 mg, Prostaglandin 10 mcg Lot # [id number] , exp. 03/21/2017) is injected at a 90 degree angle into the left corpus cavernosum near the base of the penis.  Patient experienced an erection in 15 minutes.  The erection was not firm enough for penetration so the decision was made to inject another 1 mcg into the penis.  The patient then received an adequate erection that was firm enough  for penetration.    Assessment & Plan:    1. Erectile dysfunction  - SHIM score is 6  - Patient will call back this afternoon to report his results and experience after the Trimix injection  - Advised patient of the condition of priapism, painful erection lasting for more than four hours, and to contact the office immediately or seek treatment in the ED  2. History of elevated PSA  - Current PSA is 3.7 ng/mL on 02/04/2017  - (Not addressed at this visit)  3. BPH with LUTS  - (Not addressed at this visit)  4. Right hydrocele  - (Not addressed at this visit)  5. Testicular mass  - 5 mm left hypoechoic mass within the testicle, so far stable - etiology of this is unclear. Tumor markers were negative  - RTC in 06/04/2017 for repeat scrotal ultrasound and office visit with Dr. 06/17/2017   Return for keep follow up with Dr. Erlene Quan.  These notes generated with voice recognition software. I apologize for typographical errors.  Erlene Quan, [facility] [address] [place], [place] [phone]

## 2017-04-08 ENCOUNTER — Ambulatory Visit: Payer: 59 | Admitting: Urology

## 2017-04-08 ENCOUNTER — Encounter: Payer: Self-pay | Admitting: Urology

## 2017-04-08 VITALS — BP 118/76 | HR 118 | Ht 70.0 in | Wt 265.0 lb

## 2017-04-08 DIAGNOSIS — N529 Male erectile dysfunction, unspecified: Secondary | ICD-10-CM | POA: Diagnosis not present

## 2017-04-08 DIAGNOSIS — N509 Disorder of male genital organs, unspecified: Secondary | ICD-10-CM | POA: Diagnosis not present

## 2017-04-08 DIAGNOSIS — N5089 Other specified disorders of the male genital organs: Secondary | ICD-10-CM

## 2017-04-10 ENCOUNTER — Telehealth: Payer: Self-pay | Admitting: Urology

## 2017-04-10 NOTE — Telephone Encounter (Signed)
Would you call Mr. Fickle and ask him how his Trimix injection went on Tuesday?

## 2017-04-11 NOTE — Telephone Encounter (Signed)
Patient c/b stating Rx not at [facility]. Advised would call in Rx. Patient verbalized understanding. Called in Trimex Injection- 10 prefilled syringes @ .25mcg to Customer Care.

## 2017-04-11 NOTE — Telephone Encounter (Signed)
Pt states injection went well. He will contact [facility] to find out if his pre-filled injections are ready for pick up.

## 2017-04-12 ENCOUNTER — Ambulatory Visit: Payer: 59 | Admitting: Family Medicine

## 2017-04-12 DIAGNOSIS — J209 Acute bronchitis, unspecified: Secondary | ICD-10-CM | POA: Diagnosis not present

## 2017-04-13 ENCOUNTER — Other Ambulatory Visit: Payer: Self-pay | Admitting: Urology

## 2017-04-25 DIAGNOSIS — E119 Type 2 diabetes mellitus without complications: Secondary | ICD-10-CM | POA: Diagnosis not present

## 2017-04-25 DIAGNOSIS — R05 Cough: Secondary | ICD-10-CM | POA: Diagnosis not present

## 2017-05-09 ENCOUNTER — Ambulatory Visit: Payer: 59

## 2017-05-12 ENCOUNTER — Ambulatory Visit
Admission: RE | Admit: 2017-05-12 | Discharge: 2017-05-12 | Disposition: A | Discharge status: Home / Self Care | Payer: 59 | Source: Ambulatory Visit | Attending: Urology | Admitting: Urology

## 2017-05-12 DIAGNOSIS — N509 Disorder of male genital organs, unspecified: Secondary | ICD-10-CM | POA: Diagnosis not present

## 2017-05-12 DIAGNOSIS — N492 Inflammatory disorders of scrotum: Secondary | ICD-10-CM | POA: Insufficient documentation

## 2017-05-12 DIAGNOSIS — N5089 Other specified disorders of the male genital organs: Secondary | ICD-10-CM

## 2017-05-12 DIAGNOSIS — N433 Hydrocele, unspecified: Secondary | ICD-10-CM | POA: Insufficient documentation

## 2017-05-23 ENCOUNTER — Encounter: Payer: Self-pay | Admitting: Family Medicine

## 2017-05-23 ENCOUNTER — Ambulatory Visit (INDEPENDENT_AMBULATORY_CARE_PROVIDER_SITE_OTHER): Payer: 59 | Admitting: Family Medicine

## 2017-05-23 VITALS — BP 126/81 | HR 99 | Temp 97.6°F | Resp 18 | Ht 69.33 in | Wt 284.4 lb

## 2017-05-23 DIAGNOSIS — E119 Type 2 diabetes mellitus without complications: Secondary | ICD-10-CM | POA: Diagnosis not present

## 2017-05-23 DIAGNOSIS — Z113 Encounter for screening for infections with a predominantly sexual mode of transmission: Secondary | ICD-10-CM | POA: Diagnosis not present

## 2017-05-23 DIAGNOSIS — Z23 Encounter for immunization: Secondary | ICD-10-CM

## 2017-05-23 DIAGNOSIS — Z0000 Encounter for general adult medical examination without abnormal findings: Secondary | ICD-10-CM | POA: Diagnosis not present

## 2017-05-23 DIAGNOSIS — E559 Vitamin D deficiency, unspecified: Secondary | ICD-10-CM

## 2017-05-23 DIAGNOSIS — Z125 Encounter for screening for malignant neoplasm of prostate: Secondary | ICD-10-CM | POA: Diagnosis not present

## 2017-05-23 DIAGNOSIS — Z87898 Personal history of other specified conditions: Secondary | ICD-10-CM

## 2017-05-23 DIAGNOSIS — E11620 Type 2 diabetes mellitus with diabetic dermatitis: Secondary | ICD-10-CM | POA: Diagnosis not present

## 2017-05-23 DIAGNOSIS — Z1159 Encounter for screening for other viral diseases: Secondary | ICD-10-CM

## 2017-05-23 DIAGNOSIS — Z794 Long term (current) use of insulin: Secondary | ICD-10-CM

## 2017-05-23 LAB — POCT GLYCOSYLATED HEMOGLOBIN (HGB A1C): Hemoglobin A1C: 8.1

## 2017-05-23 NOTE — Patient Instructions (Addendum)
I would recommend Wellbutrin also known as Buproprion or Zyban to cut down on the snacking or grazing. It is important for diabetics to schedule meals and eat a consistent meal with planned foods so that there is less risk of eating easy to find high calorie, high carbohydrate diet.     IF you received an x-ray today, you will receive an invoice from [facility]. Please contact [facility] at [phone] with questions or concerns regarding your invoice.   IF you received labwork today, you will receive an invoice from [facility]. Please contact [facility] at [phone] with questions or concerns regarding your invoice.   Our billing staff will not be able to assist you with questions regarding bills from these companies.  You will be contacted with the lab results as soon as they are available. The fastest way to get your results is to activate your My Chart account. Instructions are located on the last page of this paperwork. If you have not heard from [place] regarding the results in 2 weeks, please contact this office.     Tips for Eating Away From Home If You Have Diabetes Controlling your level of blood glucose, also known as blood sugar, can be challenging. It can be even more difficult when you do not prepare your own meals. The following tips can help you manage your diabetes when you eat away from home. Planning ahead Plan ahead if you know you will be eating away from home:  Ask your health care provider how to time meals and medicine if you are taking insulin.  Make a list of restaurants near you that offer healthy choices. If they have a carry-out menu, take it home and plan what you will order ahead of time.  Look up the restaurant you want to eat at online. Many chain and fast-food restaurants list nutritional information online. Use this information to choose the healthiest options and to calculate how many carbohydrates will be in your meal.  Use a  carbohydrate-counting book or mobile app to look up the carbohydrate content and serving size of the foods you want to eat.  Become familiar with serving sizes and learn to recognize how many servings are in a portion. This will allow you to estimate how many carbohydrates you can eat.  Free foods A "free food" is any food or drink that has less than 5 g of carbohydrates per serving. Free foods include:  Many vegetables.  Hard boiled eggs.  Nuts or seeds.  Olives.  Cheeses.  Meats.  These types of foods make good appetizer choices and are often available at salad bars. Lemon juice, vinegar, or a low-calorie salad dressing of fewer than 20 calories per serving can be used as a "free" salad dressing. Choices to reduce carbohydrates  Substitute nonfat sweetened yogurt with a sugar-free yogurt. Yogurt made from soy milk may also be used, but you will still want a sugar-free or plain option to choose a lower carbohydrate amount.  Ask your server to take away the bread basket or chips from your table.  Order fresh fruit. A salad bar often offers fresh fruit choices. Avoid canned fruit because it is usually packed in sugar or syrup.  Order a salad, and eat it without dressing. Or, create a "free" salad dressing.  Ask for substitutions. For example, instead of [place] fries, request an order of a vegetable such as salad, green beans, or broccoli. Other tips  If you take insulin, take the insulin once your  food arrives to your table. This will ensure your insulin and food are timed correctly.  Ask your server about the portion size before your order, and ask for a take-out box if the portion has more servings than you should have. When your food comes, leave the amount you should have on the plate, and put the rest in the take-out box.  Consider splitting an entree with someone and ordering a side salad. This information is not intended to replace advice given to you by your health care  provider. Make sure you discuss any questions you have with your health care provider. Document Released: 10/28/2005 Document Revised: 04/04/2016 Document Reviewed: 01/25/2014 Elsevier Interactive Patient Education  2018 [employer]. Bupropion tablets (Depression/Mood Disorders) What is this medicine? BUPROPION (byoo PROE pee on) is used to treat depression. This medicine may be used for other purposes; ask your health care provider or pharmacist if you have questions. COMMON BRAND NAME(S): Wellbutrin  What should I watch for while using this medicine? Tell your doctor if your symptoms do not get better or if they get worse. Visit your doctor or health care professional for regular checks on your progress. Because it may take several weeks to see the full effects of this medicine, it is important to continue your treatment as prescribed by your doctor. Patients and their families should watch out for new or worsening thoughts of suicide or depression. Also watch out for sudden changes in feelings such as feeling anxious, agitated, panicky, irritable, hostile, aggressive, impulsive, severely restless, overly excited and hyperactive, or not being able to sleep. If this happens, especially at the beginning of treatment or after a change in dose, call your health care professional. Avoid alcoholic drinks while taking this medicine. Drinking excessive alcoholic beverages, using sleeping or anxiety medicines, or quickly stopping the use of these agents while taking this medicine may increase your risk for a seizure. Do not drive or use heavy machinery until you know how this medicine affects you. This medicine can impair your ability to perform these tasks. Do not take this medicine close to bedtime. It may prevent you from sleeping. Your mouth may get dry. Chewing sugarless gum or sucking hard candy, and drinking plenty of water may help. Contact your doctor if the problem does not go away or is  severe. What side effects may I notice from receiving this medicine? Side effects that you should report to your doctor or health care professional as soon as possible: -allergic reactions like skin rash, itching or hives, swelling of the face, lips, or tongue -breathing problems -changes in vision -confusion -elevated mood, decreased need for sleep, racing thoughts, impulsive behavior -fast or irregular heartbeat -hallucinations, loss of contact with reality -increased blood pressure -redness, blistering, peeling or loosening of the skin, including inside the mouth -seizures -suicidal thoughts or other mood changes -unusually weak or tired -vomiting Side effects that usually do not require medical attention (report to your doctor or health care professional if they continue or are bothersome): -constipation -headache -loss of appetite -nausea -tremors -weight loss This list may not describe all possible side effects. Call your doctor for medical advice about side effects. You may report side effects to FDA at 1-800-FDA-1088. Where should I keep my medicine? Keep out of the reach of children. Store at room temperature between 20 and 25 degrees C (68 and 77 degrees F), away from direct sunlight and moisture. Keep tightly closed. Throw away any unused medicine after the expiration date.  NOTE: This sheet is a summary. It may not cover all possible information. If you have questions about this medicine, talk to your doctor, pharmacist, or health care provider.  2018 Elsevier/Gold Standard (2016-04-19 13:44:21)

## 2017-05-23 NOTE — Progress Notes (Signed)
Chief Complaint  Patient presents with  . Annual Exam    STD blood draw also    Subjective:  Johnny Maddox is a 61 y.o. male here for a health maintenance visit.  Patient is established pt   Pt reports that his Endocrinologist at the [facility]  He has had uncontrolled Diabetes since 12/2015  He hopes to lose weight by Christmas so that he can improve his diabetes He reports that he is concerned about the side effects of bariatric surgery   Wt Readings from Last 3 Encounters:  05/23/17 284 lb 6.4 oz (129 kg)  04/08/17 265 lb (120.2 kg)  03/25/17 280 lb (127 kg)   Body mass index is 41.6 kg/m.  Lab Results  Component Value Date   HGBA1C 8.1 05/23/2017     Patient Active Problem List   Diagnosis Date Noted  . Morbid obesity with BMI of 40.0-44.9, adult ([place]) 08/06/2016  . 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  . Follow-up circumcision 12/11/2015  . Balanitis 10/11/2015  . 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  . Avitaminosis D 09/13/2015  . History of elevated PSA 06/06/2015  . Hydrocele sac 05/08/2015  . Impotence 05/08/2015  . BPH with obstruction/lower urinary tract symptoms 05/08/2015  . ED (erectile dysfunction) of organic origin 07/12/2014  . Microalbuminuria 07/10/2014  . Dyslipidemia 06/02/2014  . Diabetes mellitus, type 2 ([place]) 06/02/2014  . Type 2 diabetes mellitus ([place]) 06/02/2014  . Cough syncope 06/08/2013  . Diabetes mellitus ([place]) 10/24/2012  . Chronic venous insufficiency 03/13/2008  . Apnea, sleep 03/13/2008    Past Medical History:  Diagnosis Date  . Acute laryngitis, without mention of obstruction   . Allergic rhinitis, cause unspecified   . [place]   . Cough   . Dermatophytosis of foot   . Diabetes mellitus without complication ([place])   . Elevated PSA   . Hyperlipidemia   . Hypertension   . Microalbuminuria   . Overweight   . Seborrheic dermatitis, unspecified   . Sleep apnea   . Unspecified disorder of autonomic  nervous system   . Unspecified sleep apnea   . Unspecified venous (peripheral) insufficiency   . Unspecified vitamin D deficiency     Past Surgical History:  Procedure Laterality Date  . CIRCUMCISION    . HYDROCELE EXCISION Right 09/23/2016   Procedure: HYDROCELECTOMY ADULT ;  Surgeon: Hollice Espy, MD;  Location: [facility] ORS;  Service: Urology;  Laterality: Right;  INGUINAL APPROACH  . ROTATOR CUFF REPAIR  R 2011, L 2006   L and R shoulder  . TONSILLECTOMY AND ADENOIDECTOMY       Outpatient Medications Prior to Visit  Medication Sig Dispense Refill  . aspirin EC 81 MG tablet Take 81 mg by mouth every evening.     Marland Kitchen atorvastatin (LIPITOR) 40 MG tablet Take 40 mg by mouth every evening.    Marland Kitchen CIALIS 20 MG tablet TAKE ONE TABLET BY MOUTH AS NEEDED FOR ERECTILE DYSFUNCTION 10 tablet 1  . Continuous Blood Gluc Receiver (FREESTYLE LIBRE READER) DEVI Use 1 Units as directed.    . docusate sodium (COLACE) 100 MG capsule Take 1 capsule (100 mg total) by mouth 2 (two) times daily. (Patient not taking: Reported on 05/23/2017) 60 capsule 0  . Dulaglutide (TRULICITY) 1.5 PY/1.9JK SOPN Inject 1.5 mg into the skin every Saturday.     . finasteride (PROSCAR) 5 MG tablet Take 1 tablet (5 mg total) by mouth daily. 90 tablet 4  . furosemide (LASIX) 20 MG  tablet Take 20 mg by mouth every evening. Reported on 11/22/2015    . HUMALOG KWIKPEN 100 UNIT/ML KiwkPen INJECT 12 UNITS SUBCUTANEOUSLY DAILY AT 7 AM.  12  . Insulin Glargine (LANTUS) 100 UNIT/ML Solostar Pen Inject 45 Units into the skin daily at 10 pm.    . lisinopril (PRINIVIL,ZESTRIL) 5 MG tablet Take 5 mg by mouth every evening.     . metFORMIN (GLUCOPHAGE) 1000 MG tablet TAKE 1 TABLET (1,000 MG TOTAL) BY MOUTH 2 (TWO) TIMES DAILY WITH MEALS.  6  . Multiple Vitamin (MULTI-VITAMINS) TABS Take by mouth.    . Multiple Vitamins-Minerals (MULTIVITAMIN PO) Take 1 tablet by mouth every evening.     . sildenafil (REVATIO) 20 MG tablet Take 3 to 5 tablets  two hours before intercouse on an empty stomach.  Do not take with nitrates. 50 tablet 3  . finasteride (PROSCAR) 5 MG tablet TAKE 1 TABLET (5 MG TOTAL) BY MOUTH ONCE DAILY. 90 tablet 2   No facility-administered medications prior to visit.     Allergies  Allergen Reactions  . Other Anaphylaxis    Mushrooms     Family History  Problem Relation Age of Onset  . Cancer Mother        leukemia  . Hypertension Mother   . Alcohol abuse Father   . Hypertension Brother   . Prostate cancer Neg Hx   . Bladder Cancer Neg Hx   . Kidney disease Neg Hx   . Kidney cancer Neg Hx      Health Habits: Dental Exam: up to date Eye Exam: up to date Exercise: 0 times/week on average Current exercise activities: walking/running Diet: lives alone and sticking to a diabetic diet is difficult  Social History   Social History  . Marital status: Divorced    Spouse name: n/a  . Number of children: 2  . Years of education: 17   Occupational History  . 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 Smoking status: Former Research scientist (life sciences)  . Smokeless tobacco: Never Used     Comment: quit over 40 years ago  . Alcohol use 1.2 oz/week    2 Cans of beer per week     Comment: paranoia about alcholism; alcohol makes me sleepy.  . Drug use: No  . Sexual activity: Yes    Partners: Female    Birth control/ protection: Condom   Other Topics Concern  . Not on file   Social History Narrative   Lives alone. Adult son is in the [employer].  His daughter lives with her mother (his ex-wife).    Separated; after 6 years of marriage. Not dating.   Always uses seat belts.    Smoke alarm in the home.    No guns in the home.    Exercise: Moderate 3 x week, 20-30 minutes,walking, member of the [facility].   Caffeine use: Moderate.   History  Alcohol Use  . 1.2 oz/week  . 2 Cans of beer per week    Comment: paranoia about alcholism; alcohol makes me sleepy.   History  Smoking Status  . Former  Smoker  Smokeless Tobacco  . Never Used    Comment: quit over 40 years ago   History  Drug Use No      Health Maintenance: See under health Maintenance activity for review of completion dates as well. Immunization History  Administered Date(s) Administered  . Influenza Split 08/12/2015  . Pneumococcal Polysaccharide-23  05/23/2017  . Tdap 12/05/2015      Depression Screen-PHQ2/9 Depression screen [facility] 2/9 05/23/2017 03/25/2017 03/07/2017 05/08/2016 05/08/2016  Decreased Interest 0 0 0 0 0  Down, Depressed, Hopeless 0 0 0 0 0  PHQ - 2 Score 0 0 0 0 0       Depression Severity and Treatment Recommendations:  0-4= None  5-9= Mild / Treatment: Support, educate to call if worse; return in one month  10-14= Moderate / Treatment: Support, watchful waiting; Antidepressant or Psycotherapy  15-19= Moderately severe / Treatment: Antidepressant OR Psychotherapy  >= 20 = Major depression, severe / Antidepressant AND Psychotherapy    Review of Systems   Review of Systems  Constitutional: Negative for chills, fever and weight loss.  Respiratory: Negative for cough, shortness of breath and wheezing.   Cardiovascular: Negative for chest pain, palpitations and leg swelling.  Gastrointestinal: Negative for abdominal pain, nausea and vomiting.  Genitourinary: Negative for dysuria, frequency and urgency.  Musculoskeletal: Negative for myalgias.  Skin: Negative for itching and rash.  Neurological: Negative for dizziness, tingling and headaches.  Psychiatric/Behavioral: Negative for depression. The patient is not nervous/anxious.     See HPI for ROS as well.    Objective:   Vitals:   05/23/17 0916  BP: 126/81  Pulse: 99  Resp: 18  Temp: 97.6 F (36.4 C)  TempSrc: Oral  SpO2: 97%  Weight: 284 lb 6.4 oz (129 kg)  Height: 5' 9.33" (1.761 m)    Body mass index is 41.6 kg/m.  Physical Exam  Constitutional: He is oriented to person, place, and time. He appears well-developed  and well-nourished.  HENT:  Head: Normocephalic and atraumatic.  Right Ear: External ear normal.  Left Ear: External ear normal.  Nose: Nose normal.  Mouth/Throat: Oropharynx is clear and moist.  Eyes: Conjunctivae and EOM are normal.  Neck: Normal range of motion. Neck supple.  Cardiovascular: Normal rate, regular rhythm and normal heart sounds.   No murmur heard. Pulmonary/Chest: Effort normal and breath sounds normal. No respiratory distress. He has no wheezes. He has no rales.  Abdominal: Soft. Bowel sounds are normal. He exhibits no distension. There is no tenderness. There is no rebound.  Musculoskeletal: Normal range of motion. He exhibits no edema.  Neurological: He is alert and oriented to person, place, and time. He has normal reflexes.  Skin: Skin is warm. No erythema.  Thickened skin of the ankle and foot with hyperpigmentation  Psychiatric: He has a normal mood and affect. His behavior is normal. Judgment and thought content normal.       Assessment/Plan:   Patient was seen for a health maintenance exam.  Counseled the patient on health maintenance issues. Reviewed her health mainteance schedule and ordered appropriate tests (see orders.) Counseled on regular exercise and weight management. Recommend regular eye exams and dental cleaning.   The following issues were addressed today for health maintenance:   Harout was seen today for annual exam.  Diagnoses and all orders for this visit:  Encounter for health maintenance examination in adult-  Discussed age appropriate screenings  Type 2 diabetes mellitus without complication, without long-term current use of insulin ([place])- uncontrolled diabetes Lab Results  Component Value Date   HGBA1C 8.1 05/23/2017   -     POCT glycosylated hemoglobin (Hb A1C) -     Lipid panel -     Comprehensive metabolic panel  History of elevated PSA-  Will check psa today -     PSA  Screen for STD (sexually transmitted disease)-  discussed condom use Verbal consent given to std testing -     RPR -     HIV antibody -     GC/Chlamydia Probe Amp -     Hepatitis B surface antigen  Morbid obesity ([place])- discussed weight loss, advised nutrition referral, discussed -     Lipid panel -     Comprehensive metabolic panel -     TSH  Need for hepatitis C screening test -     HCV Ab w/Rflx to Verification  Need for pneumococcal vaccination  Avitaminosis D- pt indoors long hours, with history of vit d def Will check This can lead to fatigue -     VITAMIN D 25 Hydroxy (Vit-D Deficiency, Fractures)  Type 2 diabetes mellitus with diabetic dermatitis, with long-term current use of insulin ([place])-  Discussed that his skin changes on his feet are due to his diabetes He should see Cardiology -     Ambulatory referral to Podiatry  Screening for prostate cancer  Other orders -     Pneumococcal polysaccharide vaccine 23-valent greater than or equal to 2yo subcutaneous/IM -     Interpretation:    No Follow-up on file.    Body mass index is 41.6 kg/m.:  Discussed the patient's BMI with patient. The BMI body mass index is 41.6 kg/m.     Future Appointments Date Time Provider [place]  06/04/2017 9:15 AM Hollice Espy, MD [facility] None    Patient Instructions   I would recommend Wellbutrin also known as Buproprion or Zyban to cut down on the snacking or grazing. It is important for diabetics to schedule meals and eat a consistent meal with planned foods so that there is less risk of eating easy to find high calorie, high carbohydrate diet.     IF you received an x-ray today, you will receive an invoice from [facility]. Please contact [facility] at [phone] with questions or concerns regarding your invoice.   IF you received labwork today, you will receive an invoice from [place]. Please contact [facility] at [phone] with questions or concerns regarding your invoice.   Our  billing staff will not be able to assist you with questions regarding bills from these companies.  You will be contacted with the lab results as soon as they are available. The fastest way to get your results is to activate your My Chart account. Instructions are located on the last page of this paperwork. If you have not heard from [place] regarding the results in 2 weeks, please contact this office.     Tips for Eating Away From Home If You Have Diabetes Controlling your level of blood glucose, also known as blood sugar, can be challenging. It can be even more difficult when you do not prepare your own meals. The following tips can help you manage your diabetes when you eat away from home. Planning ahead Plan ahead if you know you will be eating away from home:  Ask your health care provider how to time meals and medicine if you are taking insulin.  Make a list of restaurants near you that offer healthy choices. If they have a carry-out menu, take it home and plan what you will order ahead of time.  Look up the restaurant you want to eat at online. Many chain and fast-food restaurants list nutritional information online. Use this information to choose the healthiest options and to calculate how many carbohydrates will be in  your meal.  Use a carbohydrate-counting book or mobile app to look up the carbohydrate content and serving size of the foods you want to eat.  Become familiar with serving sizes and learn to recognize how many servings are in a portion. This will allow you to estimate how many carbohydrates you can eat.  Free foods A "free food" is any food or drink that has less than 5 g of carbohydrates per serving. Free foods include:  Many vegetables.  Hard boiled eggs.  Nuts or seeds.  Olives.  Cheeses.  Meats.  These types of foods make good appetizer choices and are often available at salad bars. Lemon juice, vinegar, or a low-calorie salad dressing of fewer than 20  calories per serving can be used as a "free" salad dressing. Choices to reduce carbohydrates  Substitute nonfat sweetened yogurt with a sugar-free yogurt. Yogurt made from soy milk may also be used, but you will still want a sugar-free or plain option to choose a lower carbohydrate amount.  Ask your server to take away the bread basket or chips from your table.  Order fresh fruit. A salad bar often offers fresh fruit choices. Avoid canned fruit because it is usually packed in sugar or syrup.  Order a salad, and eat it without dressing. Or, create a "free" salad dressing.  Ask for substitutions. For example, instead of [place] fries, request an order of a vegetable such as salad, green beans, or broccoli. Other tips  If you take insulin, take the insulin once your food arrives to your table. This will ensure your insulin and food are timed correctly.  Ask your server about the portion size before your order, and ask for a take-out box if the portion has more servings than you should have. When your food comes, leave the amount you should have on the plate, and put the rest in the take-out box.  Consider splitting an entree with someone and ordering a side salad. This information is not intended to replace advice given to you by your health care provider. Make sure you discuss any questions you have with your health care provider. Document Released: 10/28/2005 Document Revised: 04/04/2016 Document Reviewed: 01/25/2014 Elsevier Interactive Patient Education  2018 [employer]. Bupropion tablets (Depression/Mood Disorders) What is this medicine? BUPROPION (byoo PROE pee on) is used to treat depression. This medicine may be used for other purposes; ask your health care provider or pharmacist if you have questions. COMMON BRAND NAME(S): Wellbutrin  What should I watch for while using this medicine? Tell your doctor if your symptoms do not get better or if they get worse. Visit your doctor or  health care professional for regular checks on your progress. Because it may take several weeks to see the full effects of this medicine, it is important to continue your treatment as prescribed by your doctor. Patients and their families should watch out for new or worsening thoughts of suicide or depression. Also watch out for sudden changes in feelings such as feeling anxious, agitated, panicky, irritable, hostile, aggressive, impulsive, severely restless, overly excited and hyperactive, or not being able to sleep. If this happens, especially at the beginning of treatment or after a change in dose, call your health care professional. Avoid alcoholic drinks while taking this medicine. Drinking excessive alcoholic beverages, using sleeping or anxiety medicines, or quickly stopping the use of these agents while taking this medicine may increase your risk for a seizure. Do not drive or use heavy machinery until you  know how this medicine affects you. This medicine can impair your ability to perform these tasks. Do not take this medicine close to bedtime. It may prevent you from sleeping. Your mouth may get dry. Chewing sugarless gum or sucking hard candy, and drinking plenty of water may help. Contact your doctor if the problem does not go away or is severe. What side effects may I notice from receiving this medicine? Side effects that you should report to your doctor or health care professional as soon as possible: -allergic reactions like skin rash, itching or hives, swelling of the face, lips, or tongue -breathing problems -changes in vision -confusion -elevated mood, decreased need for sleep, racing thoughts, impulsive behavior -fast or irregular heartbeat -hallucinations, loss of contact with reality -increased blood pressure -redness, blistering, peeling or loosening of the skin, including inside the mouth -seizures -suicidal thoughts or other mood changes -unusually weak or  tired -vomiting Side effects that usually do not require medical attention (report to your doctor or health care professional if they continue or are bothersome): -constipation -headache -loss of appetite -nausea -tremors -weight loss This list may not describe all possible side effects. Call your doctor for medical advice about side effects. You may report side effects to FDA at 1-800-FDA-1088. Where should I keep my medicine? Keep out of the reach of children. Store at room temperature between 20 and 25 degrees C (68 and 77 degrees F), away from direct sunlight and moisture. Keep tightly closed. Throw away any unused medicine after the expiration date. NOTE: This sheet is a summary. It may not cover all possible information. If you have questions about this medicine, talk to your doctor, pharmacist, or health care provider.  2018 Elsevier/Gold Standard (2016-04-19 13:44:21)

## 2017-05-24 LAB — HEPATITIS B SURFACE ANTIGEN: Hepatitis B Surface Ag: NEGATIVE

## 2017-05-24 LAB — PSA: Prostate Specific Ag, Serum: 3.4 ng/mL (ref 0.0–4.0)

## 2017-05-24 LAB — COMPREHENSIVE METABOLIC PANEL
ALT: 35 IU/L (ref 0–44)
AST: 22 IU/L (ref 0–40)
Albumin/Globulin Ratio: 2.1 (ref 1.2–2.2)
Albumin: 4.5 g/dL (ref 3.6–4.8)
Alkaline Phosphatase: 47 IU/L (ref 39–117)
BUN/Creatinine Ratio: 12 (ref 10–24)
BUN: 12 mg/dL (ref 8–27)
Bilirubin Total: 0.3 mg/dL (ref 0.0–1.2)
CO2: 21 mmol/L (ref 20–29)
Calcium: 9.8 mg/dL (ref 8.6–10.2)
Chloride: 105 mmol/L (ref 96–106)
Creatinine, Ser: 1.03 mg/dL (ref 0.76–1.27)
GFR calc Af Amer: 90 mL/min/{1.73_m2} (ref >59)
GFR calc non Af Amer: 78 mL/min/{1.73_m2} (ref >59)
Globulin, Total: 2.1 g/dL (ref 1.5–4.5)
Glucose: 204 mg/dL — ABNORMAL HIGH (ref 65–99)
Potassium: 4.2 mmol/L (ref 3.5–5.2)
Sodium: 140 mmol/L (ref 134–144)
Total Protein: 6.6 g/dL (ref 6.0–8.5)

## 2017-05-24 LAB — HIV ANTIBODY (ROUTINE TESTING W REFLEX): HIV Screen 4th Generation wRfx: NONREACTIVE

## 2017-05-24 LAB — LIPID PANEL
Chol/HDL Ratio: 3.2 ratio (ref 0.0–5.0)
Cholesterol, Total: 177 mg/dL (ref 100–199)
HDL: 55 mg/dL (ref >39)
LDL Calculated: 99 mg/dL (ref 0–99)
Triglycerides: 115 mg/dL (ref 0–149)
VLDL Cholesterol Cal: 23 mg/dL (ref 5–40)

## 2017-05-24 LAB — HCV AB W/RFLX TO VERIFICATION: HCV Ab: <0.1 s/co ratio (ref 0.0–0.9)

## 2017-05-24 LAB — TSH: TSH: 1.55 u[IU]/mL (ref 0.450–4.500)

## 2017-05-24 LAB — VITAMIN D 25 HYDROXY (VIT D DEFICIENCY, FRACTURES): Vit D, 25-Hydroxy: 22 ng/mL — ABNORMAL LOW (ref 30.0–100.0)

## 2017-05-24 LAB — HCV INTERPRETATION

## 2017-05-24 LAB — RPR: RPR Ser Ql: NONREACTIVE

## 2017-05-28 LAB — GC/CHLAMYDIA PROBE AMP
Chlamydia trachomatis, NAA: NEGATIVE
Neisseria gonorrhoeae by PCR: NEGATIVE

## 2017-06-04 ENCOUNTER — Ambulatory Visit: Payer: 59 | Admitting: Urology

## 2017-07-16 ENCOUNTER — Encounter: Payer: Self-pay | Admitting: Urology

## 2017-07-16 ENCOUNTER — Ambulatory Visit: Payer: 59 | Admitting: Urology

## 2017-07-24 ENCOUNTER — Other Ambulatory Visit: Payer: Self-pay | Admitting: Podiatry

## 2017-07-24 ENCOUNTER — Encounter: Payer: Self-pay | Admitting: Podiatry

## 2017-07-24 ENCOUNTER — Ambulatory Visit (INDEPENDENT_AMBULATORY_CARE_PROVIDER_SITE_OTHER): Payer: 59

## 2017-07-24 ENCOUNTER — Ambulatory Visit (INDEPENDENT_AMBULATORY_CARE_PROVIDER_SITE_OTHER): Payer: 59 | Admitting: Podiatry

## 2017-07-24 VITALS — BP 156/95 | HR 89

## 2017-07-24 DIAGNOSIS — M205X9 Other deformities of toe(s) (acquired), unspecified foot: Secondary | ICD-10-CM

## 2017-07-24 DIAGNOSIS — L309 Dermatitis, unspecified: Secondary | ICD-10-CM

## 2017-07-24 DIAGNOSIS — E119 Type 2 diabetes mellitus without complications: Secondary | ICD-10-CM

## 2017-07-24 MED ORDER — MELOXICAM 15 MG PO TABS: 15.0000 mg | ORAL_TABLET | Freq: Every day | ORAL | 0 refills | Status: DC

## 2017-07-24 NOTE — Progress Notes (Signed)
   Subjective:    Patient ID: Johnny Maddox, male    DOB: 1956-03-23, 61 y.o.   MRN: [id number]  HPI this patient presents the office with chief complaint of pain noted in the big toe of his left foot.  He also is concerned about a blackish crusty discoloration noted around the inside of his ankles both feet.  He states he is experiencing severe pain and discomfort between 6-10 when he ambulates on his left foot. He gives a history of having been seen previously by another doctor who recommended surgical intervention which he declined.  This patient is a diabetic with chronic venous insufficiency.  He says he is concerned about the discoloration under his inside ankle bone on both feet.  He says he was initially diagnosed as athletes foot and medication was dispensed.  No improvement was noted.  He then was given a moisturizer to apply to this area, which has been ineffective.  He presents the office today for an evaluation of the big toe joint of the left foot as well as this discoloration on both feet under his ankle bone    Review of Systems  All other systems reviewed and are negative.      Objective:   Physical Exam GENERAL APPEARANCE: Alert, conversant. Appropriately groomed. No acute distress.  VASCULAR: Pedal pulses are  palpable at  [facility] and PT bilateral.  Capillary refill time is immediate to all digits,  Normal temperature gradient.   NEUROLOGIC: sensation is normal to 5.07 monofilament at 5/5 sites bilateral.  Light touch is intact bilateral, Muscle strength normal.  MUSCULOSKELETAL: acceptable muscle strength, tone and stability bilateral.  Intrinsic muscluature intact bilateral.  Rectus appearance of foot and digits noted bilateral. Dorsomedial exostosis  B/L with valgus formation great toe left foot.  Overlapping second digit left foot.  Reduced ROM  1st MPJ right with minimal ROM 1st MPJ left foot.  DERMATOLOGIC: skin color, texture, and turgor are within normal limits.  No  preulcerative lesions or ulcers  are seen, no interdigital maceration noted.  No open lesions present.  Digital nails are asymptomatic. No drainage noted. Black and cross noted under the medial malleolus bilateral, with a blackened skin noted and extending from the base of the medial malleolus up into his lower leg.  There is a crusted skin formation noted under the medial malleolus bilaterally.  No pain associated with this condition.         Assessment & Plan:  Hallux limitus 1st MPJ  B/L   Dermatitis   IE  X-rays were taken of both feet.  The x-rays on the right foot do reveal a fracture to the tibial sesamoid and at the base of the proximal phalanx in the big toe joint.  The x-rays on his left foot reveal joint space narrowing first MPJ.  Joint malalignment and subchondral sclerosis.  Deterioration of joint cartilage.  Dorsal lipping first metatarsal. We  discussed this condition with this patient and we decided to treat him initially with anti-inflammatory medication.  Prescribed  Mobic.  Discussed possibly treating him with injection therapy if the Mobic is ineffective.  Patient must be considered a surgical candidate due to the severity of the big toe joint, left foot.  RTC 3 weeks.  Proper footgear was discussed.  Patient was also told to use hydrocortisone cream 1% on his crusty ankles.  I believe this is a dermatitis secondary to a venous stasis problem.   Gardiner Barefoot DPM

## 2017-07-24 NOTE — Addendum Note (Signed)
Addended byDeidre Ala, Tildon Silveria L on: 07/24/2017 04:33 PM   Modules accepted: Orders

## 2017-08-10 ENCOUNTER — Telehealth: Payer: Self-pay | Admitting: Urology

## 2017-08-10 NOTE — Telephone Encounter (Signed)
Please call Mr. Saab and have him reschedule his missed appointment with Dr. Erlene Quan to review his scrotal ultrasound for his testicular mass.

## 2017-08-12 NOTE — Telephone Encounter (Signed)
I got him rescheduled for 08-29-17  [facility]

## 2017-08-14 ENCOUNTER — Ambulatory Visit: Payer: 59 | Admitting: Podiatry

## 2017-08-15 DIAGNOSIS — Z794 Long term (current) use of insulin: Secondary | ICD-10-CM | POA: Diagnosis not present

## 2017-08-15 DIAGNOSIS — E1165 Type 2 diabetes mellitus with hyperglycemia: Secondary | ICD-10-CM | POA: Diagnosis not present

## 2017-08-22 ENCOUNTER — Other Ambulatory Visit: Payer: Self-pay | Admitting: Podiatry

## 2017-08-22 NOTE — Telephone Encounter (Signed)
Pt needs an appt prior to future refills. 

## 2017-08-26 DIAGNOSIS — Z23 Encounter for immunization: Secondary | ICD-10-CM | POA: Diagnosis not present

## 2017-08-29 ENCOUNTER — Encounter: Payer: Self-pay | Admitting: Urology

## 2017-08-29 ENCOUNTER — Ambulatory Visit (INDEPENDENT_AMBULATORY_CARE_PROVIDER_SITE_OTHER): Payer: 59 | Admitting: Urology

## 2017-08-29 VITALS — BP 170/105 | HR 85 | Ht 69.33 in | Wt 292.6 lb

## 2017-08-29 DIAGNOSIS — N401 Enlarged prostate with lower urinary tract symptoms: Secondary | ICD-10-CM | POA: Diagnosis not present

## 2017-08-29 DIAGNOSIS — N138 Other obstructive and reflux uropathy: Secondary | ICD-10-CM | POA: Diagnosis not present

## 2017-08-29 DIAGNOSIS — R972 Elevated prostate specific antigen [PSA]: Secondary | ICD-10-CM

## 2017-08-29 DIAGNOSIS — N509 Disorder of male genital organs, unspecified: Secondary | ICD-10-CM

## 2017-08-29 DIAGNOSIS — N5089 Other specified disorders of the male genital organs: Secondary | ICD-10-CM

## 2017-08-29 NOTE — Progress Notes (Signed)
08/29/2017 8:58 AM   Johnny Maddox 1956-01-30 [id number]  Referring provider: [name], MD [place], [place] [address]  Chief Complaint  Patient presents with  . Hydrocele  . Testicular Mass    HPI:  61 yo M with history of elevated PSA, large right symptomatic hydrocele s/p Right inguinal approach hydrocelectomy on 09/23/2016, and  left intratesticular neoplasm.    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  He has been performing regular self testicular exams without change.  No tenderness or symptoms.  Tumor markers previously negative.    He denies a family history of testicular cancer.  Personal history of elevated PSA.  PSA trend as below.  His most recent PSA is 3.4 on 05/23/2017.  He did have an upward trending PSA to 6.2, but had a brief hiatus around this time from his finasteride.  He since resumed this medication his PSA is trending back down again.  Personal history of BPH, symptoms well controlled on finasteride.  Also has personal history of erectile dysfunction does well with sildenafil.   PMH: Past Medical History:  Diagnosis Date  . Acute laryngitis, without mention of obstruction   . Allergic rhinitis, cause unspecified   . Chalazion   . Cough   . Dermatophytosis of foot   . Diabetes mellitus without complication ([place])   . Elevated PSA   . Hyperlipidemia   . Hypertension   . Microalbuminuria   . Overweight   . Seborrheic dermatitis, unspecified   . Sleep apnea   . Unspecified disorder of autonomic nervous system   . Unspecified sleep apnea   . Unspecified venous (peripheral) insufficiency   . Unspecified vitamin D deficiency     Surgical History: Past Surgical History:  Procedure Laterality Date  .  CIRCUMCISION    . HYDROCELE EXCISION Right 09/23/2016   Procedure: HYDROCELECTOMY ADULT ;  Surgeon: Hollice Espy, MD;  Location: [facility] ORS;  Service: Urology;  Laterality: Right;  INGUINAL APPROACH  . ROTATOR CUFF REPAIR  R 2011, L 2006   L and R shoulder  . TONSILLECTOMY AND ADENOIDECTOMY      Home Medications:  Allergies as of 08/29/2017      Reactions   Other Anaphylaxis   Mushrooms   Seasonal Ic [cholestatin]       Medication List       Accurate as of 08/29/17 11:59 PM. Always use your most recent med list.          aspirin EC 81 MG tablet Take 81 mg by mouth every evening.   atorvastatin 40 MG tablet Commonly known as:  LIPITOR Take 40 mg by mouth every evening.   finasteride 5 MG tablet Commonly known as:  PROSCAR Take 1 tablet (5 mg total) by mouth daily.   FREESTYLE LIBRE READER Devi Use 1 Units as directed.   furosemide 20 MG tablet Commonly known as:  LASIX Take 20 mg by mouth every evening. Reported on 11/22/2015   HUMALOG KWIKPEN 100 UNIT/ML KiwkPen Generic drug:  insulin lispro INJECT 12 UNITS SUBCUTANEOUSLY DAILY AT 7 AM.   Insulin Glargine 100 UNIT/ML Solostar Pen Commonly known as:  LANTUS Inject 45 Units into the skin daily at 10 pm.   lisinopril 5 MG tablet Commonly known as:  PRINIVIL,ZESTRIL Take 5  mg by mouth every evening.   metFORMIN 1000 MG tablet Commonly known as:  GLUCOPHAGE TAKE 1 TABLET (1,000 MG TOTAL) BY MOUTH 2 (TWO) TIMES DAILY WITH MEALS.   MULTI-VITAMINS Tabs Take by mouth.   sildenafil 20 MG tablet Commonly known as:  REVATIO Take 3 to 5 tablets two hours before intercouse on an empty stomach.  Do not take with nitrates.   TRULICITY 1.5 GT/3.6IW Sopn Generic drug:  Dulaglutide Inject 1.5 mg into the skin every Saturday.       Allergies:  Allergies  Allergen Reactions  . Other Anaphylaxis    Mushrooms  . Seasonal Ic [Cholestatin]     Family History: Family History  Problem Relation Age of Onset  .  Cancer Mother        leukemia  . Hypertension Mother   . Alcohol abuse Father   . Hypertension Brother   . Prostate cancer Neg Hx   . Bladder Cancer Neg Hx   . Kidney disease Neg Hx   . Kidney cancer Neg Hx     Social History:  reports that he has quit smoking. He has never used smokeless tobacco. He reports that he drinks about 1.2 oz of alcohol per week . He reports that he does not use drugs.  ROS: UROLOGY Frequent Urination?: No Hard to postpone urination?: No Burning/pain with urination?: No Get up at night to urinate?: No Leakage of urine?: No Urine stream starts and stops?: No Trouble starting stream?: No Do you have to strain to urinate?: No Blood in urine?: No Urinary tract infection?: No Sexually transmitted disease?: No Injury to kidneys or bladder?: No Painful intercourse?: No Weak stream?: No Erection problems?: No Penile pain?: No  Gastrointestinal Nausea?: No Vomiting?: No Indigestion/heartburn?: No Diarrhea?: No Constipation?: No  Constitutional Fever: No Night sweats?: No Weight loss?: No Fatigue?: No  Skin Skin rash/lesions?: No Itching?: No  Eyes Blurred vision?: No Double vision?: No  Ears/Nose/Throat Sore throat?: No Sinus problems?: No  Hematologic/Lymphatic Swollen glands?: No Easy bruising?: No  Cardiovascular Leg swelling?: No Chest pain?: No  Respiratory Cough?: Yes Shortness of breath?: No  Endocrine Excessive thirst?: No  Musculoskeletal Back pain?: No Joint pain?: No  Neurological Headaches?: No Dizziness?: No  Psychologic Depression?: No Anxiety?: No  Physical Exam: BP (!) 170/105 (BP Location: Right Arm, Patient Position: Sitting, Cuff Size: Normal)   Pulse 85   Ht 5' 9.33" (1.761 m)   Wt 292 lb 9.6 oz (132.7 kg)   BMI 42.80 kg/m   Constitutional:  Alert and oriented, No acute distress. HEENT: [place] AT, moist mucus membranes.  Trachea midline, no masses. Cardiovascular: No clubbing, cyanosis, or  edema. Respiratory: Normal respiratory effort, no increased work of breathing. GI: Abdomen is soft, nontender, nondistended, no abdominal masses.  Obese.  GU: Lateral testicles descended, no obvious lesions or tumors palpated bilaterally.  Hydroceles completely resolved.  Buried penis. Skin: No rashes, bruises or suspicious lesions. Lymph: No inguinal adenopathy. Neurologic: Grossly intact, no focal deficits, moving all 4 extremities. Psychiatric: Normal mood and affect.  Laboratory Data: Lab Results  Component Value Date   WBC 5.5 12/28/2012   HGB 14.1 12/28/2012   HCT 42.7 12/28/2012   MCV 81.6 12/28/2012   PLT 152 12/28/2012    Lab Results  Component Value Date   CREATININE 1.03 05/23/2017   Component     Latest Ref Rng & Units 08/13/2016  AFP Tumor Marker     0.0 - 8.3 ng/mL 4.1  Component     Latest Ref Rng & Units 08/13/2016  LDH     121 - 224 IU/L 141   Component     Latest Ref Rng & Units 08/13/2016  hCG Quant     0 - 3 mIU/mL <1   Lab Results  Component Value Date   HGBA1C 8.1 05/23/2017    Component     Latest Ref Rng & Units 05/08/2015 10/11/2015 04/03/2016 04/10/2016  Prostate Specific Ag, Serum     0.0 - 4.0 ng/mL 2.6 2.5 4.4 (H) 4.0   Component     Latest Ref Rng & Units 10/11/2016 02/04/2017 05/23/2017  Prostate Specific Ag, Serum     0.0 - 4.0 ng/mL 6.2 (H) 3.7 3.4   Pertinent Imaging: No new imaging since surgery  Assessment & Plan:    1. Testicular mass  4 mm left hypoechoic mass within the testicle.  Tumor markers previously negative. Renal ultrasound continues to remain stable, increasing weight interval for surveillance Recommend continuing to follow this lesion --> scrotal ultrasound at 1 year interval 7/19 Continue to encourage monthly self testicular exams  2. BPH with LUTS Stable on finasteride  3. Elevated PSA  Fluctuation in PSA likely related to finasteride effect, will continue to monitor Repeat PSA/DRE in 05/2018  Return in  about 9 months (around 05/29/2018) for scrotal ultrasound/ PSA (prior)/ DRE/ IPSS.   Hollice Espy, MD  [facility] [address] [place], [place] [address] [phone]

## 2017-09-05 DIAGNOSIS — E1169 Type 2 diabetes mellitus with other specified complication: Secondary | ICD-10-CM | POA: Diagnosis not present

## 2017-09-05 DIAGNOSIS — E1159 Type 2 diabetes mellitus with other circulatory complications: Secondary | ICD-10-CM | POA: Diagnosis not present

## 2017-09-05 DIAGNOSIS — E1129 Type 2 diabetes mellitus with other diabetic kidney complication: Secondary | ICD-10-CM | POA: Diagnosis not present

## 2017-09-08 DIAGNOSIS — I10 Essential (primary) hypertension: Secondary | ICD-10-CM | POA: Insufficient documentation

## 2017-09-08 DIAGNOSIS — I152 Hypertension secondary to endocrine disorders: Secondary | ICD-10-CM | POA: Insufficient documentation

## 2017-09-08 DIAGNOSIS — E1159 Type 2 diabetes mellitus with other circulatory complications: Secondary | ICD-10-CM | POA: Insufficient documentation

## 2017-09-26 ENCOUNTER — Other Ambulatory Visit: Payer: Self-pay | Admitting: Podiatry

## 2017-09-28 IMAGING — DX DG CHEST 2V
2 series · 2 of 2 positions shown · non-contrast
Comparison: None in PACs

CLINICAL DATA: Eight weeks of cough, nonsmoker.

EXAM:
CHEST  2 VIEW

[chest pa]
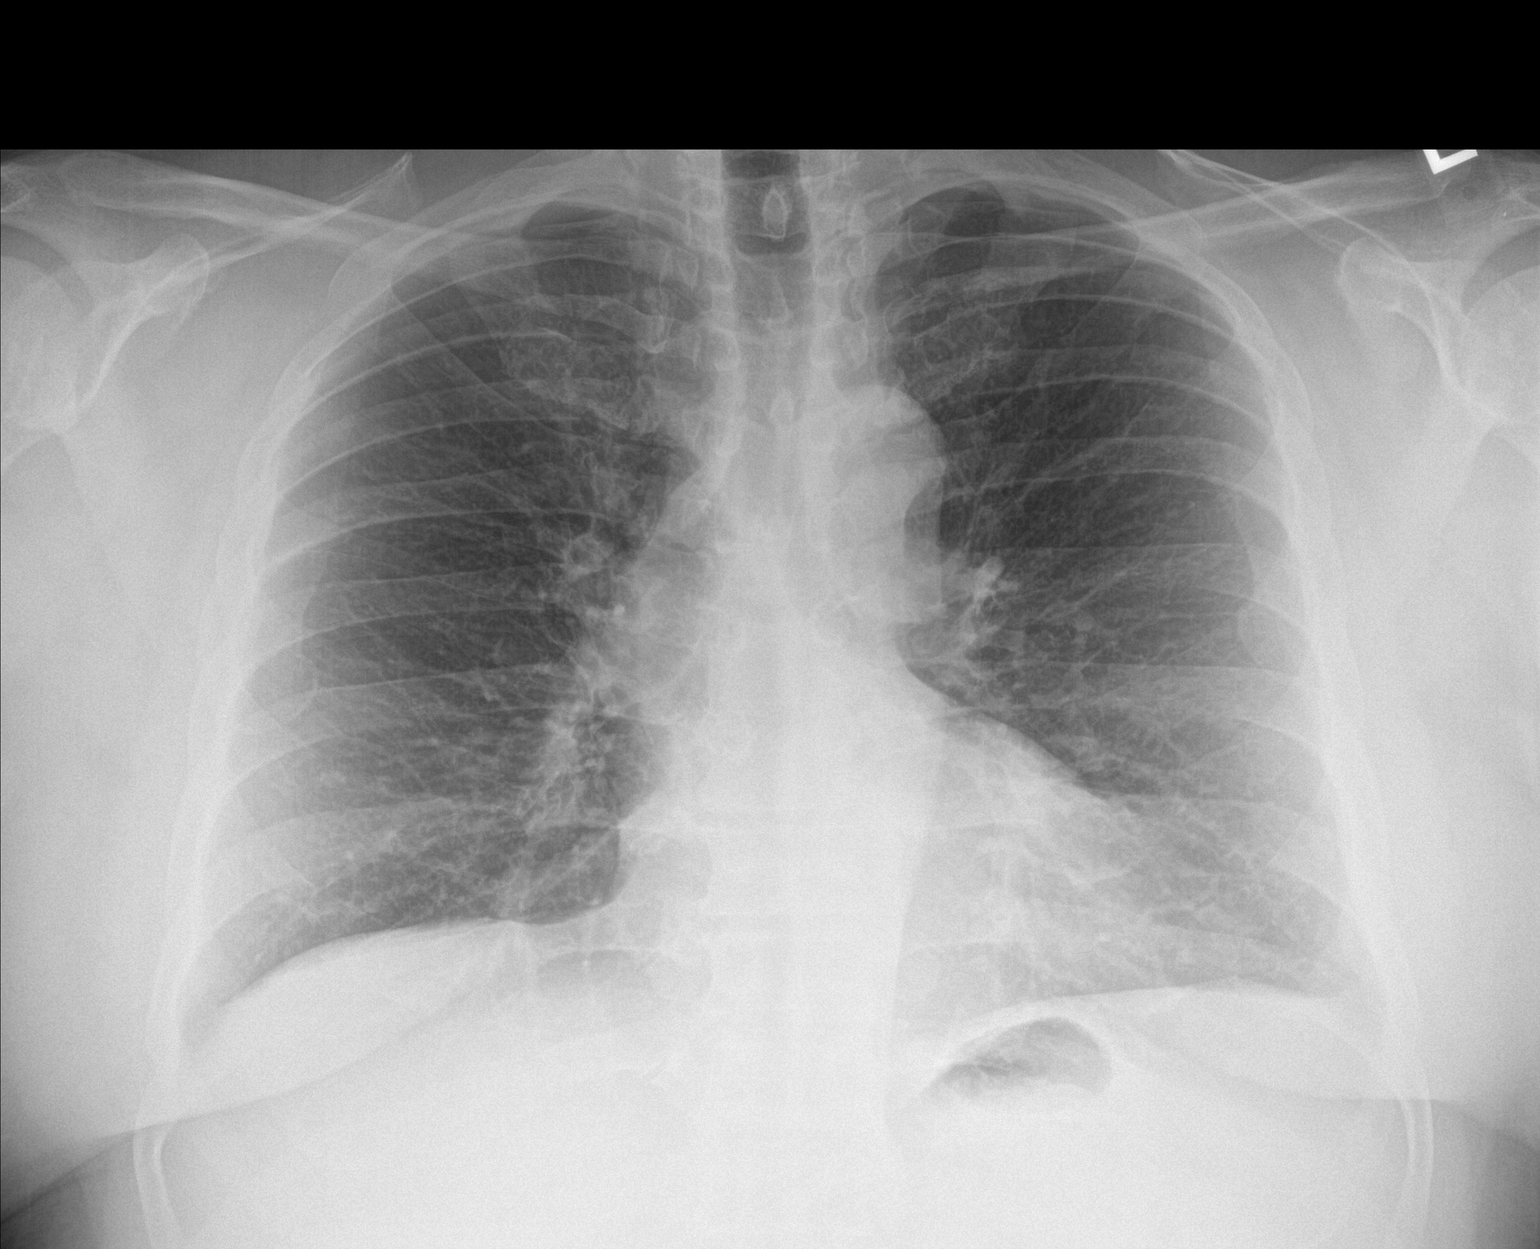

[chest lat]
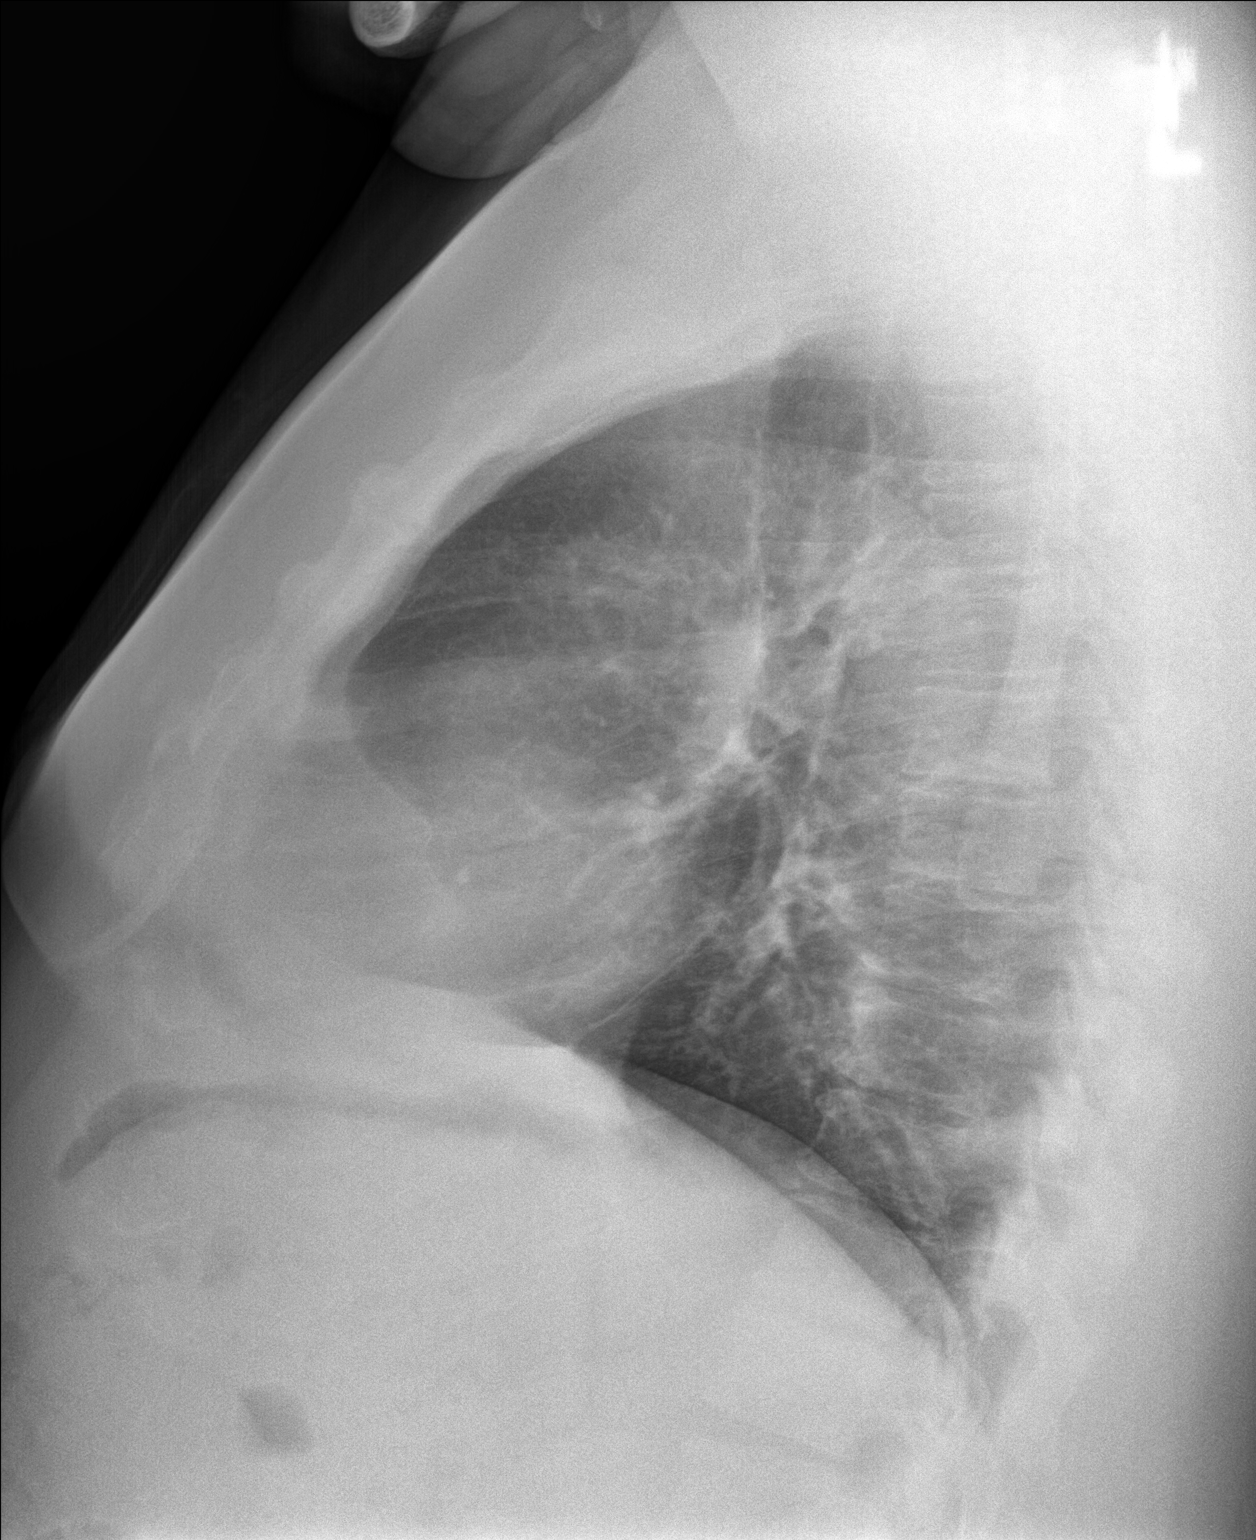

[2 of 2 positions shown; findings below may reference images not displayed]

FINDINGS: The lungs are adequately inflated. There is no focal infiltrate.
There is no pleural effusion or pneumothorax. The heart and
pulmonary vascularity are normal. The mediastinum is normal in
width. The bony thorax exhibits no acute abnormality.
IMPRESSION: There is no active cardiopulmonary disease.

## 2017-10-20 ENCOUNTER — Ambulatory Visit: Payer: 59 | Admitting: Podiatry

## 2017-11-17 DIAGNOSIS — Z1211 Encounter for screening for malignant neoplasm of colon: Secondary | ICD-10-CM | POA: Diagnosis not present

## 2017-12-04 DIAGNOSIS — K5730 Diverticulosis of large intestine without perforation or abscess without bleeding: Secondary | ICD-10-CM | POA: Diagnosis not present

## 2017-12-04 DIAGNOSIS — Z1211 Encounter for screening for malignant neoplasm of colon: Secondary | ICD-10-CM | POA: Diagnosis not present

## 2017-12-05 DIAGNOSIS — E1159 Type 2 diabetes mellitus with other circulatory complications: Secondary | ICD-10-CM | POA: Diagnosis not present

## 2017-12-05 DIAGNOSIS — R809 Proteinuria, unspecified: Secondary | ICD-10-CM | POA: Diagnosis not present

## 2017-12-05 DIAGNOSIS — E1169 Type 2 diabetes mellitus with other specified complication: Secondary | ICD-10-CM | POA: Diagnosis not present

## 2017-12-05 DIAGNOSIS — E785 Hyperlipidemia, unspecified: Secondary | ICD-10-CM | POA: Diagnosis not present

## 2017-12-05 DIAGNOSIS — I10 Essential (primary) hypertension: Secondary | ICD-10-CM | POA: Diagnosis not present

## 2017-12-05 DIAGNOSIS — Z794 Long term (current) use of insulin: Secondary | ICD-10-CM | POA: Diagnosis not present

## 2017-12-05 DIAGNOSIS — E1129 Type 2 diabetes mellitus with other diabetic kidney complication: Secondary | ICD-10-CM | POA: Diagnosis not present

## 2017-12-10 DIAGNOSIS — E1159 Type 2 diabetes mellitus with other circulatory complications: Secondary | ICD-10-CM | POA: Diagnosis not present

## 2017-12-10 DIAGNOSIS — E1129 Type 2 diabetes mellitus with other diabetic kidney complication: Secondary | ICD-10-CM | POA: Diagnosis not present

## 2017-12-10 DIAGNOSIS — E1169 Type 2 diabetes mellitus with other specified complication: Secondary | ICD-10-CM | POA: Diagnosis not present

## 2017-12-15 ENCOUNTER — Telehealth: Payer: Self-pay | Admitting: Urology

## 2017-12-15 ENCOUNTER — Encounter: Payer: Self-pay | Admitting: Family Medicine

## 2017-12-15 DIAGNOSIS — K5730 Diverticulosis of large intestine without perforation or abscess without bleeding: Secondary | ICD-10-CM | POA: Insufficient documentation

## 2017-12-15 NOTE — Telephone Encounter (Signed)
Patient is asking for a refill sildenafil. He has not been seen by Larene Beach since 03-2017, I told him she was out of the office and if he needed a OV prior that he would have to see someone else. He was ok with that. Please advise if he can have more or if he needs an OV first.   Sharyn Lull

## 2017-12-18 MED ORDER — SILDENAFIL CITRATE 20 MG PO TABS: ORAL_TABLET | ORAL | 3 refills | Status: DC

## 2017-12-18 NOTE — Telephone Encounter (Signed)
Medication refilled

## 2017-12-19 ENCOUNTER — Other Ambulatory Visit: Payer: Self-pay

## 2017-12-19 MED ORDER — SILDENAFIL CITRATE 20 MG PO TABS: ORAL_TABLET | ORAL | 3 refills | Status: DC

## 2017-12-22 ENCOUNTER — Other Ambulatory Visit: Payer: Self-pay

## 2017-12-22 ENCOUNTER — Ambulatory Visit (INDEPENDENT_AMBULATORY_CARE_PROVIDER_SITE_OTHER): Payer: 59 | Admitting: Family Medicine

## 2017-12-22 ENCOUNTER — Encounter: Payer: Self-pay | Admitting: Family Medicine

## 2017-12-22 VITALS — BP 134/86 | HR 92 | Temp 98.8°F | Resp 17 | Ht 69.33 in | Wt 275.2 lb

## 2017-12-22 DIAGNOSIS — R05 Cough: Secondary | ICD-10-CM

## 2017-12-22 DIAGNOSIS — Z113 Encounter for screening for infections with a predominantly sexual mode of transmission: Secondary | ICD-10-CM | POA: Diagnosis not present

## 2017-12-22 DIAGNOSIS — R869 Unspecified abnormal finding in specimens from male genital organs: Secondary | ICD-10-CM

## 2017-12-22 DIAGNOSIS — R059 Cough, unspecified: Secondary | ICD-10-CM

## 2017-12-22 DIAGNOSIS — R0982 Postnasal drip: Secondary | ICD-10-CM | POA: Diagnosis not present

## 2017-12-22 DIAGNOSIS — E1165 Type 2 diabetes mellitus with hyperglycemia: Secondary | ICD-10-CM | POA: Diagnosis not present

## 2017-12-22 MED ORDER — FLUTICASONE PROPIONATE 50 MCG/ACT NA SUSP: 2.0000 | Freq: Every day | NASAL | 6 refills | Status: DC

## 2017-12-22 MED ORDER — FAMOTIDINE 20 MG PO TABS: 20.0000 mg | ORAL_TABLET | Freq: Two times a day (BID) | ORAL | Status: DC

## 2017-12-22 MED ORDER — CETIRIZINE HCL 10 MG PO TABS: 10.0000 mg | ORAL_TABLET | Freq: Every day | ORAL | 11 refills | Status: DC

## 2017-12-22 NOTE — Patient Instructions (Addendum)
At bedtime Take pepcid Use nasal spray fluticasone And take zyrtec  This will help with the throat clearing    IF you received an x-ray today, you will receive an invoice from [facility]. Please contact [facility] at [phone] with questions or concerns regarding your invoice.   IF you received labwork today, you will receive an invoice from [place]. Please contact [facility] at [phone] with questions or concerns regarding your invoice.   Our billing staff will not be able to assist you with questions regarding bills from these companies.  You will be contacted with the lab results as soon as they are available. The fastest way to get your results is to activate your My Chart account. Instructions are located on the last page of this paperwork. If you have not heard from [place] regarding the results in 2 weeks, please contact this office.     Globus Pharyngeus Globus pharyngeus is a condition that makes it feel like you have a lump in your throat. It may also feel like you have something stuck in the front of your throat. This feeling may come and go. It is not painful, and it does not make it harder to swallow food or liquid. Globus pharyngeus does not cause changes that a health care provider can see during a physical exam. This condition usually goes away without treatment. What are the causes? Often, no cause can be found. The most common cause of globus pharyngeus is a condition that causes stomach juices to flow back up into the throat (gastroesophageal reflux). Other possible causes include:  Overstimulation of nerves that control swallowing.  Irritation of nerves that control swallowing (neuralgia).  An enlarged gland in the lower neck (thyroid gland).  Growth of tonsil tissue at the base of the tongue (lingual tonsil).  Anxiety.  Depression.  What are the signs or symptoms? The main symptom of this condition is a feeling of a lump in your throat.  This feeling usually comes and goes. How is this diagnosed? This condition may be diagnosed after other conditions have been ruled out. You may have tests, such as:  A swallow study.  Ear, nose, and throat evaluation.  An exam of your throat using a thin, flexible tube with a light and camera on the end (endoscopy).  How is this treated? This condition may go away on its own, without treatment. In some cases, antidepressant medicines may be helpful. Follow these instructions at home:  Follow instructions from your health care provider about eating or drinking restrictions.  Take over-the-counter and prescription medicines only as told by your health care provider.  Keep all follow-up visits as told by your health care provider. This is important.  Follow instructions from your health care provider about home care for gastroesophageal reflux. Your health care provider may recommend that you: ? Do not eat or drink anything that causes heartburn. ? Do not eat heavy meals close to bedtime. ? Do not drink caffeine. ? Do not drink alcohol. ? Raise the head of your bed. ? Sleep on your left side. Contact a health care provider if:  Your symptoms get worse.  You have throat pain.  You have trouble swallowing.  Food or liquid comes back up into your mouth.  You lose weight without trying. Get help right away if:  You develop swelling in your throat. Summary  Globus pharyngeus is a condition that makes it feel like you have a lump in your throat.  This condition usually  goes away without treatment. This information is not intended to replace advice given to you by your health care provider. Make sure you discuss any questions you have with your health care provider. Document Released: 07/03/2016 Document Revised: 07/03/2016 Document Reviewed: 07/03/2016 Elsevier Interactive Patient Education  [employer].

## 2017-12-22 NOTE — Progress Notes (Signed)
Chief Complaint  Patient presents with  . persistent cough x 3 weeks    tried claritin for cough but no relief so discontinued, fatigue alot, sti checked    HPI   Cough and postnasal drip- new problem 3 week history of cough and throat clearing Pt reports that he has been coughing that is nonproductive and there are times when he will stop talking due to coughing  He is constantly clearing his throat States that he has had postnasal drip for years He uses a nasal spray that he was using that   Semen abnormality and std screening- new problem He reports that he has yellow semen States that he has been sexually active with a male partner who has been complaining that the color of his semen is very yellow. He denies any dysuria, hematuria, blood in the semen, pain with ejaculation. He denies testicular pain  Uncontrolled Diabetes- deterioration of chronic problem He sees Endocrinology and has uncontrolled diabetes He reports that his medications were adjusted and he should be monitoring with the free style libre Lab Results  Component Value Date   HGBA1C 8.1 05/23/2017   He states that he was told his a1c was in a good range.  Review of the care everywhere shows that his a1c is 12.1% on 12/05/2017 He reports polydipsia but no polyphagia or polyuria He denies symptoms of hypoglycemia   Past Medical History:  Diagnosis Date  . Acute laryngitis, without mention of obstruction   . Allergic rhinitis, cause unspecified   . Chalazion   . Cough   . Dermatophytosis of foot   . Diabetes mellitus without complication ([place])   . Elevated PSA   . Hyperlipidemia   . Hypertension   . Microalbuminuria   . Overweight   . Seborrheic dermatitis, unspecified   . Sleep apnea   . Unspecified disorder of autonomic nervous system   . Unspecified sleep apnea   . Unspecified venous (peripheral) insufficiency   . Unspecified vitamin D deficiency     Current Outpatient Medications    Medication Sig Dispense Refill  . aspirin EC 81 MG tablet Take 81 mg by mouth every evening.     Marland Kitchen atorvastatin (LIPITOR) 40 MG tablet Take 40 mg by mouth every evening.    . Continuous Blood Gluc Receiver (FREESTYLE LIBRE READER) DEVI Use 1 Units as directed.    . Dulaglutide (TRULICITY) 1.5 DG/6.4QI SOPN Inject 1.5 mg into the skin every Saturday.     . finasteride (PROSCAR) 5 MG tablet Take 1 tablet (5 mg total) by mouth daily. 90 tablet 4  . furosemide (LASIX) 20 MG tablet Take 20 mg by mouth every evening. Reported on 11/22/2015    . HUMALOG KWIKPEN 100 UNIT/ML KiwkPen INJECT 12 UNITS SUBCUTANEOUSLY DAILY AT 7 AM.  12  . Insulin Glargine (LANTUS) 100 UNIT/ML Solostar Pen Inject 45 Units into the skin daily at 10 pm.    . lisinopril (PRINIVIL,ZESTRIL) 5 MG tablet Take 5 mg by mouth every evening.     . Multiple Vitamin (MULTI-VITAMINS) TABS Take by mouth.    . cetirizine (ZYRTEC) 10 MG tablet Take 1 tablet (10 mg total) by mouth daily. 30 tablet 11  . famotidine (PEPCID) 20 MG tablet Take 1 tablet (20 mg total) by mouth 2 (two) times daily.    . fluticasone (FLONASE) 50 MCG/ACT nasal spray Place 2 sprays into both nostrils daily. 16 g 6  . sildenafil (REVATIO) 20 MG tablet Take 3 to 5 tablets two  hours before intercouse on an empty stomach.  Do not take with nitrates. 50 tablet 3   No current facility-administered medications for this visit.     Allergies:  Allergies  Allergen Reactions  . Other Anaphylaxis    Mushrooms  . Seasonal Ic [Cholestatin]     Past Surgical History:  Procedure Laterality Date  . CIRCUMCISION    . HYDROCELE EXCISION Right 09/23/2016   Procedure: HYDROCELECTOMY ADULT ;  Surgeon: Hollice Espy, MD;  Location: [facility] ORS;  Service: Urology;  Laterality: Right;  INGUINAL APPROACH  . ROTATOR CUFF REPAIR  R 2011, L 2006   L and R shoulder  . TONSILLECTOMY AND ADENOIDECTOMY      Social History   Socioeconomic History  . Marital status: Divorced    Spouse  name: n/a  . Number of children: 2  . Years of education: 35  . Highest education level: None  Social Needs  . Financial resource strain: None  . Food insecurity - worry: None  . Food insecurity - inability: None  . Transportation needs - medical: None  . Transportation needs - non-medical: None  Occupational History  . 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 Smoking status: Former Research scientist (life sciences)  . Smokeless tobacco: Never Used  . Tobacco comment: quit over 40 years ago  Substance and Sexual Activity  . Alcohol use: Yes    Alcohol/week: 1.2 oz    Types: 2 Cans of beer per week    Comment: paranoia about alcholism; alcohol makes me sleepy.  . Drug use: No  . Sexual activity: Yes    Partners: Female    Birth control/protection: Condom  Other Topics Concern  . None  Social History Narrative   Lives alone. Adult son is in the [employer].  His daughter lives with her mother (his ex-wife).    Separated; after 6 years of marriage. Not dating.   Always uses seat belts.    Smoke alarm in the home.    No guns in the home.    Exercise: Moderate 3 x week, 20-30 minutes,walking, member of the [facility].   Caffeine use: Moderate.    Family History  Problem Relation Age of Onset  . Cancer Mother        leukemia  . Hypertension Mother   . Alcohol abuse Father   . Hypertension Brother   . Prostate cancer Neg Hx   . Bladder Cancer Neg Hx   . Kidney disease Neg Hx   . Kidney cancer Neg Hx      Review of Systems  Constitutional: Negative for chills, fever, malaise/fatigue and weight loss.  HENT: Positive for congestion. Negative for ear pain, sinus pain and sore throat.   Respiratory: Positive for cough. Negative for shortness of breath and wheezing.   Cardiovascular: Negative for chest pain and palpitations.  Gastrointestinal: Negative for blood in stool, constipation, diarrhea, nausea and vomiting.  Genitourinary: Negative for dysuria, flank pain, frequency,  hematuria and urgency.  Musculoskeletal: Negative for myalgias and neck pain.  Skin: Negative for itching and rash.  Neurological: Negative for dizziness, tingling, tremors and headaches.  Psychiatric/Behavioral: Negative for depression. The patient is not nervous/anxious.       Objective: Vitals:   12/22/17 1654  BP: 134/86  Pulse: 92  Resp: 17  Temp: 98.8 F (37.1 C)  TempSrc: Oral  SpO2: 96%  Weight: 275 lb 3.2 oz (124.8 kg)  Height: 5' 9.33" (1.761 m)    Physical  Exam  Constitutional: He is oriented to person, place, and time. He appears well-developed and well-nourished.  HENT:  Head: Normocephalic and atraumatic.  Eyes: Conjunctivae and EOM are normal.  Neck: Normal range of motion. No thyromegaly present.  Cardiovascular: Normal rate, regular rhythm and normal heart sounds.  No murmur heard. Pulmonary/Chest: Effort normal and breath sounds normal. No stridor. No respiratory distress.  Abdominal: Soft. Bowel sounds are normal. He exhibits no distension and no mass. There is no tenderness. There is no rebound and no guarding. No hernia.  Neurological: He is alert and oriented to person, place, and time.  Skin: Skin is warm. Capillary refill takes less than 2 seconds.  Psychiatric: He has a normal mood and affect. His behavior is normal. Judgment and thought content normal.    Assessment and Plan Dell was seen today for persistent cough x 3 weeks.  Diagnoses and all orders for this visit:  Cough- discuss pepcid for laryngeal reflux which can cause cough Also discussed antihistamine and nasal steroid -     famotidine (PEPCID) 20 MG tablet; Take 1 tablet (20 mg total) by mouth 2 (two) times daily. -     fluticasone (FLONASE) 50 MCG/ACT nasal spray; Place 2 sprays into both nostrils daily. -     cetirizine (ZYRTEC) 10 MG tablet; Take 1 tablet (10 mg total) by mouth daily.  Screen for STD (sexually transmitted disease)- verbal consent given -     HIV antibody -      Hepatitis B surface antigen -     GC/Chlamydia Probe Amp -     RPR  Semen abnormal- will check for prostate inflammation Deferred GU exam since he sees Urology and is currently not having red flag signs -     PSA  Uncontrolled type 2 diabetes mellitus with hyperglycemia ([place])- discussed his goals for diabetes management  Pt to continue Endocrinology recommendations He will limit late night snacking  Postnasal drip -     famotidine (PEPCID) 20 MG tablet; Take 1 tablet (20 mg total) by mouth 2 (two) times daily. -     fluticasone (FLONASE) 50 MCG/ACT nasal spray; Place 2 sprays into both nostrils daily. -     cetirizine (ZYRTEC) 10 MG tablet; Take 1 tablet (10 mg total) by mouth daily.  Other orders -     Cancel: Flu Vaccine QUAD 36+ mos IM -     Cancel: POCT glycosylated hemoglobin (Hb A1C)  A total of 40 minutes were spent face-to-face with the patient during this encounter and over half of that time was spent on counseling and coordination of care.    [place]

## 2017-12-23 LAB — PSA: Prostate Specific Ag, Serum: 3.3 ng/mL (ref 0.0–4.0)

## 2017-12-23 LAB — GC/CHLAMYDIA PROBE AMP
Chlamydia trachomatis, NAA: NEGATIVE
Neisseria gonorrhoeae by PCR: NEGATIVE

## 2017-12-23 LAB — HEPATITIS B SURFACE ANTIGEN: Hepatitis B Surface Ag: NEGATIVE

## 2017-12-23 LAB — RPR: RPR Ser Ql: NONREACTIVE

## 2017-12-23 LAB — HIV ANTIBODY (ROUTINE TESTING W REFLEX): HIV Screen 4th Generation wRfx: NONREACTIVE

## 2017-12-23 NOTE — Progress Notes (Signed)
12/24/2017 9:44 AM   Johnny Maddox [birth year] [id number]  Referring provider: [name], MD [place], [place] [id number]  Chief Complaint  Patient presents with  . Follow-up    HPI:  62 yo M with history of elevated PSA, large right symptomatic hydrocele s/p right inguinal approach hydrocelectomy on 09/23/2016, and a left intratesticular neoplasm who presents today with the complaint of yellow semen.     Newly identified left intratesticular 5 x 4 x 4 mm hypoechoic mass within the left testicle which was not previously appreciated on scrotal ultrasound 05/2015.  This was stable from scrotal ultrasound performed on 08/13/2016 to 09/16/2016, 11/22/2016, and most recently 05/12/17.  He has been performing regular self testicular exams without change.  No tenderness or symptoms.  Repeat scrotal ultrasound expected in 05/2018.    Tumor markers previously negative.  He denies a family history of testicular cancer.  Patient has been sexually active.  He states it was a masturbatory stimulation.    He denies any penile discharge.  Patient denies any gross hematuria, dysuria or suprapubic/flank pain.  Patient denies any fevers, chills, nausea or vomiting.  STI testing performed at PCP's office were negative.  Has had a recent URI.   He has not had any urinary symptoms.    Personal history of elevated PSA.  PSA trend as below.  His most recent PSA is 3.3 on 12/22/2017.  He did have an upward trending PSA to 6.2, but had a brief hiatus around this time from his finasteride.  He since resumed this medication his PSA is trending back down again.  Repeated PSA expected in 05/2018.    Personal history of BPH, symptoms well controlled on finasteride.  Also has personal history of erectile dysfunction does well with sildenafil.   PMH: Past Medical History:  Diagnosis Date  . Acute laryngitis, without mention of obstruction   . Allergic rhinitis, cause unspecified   . Chalazion   . Cough     . Dermatophytosis of foot   . Diabetes mellitus without complication ([place])   . Elevated PSA   . Hyperlipidemia   . Hypertension   . Microalbuminuria   . Overweight   . Seborrheic dermatitis, unspecified   . Sleep apnea   . Unspecified disorder of autonomic nervous system   . Unspecified sleep apnea   . Unspecified venous (peripheral) insufficiency   . Unspecified vitamin D deficiency     Surgical History: Past Surgical History:  Procedure Laterality Date  . CIRCUMCISION    . HYDROCELE EXCISION Right 09/23/2016   Procedure: HYDROCELECTOMY ADULT ;  Surgeon: Hollice Espy, MD;  Location: [facility] ORS;  Service: Urology;  Laterality: Right;  INGUINAL APPROACH  . ROTATOR CUFF REPAIR  R 2011, L 2006   L and R shoulder  . TONSILLECTOMY AND ADENOIDECTOMY      Home Medications:  Allergies as of 12/24/2017      Reactions   Other Anaphylaxis   Mushrooms   Seasonal Ic [cholestatin]       Medication List        Accurate as of 12/24/17  9:44 AM. Always use your most recent med list.          aspirin EC 81 MG tablet Take 81 mg by mouth every evening.   atorvastatin 40 MG tablet Commonly known as:  LIPITOR Take 40 mg by mouth every evening.   cetirizine 10 MG tablet Commonly known as:  ZYRTEC Take 1 tablet (10 mg  total) by mouth daily.   famotidine 20 MG tablet Commonly known as:  PEPCID Take 1 tablet (20 mg total) by mouth 2 (two) times daily.   finasteride 5 MG tablet Commonly known as:  PROSCAR Take 1 tablet (5 mg total) by mouth daily.   fluticasone 50 MCG/ACT nasal spray Commonly known as:  FLONASE Place 2 sprays into both nostrils daily.   FREESTYLE LIBRE READER Devi Use 1 Units as directed.   furosemide 20 MG tablet Commonly known as:  LASIX Take 20 mg by mouth every evening. Reported on 11/22/2015   HUMALOG KWIKPEN 100 UNIT/ML KiwkPen Generic drug:  insulin lispro INJECT 12 UNITS SUBCUTANEOUSLY DAILY AT 7 AM.   Insulin Glargine 100 UNIT/ML Solostar  Pen Commonly known as:  LANTUS Inject 45 Units into the skin daily at 10 pm.   lisinopril 5 MG tablet Commonly known as:  PRINIVIL,ZESTRIL Take 5 mg by mouth every evening.   MULTI-VITAMINS Tabs Take by mouth.   sildenafil 20 MG tablet Commonly known as:  REVATIO Take 3 to 5 tablets two hours before intercouse on an empty stomach.  Do not take with nitrates.   TRULICITY 1.5 GM/0.1UU Sopn Generic drug:  Dulaglutide Inject 1.5 mg into the skin every Saturday.       Allergies:  Allergies  Allergen Reactions  . Other Anaphylaxis    Mushrooms  . Seasonal Ic [Cholestatin]     Family History: Family History  Problem Relation Age of Onset  . Cancer Mother        leukemia  . Hypertension Mother   . Alcohol abuse Father   . Hypertension Brother   . Prostate cancer Neg Hx   . Bladder Cancer Neg Hx   . Kidney disease Neg Hx   . Kidney cancer Neg Hx     Social History:  reports that he has quit smoking. he has never used smokeless tobacco. He reports that he drinks about 1.2 oz of alcohol per week. He reports that he does not use drugs.  ROS: UROLOGY Frequent Urination?: No Hard to postpone urination?: No Burning/pain with urination?: No Get up at night to urinate?: No Leakage of urine?: No Urine stream starts and stops?: No Trouble starting stream?: No Do you have to strain to urinate?: No Blood in urine?: No Urinary tract infection?: No Sexually transmitted disease?: No Injury to kidneys or bladder?: No Painful intercourse?: No Weak stream?: No Erection problems?: Yes Penile pain?: No  Gastrointestinal Nausea?: No Vomiting?: No Indigestion/heartburn?: No Diarrhea?: No Constipation?: No  Constitutional Fever: No Night sweats?: No Weight loss?: No Fatigue?: No  Skin Skin rash/lesions?: No Itching?: No  Eyes Blurred vision?: No Double vision?: No  Ears/Nose/Throat Sore throat?: No Sinus problems?: No  Hematologic/Lymphatic Swollen glands?:  No Easy bruising?: No  Cardiovascular Leg swelling?: No Chest pain?: No  Respiratory Cough?: No Shortness of breath?: No  Endocrine Excessive thirst?: No  Musculoskeletal Back pain?: No Joint pain?: No  Neurological Headaches?: No Dizziness?: No  Psychologic Depression?: No Anxiety?: No  Physical Exam: BP 120/86   Pulse (!) 102   Resp 15   Ht 5\' 9"  (1.753 m)   Wt 208 lb (94.3 kg)   BMI 30.72 kg/m   Constitutional: Well nourished. Alert and oriented, No acute distress. HEENT: [place] AT, moist mucus membranes. Trachea midline, no masses. Cardiovascular: No clubbing, cyanosis, or edema. Respiratory: Normal respiratory effort, no increased work of breathing. GI: Abdomen is soft, non tender, non distended, no abdominal masses. Liver  and spleen not palpable.  No hernias appreciated.  Stool sample for occult testing is not indicated.   GU: No CVA tenderness.  No bladder fullness or masses.  Patient with uncircumcised phallus.  Foreskin easily retracted  Urethral meatus is patent.  No penile discharge. No penile lesions or rashes. Scrotum without lesions, cysts, rashes and/or edema.  Testicles are located scrotally bilaterally. No masses are appreciated in the testicles. Left and right epididymis are normal. Rectal: Patient with  normal sphincter tone. Anus and perineum without scarring or rashes. No rectal masses are appreciated. Prostate is approximately 55 grams, DRE limited to patient's body habitus, no nodules are appreciated. Seminal vesicles are normal. Skin: No rashes, bruises or suspicious lesions. Lymph: No cervical or inguinal adenopathy. Neurologic: Grossly intact, no focal deficits, moving all 4 extremities. Psychiatric: Normal mood and affect.  Laboratory Data: Lab Results  Component Value Date   WBC 5.5 12/28/2012   HGB 14.1 12/28/2012   HCT 42.7 12/28/2012   MCV 81.6 12/28/2012   PLT 152 12/28/2012    Lab Results  Component Value Date   CREATININE 1.03  05/23/2017   Component     Latest Ref Rng & Units 08/13/2016  AFP Tumor Marker     0.0 - 8.3 ng/mL 4.1   Component     Latest Ref Rng & Units 08/13/2016  LDH     121 - 224 IU/L 141   Component     Latest Ref Rng & Units 08/13/2016  hCG Quant     0 - 3 mIU/mL <1   Lab Results  Component Value Date   HGBA1C 8.1 05/23/2017    Component     Latest Ref Rng & Units 05/08/2015 10/11/2015 04/03/2016 04/10/2016  Prostate Specific Ag, Serum     0.0 - 4.0 ng/mL 2.6 2.5 4.4 (H) 4.0   Component     Latest Ref Rng & Units 10/11/2016 02/04/2017 05/23/2017  Prostate Specific Ag, Serum     0.0 - 4.0 ng/mL 6.2 (H) 3.7 3.4   Component     Latest Ref Rng & Units 12/22/2017  Prostate Specific Ag, Serum     0.0 - 4.0 ng/mL 3.3   Pertinent Imaging: No new imaging since surgery  Assessment & Plan:    1. Discolored semen  - reassured patient that it is likely due to dehydration and he will need to increase water consumption   - also explained that as he advances in age and with the finasteride - he may continue to see a more concentrated semen  - he also stated it has been a long time in between emissions and this could also cause discolored semen   - STD's tests are negative  2. Testicular mass  4 mm left hypoechoic mass within the testicle.  Tumor markers previously negative. Renal ultrasound continues to remain stable, increasing weight interval for surveillance Recommend continuing to follow this lesion --> scrotal ultrasound at 1 year interval 7/19 Continue to encourage monthly self testicular exams  3. BPH with LUTS Stable on finasteride  4. Elevated PSA  Fluctuation in PSA likely related to finasteride effect, will continue to monitor Repeat PSA/DRE in 05/2018  5. ED  - refill given for sildenafil   Return for keep appointment in July 2019.   Zara Council, [facility] [address] [place], [place] [address] [phone]

## 2017-12-24 ENCOUNTER — Telehealth: Payer: Self-pay | Admitting: Urology

## 2017-12-24 ENCOUNTER — Ambulatory Visit: Payer: 59 | Admitting: Urology

## 2017-12-24 ENCOUNTER — Encounter: Payer: Self-pay | Admitting: Urology

## 2017-12-24 VITALS — BP 120/86 | HR 102 | Resp 15 | Ht 69.0 in | Wt 208.0 lb

## 2017-12-24 DIAGNOSIS — R868 Other abnormal findings in specimens from male genital organs: Secondary | ICD-10-CM | POA: Diagnosis not present

## 2017-12-24 DIAGNOSIS — N529 Male erectile dysfunction, unspecified: Secondary | ICD-10-CM

## 2017-12-24 DIAGNOSIS — N138 Other obstructive and reflux uropathy: Secondary | ICD-10-CM

## 2017-12-24 DIAGNOSIS — D4959 Neoplasm of unspecified behavior of other genitourinary organ: Secondary | ICD-10-CM

## 2017-12-24 DIAGNOSIS — N401 Enlarged prostate with lower urinary tract symptoms: Secondary | ICD-10-CM

## 2017-12-24 MED ORDER — SILDENAFIL CITRATE 20 MG PO TABS: ORAL_TABLET | ORAL | 3 refills | Status: DC

## 2017-12-24 NOTE — Telephone Encounter (Signed)
Would you make sure his scrotal ultrasound is scheduled for July?

## 2017-12-25 IMAGING — US US SCROTUM
1 series · 13 of 25 positions shown · non-contrast
Comparison: Testicular ultrasound August 12, 2016

CLINICAL DATA: Right-sided hydrocele.  Left-sided scrotal mass.

EXAM:
SCROTAL ULTRASOUND
DOPPLER ULTRASOUND OF THE TESTICLES
TECHNIQUE: Complete ultrasound examination of the testicles, epididymis, and
other scrotal structures was performed. Color and spectral Doppler
ultrasound were also utilized to evaluate blood flow to the
testicles.

[Series 1: us scrotum · 0.07mm/px · 13 of 72 slices shown]
[im 1/72]
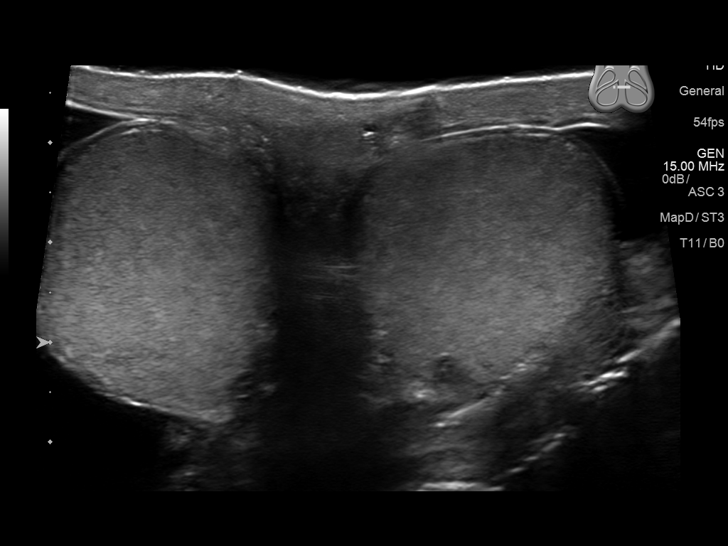
[im 6/72]
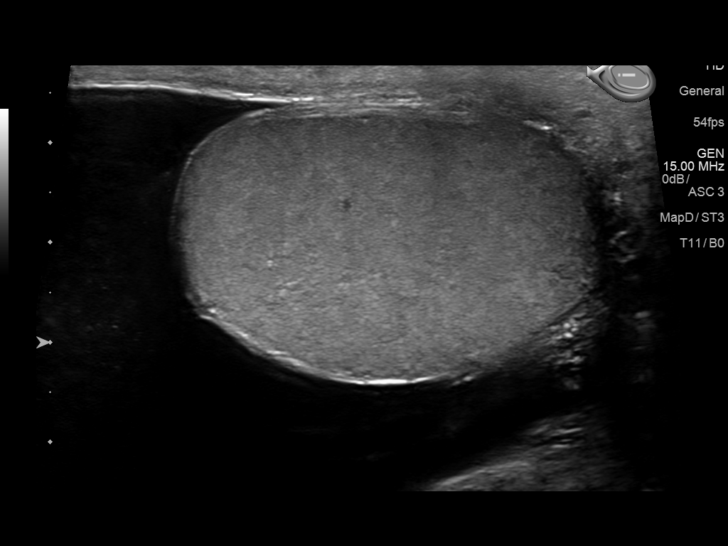
[im 12/72]
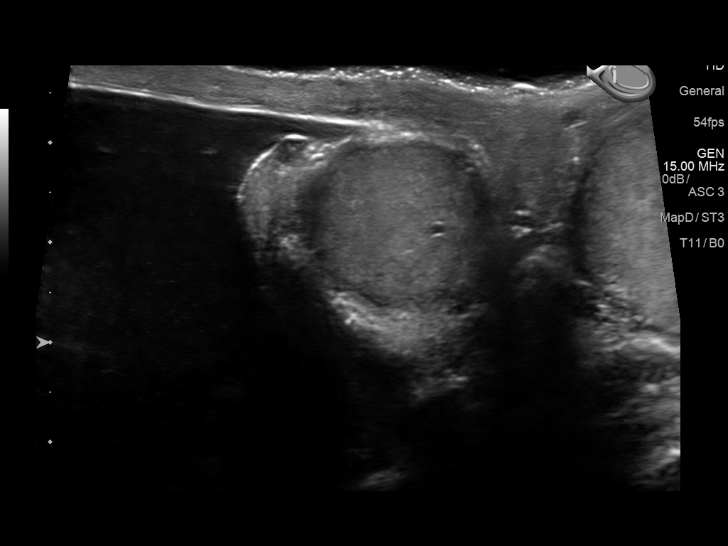
[im 18/72]
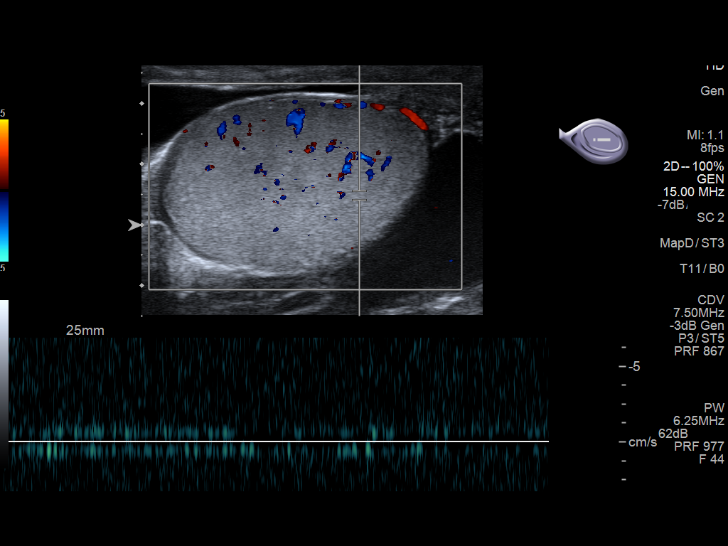
[im 24/72]
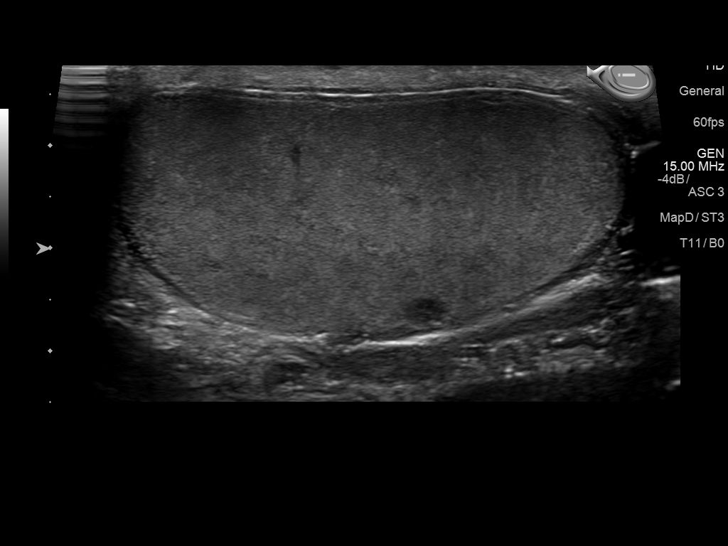
[im 30/72]
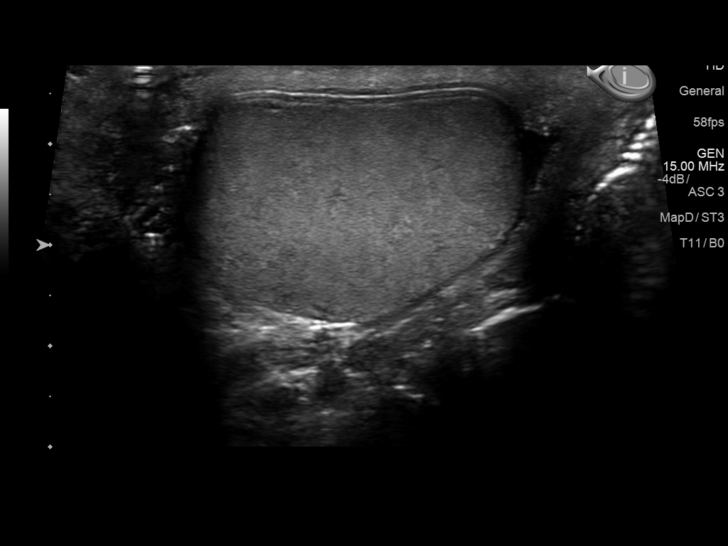
[im 36/72]
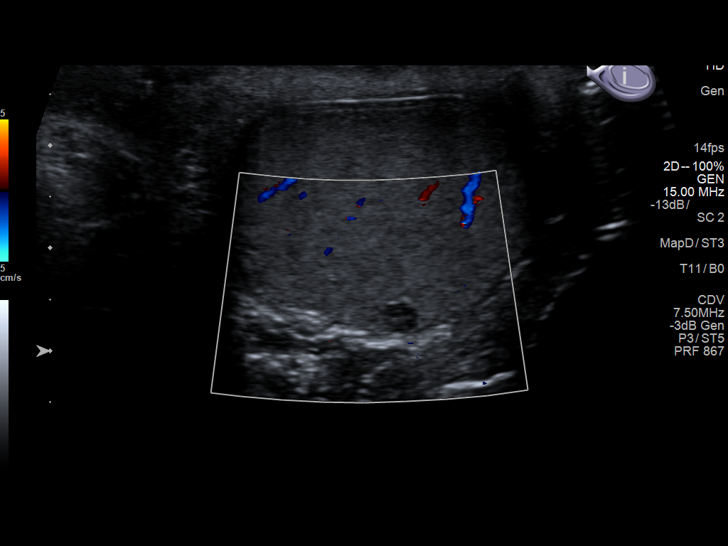
[im 42/72]
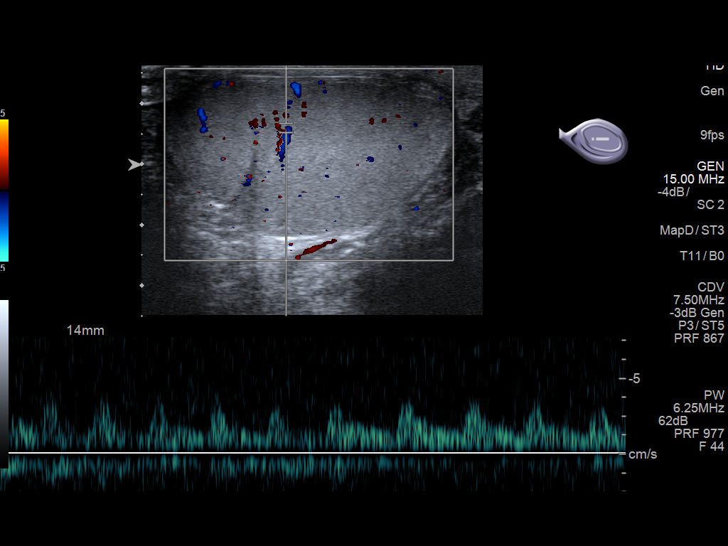
[im 48/72]
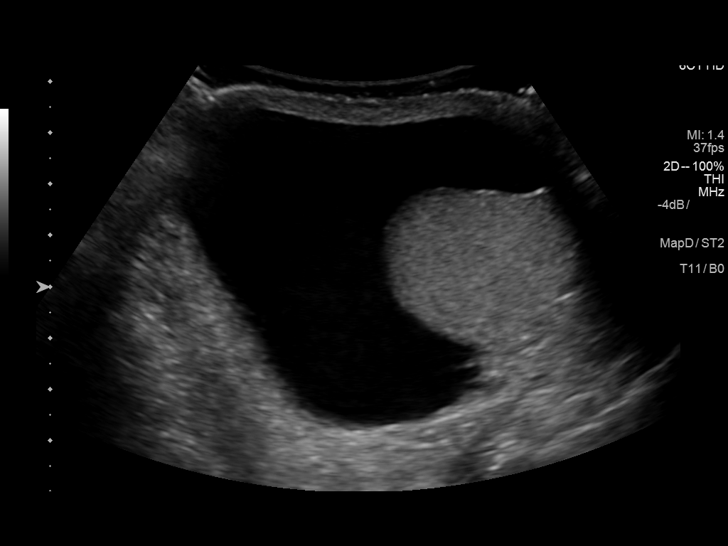
[im 54/72]
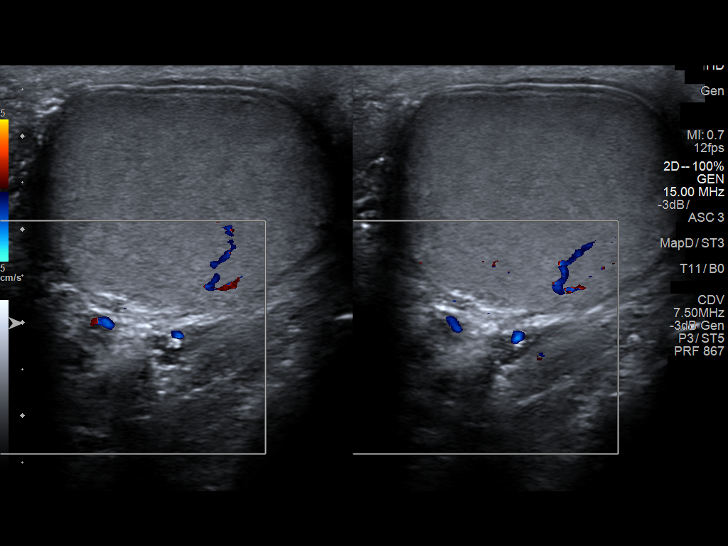
[im 60/72]
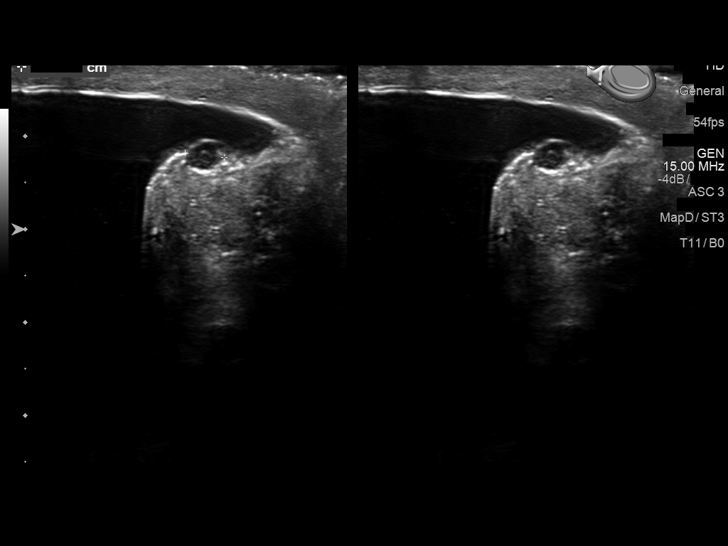
[im 66/72]
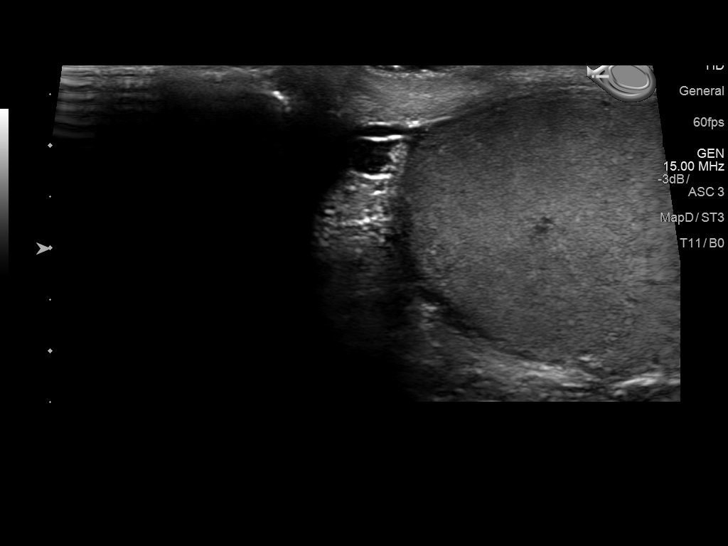
[im 72/72]
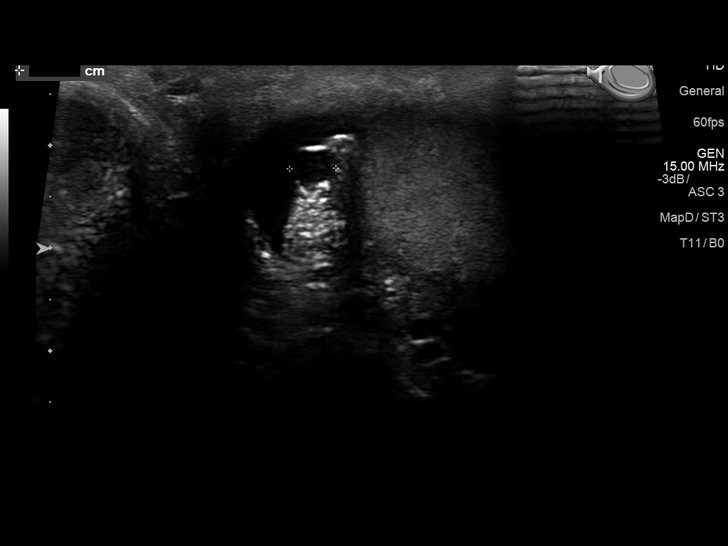

[13 of 25 positions shown; findings below may reference images not displayed]

FINDINGS: Right testicle

Measurements: 4.6 x 3.0 x 3.3 cm.  The echotexture is normal.

Left testicle

Measurements: 4.9 x 2.6 x 2.8 cm.. There is a persistent hypoechoic
mass in the subcapsular region of the left testicle. It measures 5 x
4 x 4 mm. On Doppler interrogation it is not hypervascular.

Right epididymis: There is a tiny epididymal cyst measuring 3 x 5 x
4 mm.

Left epididymis: There is a tiny epididymal cyst measuring 4 x 4 x 6
mm

Hydrocele: The large right-sided hydrocele is again demonstrated.
There is no left-sided hydrocele.

Varicocele:  None visualized.

Pulsed Doppler interrogation of both testes demonstrates normal low
resistance arterial and venous waveforms bilaterally.
IMPRESSION: Persistent large right-sided hydrocele.

Stable sub cm hypoechoic nonvascular solid mass in the left
testicle. This may reflect an epidermoid cyst but a testicular
neoplasm could produce a similar appearance. MRI of the testes is
recommended.

Stable bilateral subcentimeter epididymal cysts.

## 2017-12-27 ENCOUNTER — Other Ambulatory Visit: Payer: Self-pay

## 2017-12-27 DIAGNOSIS — R0982 Postnasal drip: Secondary | ICD-10-CM

## 2017-12-27 DIAGNOSIS — R059 Cough, unspecified: Secondary | ICD-10-CM

## 2017-12-27 DIAGNOSIS — R05 Cough: Secondary | ICD-10-CM

## 2017-12-27 MED ORDER — FAMOTIDINE 20 MG PO TABS: 20.0000 mg | ORAL_TABLET | Freq: Two times a day (BID) | ORAL | 1 refills | Status: DC

## 2017-12-27 MED ORDER — FLUTICASONE PROPIONATE 50 MCG/ACT NA SUSP: 2.0000 | Freq: Every day | NASAL | 6 refills | Status: DC

## 2017-12-27 MED ORDER — CETIRIZINE HCL 10 MG PO TABS: 10.0000 mg | ORAL_TABLET | Freq: Every day | ORAL | 11 refills | Status: DC

## 2017-12-31 DIAGNOSIS — E119 Type 2 diabetes mellitus without complications: Secondary | ICD-10-CM | POA: Diagnosis not present

## 2017-12-31 DIAGNOSIS — H04123 Dry eye syndrome of bilateral lacrimal glands: Secondary | ICD-10-CM | POA: Diagnosis not present

## 2017-12-31 DIAGNOSIS — H2513 Age-related nuclear cataract, bilateral: Secondary | ICD-10-CM | POA: Diagnosis not present

## 2017-12-31 LAB — HM DIABETES EYE EXAM

## 2018-01-21 ENCOUNTER — Ambulatory Visit: Payer: 59 | Admitting: Emergency Medicine

## 2018-01-22 ENCOUNTER — Ambulatory Visit: Payer: 59 | Admitting: Physician Assistant

## 2018-01-22 ENCOUNTER — Encounter: Payer: Self-pay | Admitting: Physician Assistant

## 2018-01-22 VITALS — BP 136/84 | HR 97 | Temp 98.3°F | Resp 14 | Ht 69.0 in | Wt 287.0 lb

## 2018-01-22 DIAGNOSIS — M7989 Other specified soft tissue disorders: Secondary | ICD-10-CM

## 2018-01-22 LAB — POCT CBC
Granulocyte percent: 76.8 %G (ref 37–80)
HCT, POC: 44 % (ref 43.5–53.7)
Hemoglobin: 13.8 g/dL — AB (ref 14.1–18.1)
Lymph, poc: 1.6 (ref 0.6–3.4)
MCH, POC: 26.9 pg — AB (ref 27–31.2)
MCHC: 31.3 g/dL — AB (ref 31.8–35.4)
MCV: 85.7 fL (ref 80–97)
MID (cbc): 0.3 (ref 0–0.9)
MPV: 9.8 fL (ref 0–99.8)
POC Granulocyte: 6.1 (ref 2–6.9)
POC LYMPH PERCENT: 19.8 %L (ref 10–50)
POC MID %: 3.4 %M (ref 0–12)
Platelet Count, POC: 119 10*3/uL — AB (ref 142–424)
RBC: 5.13 M/uL (ref 4.69–6.13)
RDW, POC: 14.1 %
WBC: 8 10*3/uL (ref 4.6–10.2)

## 2018-01-22 LAB — POCT URINALYSIS DIP (MANUAL ENTRY)
Blood, UA: NEGATIVE
Glucose, UA: NEGATIVE mg/dL
Leukocytes, UA: NEGATIVE
Nitrite, UA: NEGATIVE
Protein Ur, POC: 100 mg/dL — AB
Spec Grav, UA: >=1.03 — AB (ref 1.010–1.025)
Urobilinogen, UA: 1 E.U./dL
pH, UA: 5 (ref 5.0–8.0)

## 2018-01-22 MED ORDER — TERBINAFINE HCL 250 MG PO TABS: 250.0000 mg | ORAL_TABLET | Freq: Every day | ORAL | 0 refills | Status: DC

## 2018-01-22 MED ORDER — CHLORTHALIDONE 25 MG PO TABS: 25.0000 mg | ORAL_TABLET | Freq: Every day | ORAL | 3 refills | Status: DC

## 2018-01-22 MED ORDER — TRAMADOL HCL 50 MG PO TABS: 50.0000 mg | ORAL_TABLET | Freq: Three times a day (TID) | ORAL | 0 refills | Status: DC | PRN

## 2018-01-22 MED ORDER — POTASSIUM CHLORIDE ER 10 MEQ PO TBCR: 10.0000 meq | EXTENDED_RELEASE_TABLET | Freq: Every day | ORAL | 3 refills | Status: DC

## 2018-01-22 NOTE — Progress Notes (Signed)
01/22/2018 2:20 PM   DOB: 1956/01/23 / MRN: [id number]  SUBJECTIVE:  Johnny Maddox is a 62 y.o. male presenting for bilateral foot swelling present for about 4 days.  No history of gout.  History of DM2 with modest control.   He had an appointment yesterday and was 7 minutes late and was turned away.    He is allergic to other and seasonal ic [cholestatin].   He  has a past medical history of Acute laryngitis, without mention of obstruction, Allergic rhinitis, cause unspecified, Chalazion, Cough, Dermatophytosis of foot, Diabetes mellitus without complication ([place]), Elevated PSA, Hyperlipidemia, Hypertension, Microalbuminuria, Overweight, Seborrheic dermatitis, unspecified, Sleep apnea, Unspecified disorder of autonomic nervous system, Unspecified sleep apnea, Unspecified venous (peripheral) insufficiency, and Unspecified vitamin D deficiency.    He  reports that he has quit smoking. he has never used smokeless tobacco. He reports that he drinks about 1.2 oz of alcohol per week. He reports that he does not use drugs. He  reports that he currently engages in sexual activity and has had partners who are Male. He reports using the following method of birth control/protection: Condom. The patient  has a past surgical history that includes Rotator cuff repair (R 2011, L 2006); Tonsillectomy and adenoidectomy; Circumcision; and Hydrocele surgery (Right, 09/23/2016).  His family history includes Alcohol abuse in his father; Cancer in his mother; Hypertension in his brother and mother.  ROS  The problem list and medications were reviewed and updated by myself where necessary and exist elsewhere in the encounter.   OBJECTIVE:  BP 136/84   Pulse 97   Temp 98.3 F (36.8 C) (Oral)   Resp 14   Ht 5\' 9"  (1.753 m)   Wt 287 lb (130.2 kg)   SpO2 98%   BMI 42.38 kg/m   Lab Results  Component Value Date   HGBA1C 8.1 05/23/2017   Lab Results  Component Value Date   CREATININE 1.03 05/23/2017   BUN 12 05/23/2017   NA 140 05/23/2017   K 4.2 05/23/2017   CL 105 05/23/2017   CO2 21 05/23/2017   Lab Results  Component Value Date   ALT 35 05/23/2017   AST 22 05/23/2017   ALKPHOS 47 05/23/2017   BILITOT 0.3 05/23/2017      Physical Exam  Constitutional: He appears well-developed. He is active and cooperative.  Non-toxic appearance.  Cardiovascular: Normal rate.  Pulmonary/Chest: Effort normal. No tachypnea.  Musculoskeletal:  Nonspecific swelling about the bilateral feet and into the lower legs.  Negative for overt tenderness.  Onychomycosis and tinea pedis present about the bilateral feet.  Neurological: He is alert.  Skin: Skin is warm and dry. He is not diaphoretic. No pallor.  Vitals reviewed.   Results for orders placed or performed in visit on 01/22/18 (from the past 72 hour(s))  POCT CBC     Status: Abnormal   Collection Time: 01/22/18  2:18 PM  Result Value Ref Range   WBC 8.0 4.6 - 10.2 K/uL   Lymph, poc 1.6 0.6 - 3.4   POC LYMPH PERCENT 19.8 10 - 50 %L   MID (cbc) 0.3 0 - 0.9   POC MID % 3.4 0 - 12 %M   POC Granulocyte 6.1 2 - 6.9   Granulocyte percent 76.8 37 - 80 %G   RBC 5.13 4.69 - 6.13 M/uL   Hemoglobin 13.8 (A) 14.1 - 18.1 g/dL   HCT, POC 44.0 43.5 - 53.7 %   MCV 85.7 80 -  97 fL   MCH, POC 26.9 (A) 27 - 31.2 pg   MCHC 31.3 (A) 31.8 - 35.4 g/dL   RDW, POC 14.1 %   Platelet Count, POC 119 (A) 142 - 424 K/uL   MPV 9.8 0 - 99.8 fL   CBC Latest Ref Rng & Units 01/22/2018 12/28/2012  WBC 4.6 - 10.2 K/uL 8.0 5.5  Hemoglobin 14.1 - 18.1 g/dL 13.8(A) 14.1  Hematocrit 43.5 - 53.7 % 44.0 42.7  Platelets 150 - 400 K/uL - 152    No results found.  ASSESSMENT AND PLAN:  Johnny Maddox was seen today for foot swelling and medication reaction.  Diagnoses and all orders for this visit:  Bilateral swelling of feet: Doubt cardiovascular.  He has negative from 2014 that shows a normal ejection fraction.  Possibly renal given history of diabetes however his last  creatinine was normal.  He tells me that the lisinopril is causing him to cough.  I am not started on chlorthalidone to reduce the swelling in his feet and I will treat the pain component with tramadol for now.  I am running a gout lab however I think this is probably doubtful given a normal CBC and is not really in a lot of pain today.  Starting on a low dose of potassium to go with the chlorthalidone.  If he does well his pressure is not too low I think losartan could easily be added to him at a follow-up visit for kidney protection.  He does have tinea pedis and onychomycosis about the bilateral feet.  This is the perfect set up for cellulitis.  Fortunately I do not think there is any acute bacterial infection today however I think he would benefit from treating the fungal component.  I am sending in some Lamisil 250 daily for 12 weeks.  He has a healthy liver. -     Uric Acid -     POCT CBC -     POCT urinalysis dipstick -     Renal Function Panel    The patient is advised to call or return to clinic if he does not see an improvement in symptoms, or to seek the care of the closest emergency department if he worsens with the above plan.   Philis Fendt, MHS, PA-C [facility] at [facility] 01/22/2018 2:20 PM

## 2018-01-22 NOTE — Patient Instructions (Signed)
     IF you received an x-ray today, you will receive an invoice from [facility]. Please contact [facility] at [phone] with questions or concerns regarding your invoice.   IF you received labwork today, you will receive an invoice from [facility]. Please contact [facility] at [phone] with questions or concerns regarding your invoice.   Our billing staff will not be able to assist you with questions regarding bills from these companies.  You will be contacted with the lab results as soon as they are available. The fastest way to get your results is to activate your My Chart account. Instructions are located on the last page of this paperwork. If you have not heard from us regarding the results in 2 weeks, please contact this office.     

## 2018-01-23 LAB — URIC ACID: Uric Acid: 4.5 mg/dL (ref 3.7–8.6)

## 2018-01-23 LAB — RENAL FUNCTION PANEL
Albumin: 4.5 g/dL (ref 3.6–4.8)
BUN/Creatinine Ratio: 13 (ref 10–24)
BUN: 15 mg/dL (ref 8–27)
CO2: 21 mmol/L (ref 20–29)
Calcium: 9.9 mg/dL (ref 8.6–10.2)
Chloride: 103 mmol/L (ref 96–106)
Creatinine, Ser: 1.14 mg/dL (ref 0.76–1.27)
GFR calc Af Amer: 80 mL/min/{1.73_m2} (ref >59)
GFR calc non Af Amer: 69 mL/min/{1.73_m2} (ref >59)
Glucose: 134 mg/dL — ABNORMAL HIGH (ref 65–99)
Phosphorus: 3.6 mg/dL (ref 2.5–4.5)
Potassium: 4.1 mmol/L (ref 3.5–5.2)
Sodium: 141 mmol/L (ref 134–144)

## 2018-01-28 ENCOUNTER — Encounter: Payer: Self-pay | Admitting: Physician Assistant

## 2018-01-28 ENCOUNTER — Ambulatory Visit: Payer: 59 | Admitting: Physician Assistant

## 2018-01-28 VITALS — BP 132/68 | HR 117 | Temp 99.0°F | Resp 18 | Ht 69.0 in | Wt 278.6 lb

## 2018-01-28 DIAGNOSIS — L089 Local infection of the skin and subcutaneous tissue, unspecified: Secondary | ICD-10-CM

## 2018-01-28 DIAGNOSIS — I96 Gangrene, not elsewhere classified: Secondary | ICD-10-CM

## 2018-01-28 LAB — BASIC METABOLIC PANEL
BUN/Creatinine Ratio: 15 (ref 10–24)
BUN: 17 mg/dL (ref 8–27)
CO2: 21 mmol/L (ref 20–29)
Calcium: 10.3 mg/dL — ABNORMAL HIGH (ref 8.6–10.2)
Chloride: 102 mmol/L (ref 96–106)
Creatinine, Ser: 1.1 mg/dL (ref 0.76–1.27)
GFR calc Af Amer: 83 mL/min/{1.73_m2} (ref >59)
GFR calc non Af Amer: 72 mL/min/{1.73_m2} (ref >59)
Glucose: 83 mg/dL (ref 65–99)
Potassium: 4.3 mmol/L (ref 3.5–5.2)
Sodium: 140 mmol/L (ref 134–144)

## 2018-01-28 LAB — POCT CBC
Granulocyte percent: 70.8 %G (ref 37–80)
HCT, POC: 44.6 % (ref 43.5–53.7)
Hemoglobin: 14.5 g/dL (ref 14.1–18.1)
Lymph, poc: 1.6 (ref 0.6–3.4)
MCH, POC: 27.5 pg (ref 27–31.2)
MCHC: 32.6 g/dL (ref 31.8–35.4)
MCV: 84.5 fL (ref 80–97)
MID (cbc): 0.7 (ref 0–0.9)
MPV: 9.3 fL (ref 0–99.8)
POC Granulocyte: 5.7 (ref 2–6.9)
POC LYMPH PERCENT: 20.5 %L (ref 10–50)
POC MID %: 8.7 %M (ref 0–12)
Platelet Count, POC: 235 10*3/uL (ref 142–424)
RBC: 5.28 M/uL (ref 4.69–6.13)
RDW, POC: 13.7 %
WBC: 8 10*3/uL (ref 4.6–10.2)

## 2018-01-28 LAB — GLUCOSE, POCT (MANUAL RESULT ENTRY): POC Glucose: 85 mg/dl (ref 70–99)

## 2018-01-28 MED ORDER — CEPHALEXIN 500 MG PO CAPS: 500.0000 mg | ORAL_CAPSULE | Freq: Two times a day (BID) | ORAL | 0 refills | Status: DC

## 2018-01-28 NOTE — Patient Instructions (Addendum)
  Take 1/2 tab of chlorthalidone.  Hold the terbinafine for now.    IF you received an x-ray today, you will receive an invoice from [facility]. Please contact [facility] at [phone] with questions or concerns regarding your invoice.   IF you received labwork today, you will receive an invoice from [facility]. Please contact [facility] at [phone] with questions or concerns regarding your invoice.   Our billing staff will not be able to assist you with questions regarding bills from these companies.  You will be contacted with the lab results as soon as they are available. The fastest way to get your results is to activate your My Chart account. Instructions are located on the last page of this paperwork. If you have not heard from [place] regarding the results in 2 weeks, please contact this office.

## 2018-01-28 NOTE — Progress Notes (Signed)
01/28/2018 12:20 PM   DOB: 10/26/1956 / MRN: [id number]  SUBJECTIVE:  Johnny Maddox is a 62 y.o. male presenting for bilateral foot pain and skin sloughing.  Patient states that over the last 6 days, since starting the chlorthalidone, he has lost about 10 pounds.  He noticed about 4 days ago that the skin on his feet was becoming very dry and wrinkled.  He he then began to notice that his skin started to slough off.  He complains of pain around the area of the new skin along with redness.  He feels the leg swelling is much better at this time.  He is allergic to other and seasonal ic [cholestatin].   He  has a past medical history of Acute laryngitis, without mention of obstruction, Allergic rhinitis, cause unspecified, Chalazion, Cough, Dermatophytosis of foot, Diabetes mellitus without complication ([place]), Elevated PSA, Hyperlipidemia, Hypertension, Microalbuminuria, Overweight, Seborrheic dermatitis, unspecified, Sleep apnea, Unspecified disorder of autonomic nervous system, Unspecified sleep apnea, Unspecified venous (peripheral) insufficiency, and Unspecified vitamin D deficiency.    He  reports that he has quit smoking. he has never used smokeless tobacco. He reports that he drinks about 1.2 oz of alcohol per week. He reports that he does not use drugs. He  reports that he currently engages in sexual activity and has had partners who are Male. He reports using the following method of birth control/protection: Condom. The patient  has a past surgical history that includes Rotator cuff repair (R 2011, L 2006); Tonsillectomy and adenoidectomy; Circumcision; and Hydrocele surgery (Right, 09/23/2016).  His family history includes Alcohol abuse in his father; Cancer in his mother; Hypertension in his brother and mother.  Review of Systems  Constitutional: Negative for chills and fever.  Cardiovascular: Negative for chest pain and leg swelling.  Gastrointestinal: Negative for nausea.  Skin:  Positive for rash. Negative for itching.  Neurological: Negative for dizziness.    The problem list and medications were reviewed and updated by myself where necessary and exist elsewhere in the encounter.   OBJECTIVE:  BP 132/68 (BP Location: Left Arm, Patient Position: Sitting, Cuff Size: Large)   Pulse (!) 117   Temp 99 F (37.2 C) (Oral)   Resp 18   Ht 5\' 9"  (1.753 m)   Wt 278 lb 9.6 oz (126.4 kg)   SpO2 98%   BMI 41.14 kg/m   Pulse Readings from Last 3 Encounters:  01/28/18 (!) 117  01/22/18 97  12/24/17 (!) 102   BP Readings from Last 3 Encounters:  01/28/18 132/68  01/22/18 136/84  12/24/17 120/86    Wt Readings from Last 3 Encounters:  01/28/18 278 lb 9.6 oz (126.4 kg)  01/22/18 287 lb (130.2 kg)  12/24/17 208 lb (94.3 kg)      Physical Exam  Constitutional: He appears well-developed. He is active.  Non-toxic appearance.  Cardiovascular: S1 normal, S2 normal and normal pulses.  Pulmonary/Chest: He has no rales.  Abdominal: He exhibits no distension.  Musculoskeletal: He exhibits no edema.  Skin: Skin is warm and dry. Rash (There is a large amount of xerotic skin peeling from the dorsal aspects of the bilateral feet.  There is no longer any swelling in his legs.) noted. He is not diaphoretic. No pallor.   Lab Results  Component Value Date   CREATININE 1.14 01/22/2018   BUN 15 01/22/2018   NA 141 01/22/2018   K 4.1 01/22/2018   CL 103 01/22/2018   CO2 21 01/22/2018  Results for orders placed or performed in visit on 01/28/18 (from the past 72 hour(s))  POCT glucose (manual entry)     Status: None   Collection Time: 01/28/18 12:19 PM  Result Value Ref Range   POC Glucose 85 70 - 99 mg/dl   Lab Results  Component Value Date   HGBA1C 8.1 05/23/2017     No results found.  ASSESSMENT AND PLAN:  Lula was seen today for foot pain.  Diagnoses and all orders for this visit:  Skin sloughing: I was initially very concerned upon seeing his  feet however the patient is not having any conjunctivitis or oral rash.  He is lost a significant amount of weight since starting the chlorthalidone.  Despite a normal BMET I think I diuresed him too quickly and this is causing the sloughing of his skin.  His CBC and other lab studies do not indicate a systemic reaction.  Nevertheless I am going to hold his Lamisil for now and I am going to half his chlorthalidone.  I have dressed his wounds with Silvadene and nonadhesive Telfa and have advised him to leave his feet alone until Friday at which point I will see him back.  He does have a history of albuminuria and thus would benefit from an ARB (ACE inhibitor made him cough).   -     POCT CBC -     Basic Metabolic Panel -     POCT glucose (manual entry)    The patient is advised to call or return to clinic if he does not see an improvement in symptoms, or to seek the care of the closest emergency department if he worsens with the above plan.   Philis Fendt, MHS, PA-C [facility] 01/28/2018 12:20 PM

## 2018-01-30 ENCOUNTER — Ambulatory Visit: Payer: 59 | Admitting: Physician Assistant

## 2018-01-30 VITALS — BP 115/81 | HR 107 | Temp 99.0°F | Resp 16 | Ht 69.0 in | Wt 278.0 lb

## 2018-01-30 DIAGNOSIS — L089 Local infection of the skin and subcutaneous tissue, unspecified: Secondary | ICD-10-CM

## 2018-01-30 DIAGNOSIS — I96 Gangrene, not elsewhere classified: Secondary | ICD-10-CM

## 2018-01-30 NOTE — Patient Instructions (Signed)
     IF you received an x-ray today, you will receive an invoice from [facility]. Please contact [facility] at [phone] with questions or concerns regarding your invoice.   IF you received labwork today, you will receive an invoice from [facility]. Please contact [facility] at [phone] with questions or concerns regarding your invoice.   Our billing staff will not be able to assist you with questions regarding bills from these companies.  You will be contacted with the lab results as soon as they are available. The fastest way to get your results is to activate your My Chart account. Instructions are located on the last page of this paperwork. If you have not heard from us regarding the results in 2 weeks, please contact this office.     

## 2018-01-30 NOTE — Progress Notes (Signed)
01/30/2018 2:44 PM   DOB: [birth year] / MRN: [id number]  SUBJECTIVE:  Johnny Maddox is a 62 y.o. male presenting for recheck of skin sloughing.  Patient feels well and is having less feet pain today.  He stopped taking the Lamisil.  He is taking half a dose of the chlorthalidone at 12.5 daily.  Denies fever, chills, nausea.  He is taking Keflex as prescribed.  He is allergic to other and seasonal ic [cholestatin].   He  has a past medical history of Acute laryngitis, without mention of obstruction, Allergic rhinitis, cause unspecified, Chalazion, Cough, Dermatophytosis of foot, Diabetes mellitus without complication ([place]), Elevated PSA, Hyperlipidemia, Hypertension, Microalbuminuria, Overweight, Seborrheic dermatitis, unspecified, Sleep apnea, Unspecified disorder of autonomic nervous system, Unspecified sleep apnea, Unspecified venous (peripheral) insufficiency, and Unspecified vitamin D deficiency.    He  reports that he has quit smoking. He has never used smokeless tobacco. He reports that he drinks about 1.2 oz of alcohol per week. He reports that he does not use drugs. He  reports that he currently engages in sexual activity and has had partners who are Male. He reports using the following method of birth control/protection: Condom. The patient  has a past surgical history that includes Rotator cuff repair (R 2011, L 2006); Tonsillectomy and adenoidectomy; Circumcision; and Hydrocele surgery (Right, 09/23/2016).  His family history includes Alcohol abuse in his father; Cancer in his mother; Hypertension in his brother and mother.  Review of Systems  Constitutional: Negative for chills, diaphoresis and fever.  Respiratory: Negative for cough, hemoptysis, sputum production, shortness of breath and wheezing.   Cardiovascular: Negative for chest pain, orthopnea and leg swelling.  Gastrointestinal: Negative for nausea.  Skin: Negative for rash.  Neurological: Negative for dizziness.    The  problem list and medications were reviewed and updated by myself where necessary and exist elsewhere in the encounter.   OBJECTIVE:  BP 115/81   Pulse (!) 107   Temp 99 F (37.2 C) (Oral)   Resp 16   Ht 5\' 9"  (1.753 m)   Wt 278 lb (126.1 kg)   BMI 41.05 kg/m   Physical Exam  Constitutional: He appears well-developed. He is active and cooperative.  Non-toxic appearance.  Cardiovascular: Normal rate.  Pulmonary/Chest: Effort normal. No tachypnea.  Neurological: He is alert.  Skin: Skin is warm and dry. He is not diaphoretic. No pallor.  Vitals reviewed.   Wt Readings from Last 3 Encounters:  01/30/18 278 lb (126.1 kg)  01/28/18 278 lb 9.6 oz (126.4 kg)  01/22/18 287 lb (130.2 kg)     Results for orders placed or performed in visit on 01/28/18 (from the past 72 hour(s))  POCT glucose (manual entry)     Status: None   Collection Time: 01/28/18 12:19 PM  Result Value Ref Range   POC Glucose 85 70 - 99 mg/dl  POCT CBC     Status: None   Collection Time: 01/28/18 12:21 PM  Result Value Ref Range   WBC 8.0 4.6 - 10.2 K/uL   Lymph, poc 1.6 0.6 - 3.4   POC LYMPH PERCENT 20.5 10 - 50 %L   MID (cbc) 0.7 0 - 0.9   POC MID % 8.7 0 - 12 %M   POC Granulocyte 5.7 2 - 6.9   Granulocyte percent 70.8 37 - 80 %G   RBC 5.28 4.69 - 6.13 M/uL   Hemoglobin 14.5 14.1 - 18.1 g/dL   HCT, POC 44.6 43.5 - 53.7 %  MCV 84.5 80 - 97 fL   MCH, POC 27.5 27 - 31.2 pg   MCHC 32.6 31.8 - 35.4 g/dL   RDW, POC 13.7 %   Platelet Count, POC 235 142 - 424 K/uL   MPV 9.3 0 - 99.8 fL  Basic Metabolic Panel     Status: Abnormal   Collection Time: 01/28/18 12:47 PM  Result Value Ref Range   Glucose 83 65 - 99 mg/dL   BUN 17 8 - 27 mg/dL   Creatinine, Ser 1.10 0.76 - 1.27 mg/dL   GFR calc non Af Amer 72 >59 mL/min/1.73   GFR calc Af Amer 83 >59 mL/min/1.73   BUN/Creatinine Ratio 15 10 - 24   Sodium 140 134 - 144 mmol/L   Potassium 4.3 3.5 - 5.2 mmol/L   Chloride 102 96 - 106 mmol/L   CO2 21 20 -  29 mmol/L   Calcium 10.3 (H) 8.6 - 10.2 mg/dL    No results found.  ASSESSMENT AND PLAN:  Johnny Maddox was seen today for skin sloughing.  Diagnoses and all orders for this visit:  Skin sloughing Comments: He is not worsening.  I will see him back on Monday and have advised that he soak his feet in warm water and Epsom salt often.  I do not think this was secondary to Lamisil.    The patient is advised to call or return to clinic if he does not see an improvement in symptoms, or to seek the care of the closest emergency department if he worsens with the above plan.   Philis Fendt, MHS, PA-C [facility] 01/30/2018 2:44 PM

## 2018-02-02 ENCOUNTER — Ambulatory Visit: Payer: 59 | Admitting: Physician Assistant

## 2018-02-02 ENCOUNTER — Other Ambulatory Visit: Payer: Self-pay | Admitting: Physician Assistant

## 2018-02-02 NOTE — Telephone Encounter (Signed)
LOV: 05/23/18 with Dr. Nolon Rod  Dr. Nolon Rod  [facility] in [place]  Johnny Maddox

## 2018-02-05 ENCOUNTER — Encounter: Payer: Self-pay | Admitting: Podiatry

## 2018-02-05 ENCOUNTER — Ambulatory Visit: Payer: 59 | Admitting: Podiatry

## 2018-02-05 VITALS — BP 139/96 | HR 105

## 2018-02-05 DIAGNOSIS — E119 Type 2 diabetes mellitus without complications: Secondary | ICD-10-CM

## 2018-02-05 DIAGNOSIS — L308 Other specified dermatitis: Secondary | ICD-10-CM

## 2018-02-05 DIAGNOSIS — L309 Dermatitis, unspecified: Secondary | ICD-10-CM | POA: Diagnosis not present

## 2018-02-05 DIAGNOSIS — T50905A Adverse effect of unspecified drugs, medicaments and biological substances, initial encounter: Secondary | ICD-10-CM

## 2018-02-05 DIAGNOSIS — L538 Other specified erythematous conditions: Secondary | ICD-10-CM

## 2018-02-05 NOTE — Progress Notes (Signed)
This patient presents the office with chief complaint of pain and peeling of the skin on both the top and the bottom of both feet.  He says this is been occurring for approximately 2 weeks.  He says he was under treatment from Dr. Carlis Abbott for swelling on both feet.  The doctor treated him with water pill to help to reduce the swelling in his feet.  The doctor also prescribed Lamisil since he felt that the patient had a fungal characteristics to his skin. He says that 4 days after starting this medication. He started having dry peeling occurring to the skin of both feet. It was documented at his second visit with Dr. Carlis Abbott that his skin was sloughing.  Dr. Carlis Abbott discontinued the Lamisil and cut the water pill and half. He was seen back by Dr. Carlis Abbott on March 22 and there was no worsening of his present condition.  He presents the office today stating that he is not having his questions answered and presents to this office for an evaluation of his painful and softening skin both feet.  He presents the office for evaluation of this skin dermatitis  General Appearance  Alert, conversant and in no acute stress.  Vascular  Dorsalis pedis and posterior tibial  pulses are palpable  bilaterally.  Capillary return is within normal limits  bilaterally. Temperature is within normal limits  bilaterally.  Neurologic  Senn-Weinstein monofilament wire test within normal limits  bilaterally. Muscle power within normal limits bilaterally.  Nails Thick disfigured discolored nails with subungual debris  from hallux to fifth toes bilaterally. No evidence of bacterial infection or drainage bilaterally.  Orthopedic  No limitations of motion of motion feet .  No crepitus or effusions noted.  No bony pathology or digital deformities noted.  Skin  Significant peeling skin on the dorsum of both feet .  Desquamation noted on the plantar skin both feet.  See pictures          Diagnosis  Desquamation Dermatitis noted feet   B/L.  Dermatitis related to possible reaction to medication.   Treatment  IE  Examination of his feet do reveal healing on the dorsum and desquamation on the plantar aspect of the foot and around the toes.  There is no evidence of any infection or inflammation aside from the skin reaction.  I proceeded to do bride the loose desquamated skin on the foot.  I told the patient that we could possibly treat him with a Medrol Dosepak, but since he is diabetic, I am hesitant.  I recommended that he be seen ASAP by a dermatologist.  He says he called multiple dermatologist and the earliest appointment he could receive was mid April.  I therefore recommended he consider revisiting the emergency department for evaluation and treatment.  If this condition worsens or becomes very painful, the patient was told to contact this office or go to the Emergency Department at the hospital.  Gardiner Barefoot DPM

## 2018-02-06 DIAGNOSIS — R21 Rash and other nonspecific skin eruption: Secondary | ICD-10-CM | POA: Diagnosis not present

## 2018-02-06 DIAGNOSIS — L304 Erythema intertrigo: Secondary | ICD-10-CM | POA: Diagnosis not present

## 2018-02-18 NOTE — Telephone Encounter (Signed)
I am not sure who severe his wounds were. Can you follow up on this tramadol?

## 2018-02-23 ENCOUNTER — Other Ambulatory Visit: Payer: Self-pay | Admitting: Family Medicine

## 2018-02-23 DIAGNOSIS — R059 Cough, unspecified: Secondary | ICD-10-CM

## 2018-02-23 DIAGNOSIS — R05 Cough: Secondary | ICD-10-CM

## 2018-02-23 DIAGNOSIS — R0982 Postnasal drip: Secondary | ICD-10-CM

## 2018-02-23 NOTE — Telephone Encounter (Signed)
Patient called to ask about the refill request for Famotidine, he says I don't take it anymore.

## 2018-02-27 DIAGNOSIS — R21 Rash and other nonspecific skin eruption: Secondary | ICD-10-CM | POA: Diagnosis not present

## 2018-04-01 DIAGNOSIS — R809 Proteinuria, unspecified: Secondary | ICD-10-CM | POA: Diagnosis not present

## 2018-04-01 DIAGNOSIS — E1169 Type 2 diabetes mellitus with other specified complication: Secondary | ICD-10-CM | POA: Diagnosis not present

## 2018-04-01 DIAGNOSIS — E1159 Type 2 diabetes mellitus with other circulatory complications: Secondary | ICD-10-CM | POA: Diagnosis not present

## 2018-04-01 DIAGNOSIS — E1129 Type 2 diabetes mellitus with other diabetic kidney complication: Secondary | ICD-10-CM | POA: Diagnosis not present

## 2018-04-01 DIAGNOSIS — Z794 Long term (current) use of insulin: Secondary | ICD-10-CM | POA: Diagnosis not present

## 2018-04-07 ENCOUNTER — Encounter: Payer: Self-pay | Admitting: Family Medicine

## 2018-04-09 DIAGNOSIS — R21 Rash and other nonspecific skin eruption: Secondary | ICD-10-CM | POA: Diagnosis not present

## 2018-04-09 DIAGNOSIS — B353 Tinea pedis: Secondary | ICD-10-CM | POA: Diagnosis not present

## 2018-05-21 ENCOUNTER — Other Ambulatory Visit: Payer: Self-pay | Admitting: Physician Assistant

## 2018-05-26 ENCOUNTER — Ambulatory Visit
Admission: RE | Admit: 2018-05-26 | Discharge: 2018-05-26 | Disposition: A | Discharge status: Home / Self Care | Payer: 59 | Source: Ambulatory Visit | Attending: Urology | Admitting: Urology

## 2018-05-26 DIAGNOSIS — N509 Disorder of male genital organs, unspecified: Secondary | ICD-10-CM | POA: Insufficient documentation

## 2018-05-26 DIAGNOSIS — N5089 Other specified disorders of the male genital organs: Secondary | ICD-10-CM

## 2018-05-29 ENCOUNTER — Other Ambulatory Visit: Payer: 59

## 2018-05-29 DIAGNOSIS — R972 Elevated prostate specific antigen [PSA]: Secondary | ICD-10-CM | POA: Diagnosis not present

## 2018-05-30 DIAGNOSIS — E1165 Type 2 diabetes mellitus with hyperglycemia: Secondary | ICD-10-CM | POA: Diagnosis not present

## 2018-05-30 LAB — PSA: Prostate Specific Ag, Serum: 4.7 ng/mL — ABNORMAL HIGH (ref 0.0–4.0)

## 2018-06-02 ENCOUNTER — Ambulatory Visit: Payer: 59 | Admitting: Urology

## 2018-06-02 ENCOUNTER — Encounter: Payer: Self-pay | Admitting: Urology

## 2018-06-02 VITALS — BP 132/85 | HR 102 | Ht 70.0 in | Wt 279.0 lb

## 2018-06-02 DIAGNOSIS — N509 Disorder of male genital organs, unspecified: Secondary | ICD-10-CM | POA: Diagnosis not present

## 2018-06-02 DIAGNOSIS — N401 Enlarged prostate with lower urinary tract symptoms: Secondary | ICD-10-CM

## 2018-06-02 DIAGNOSIS — N5203 Combined arterial insufficiency and corporo-venous occlusive erectile dysfunction: Secondary | ICD-10-CM | POA: Diagnosis not present

## 2018-06-02 DIAGNOSIS — R972 Elevated prostate specific antigen [PSA]: Secondary | ICD-10-CM | POA: Diagnosis not present

## 2018-06-02 DIAGNOSIS — N5089 Other specified disorders of the male genital organs: Secondary | ICD-10-CM

## 2018-06-02 DIAGNOSIS — N138 Other obstructive and reflux uropathy: Secondary | ICD-10-CM

## 2018-06-02 MED ORDER — SILDENAFIL CITRATE 20 MG PO TABS: ORAL_TABLET | ORAL | 3 refills | Status: DC

## 2018-06-02 MED ORDER — FINASTERIDE 5 MG PO TABS: 5.0000 mg | ORAL_TABLET | Freq: Every day | ORAL | 4 refills | Status: DC

## 2018-06-02 NOTE — Progress Notes (Signed)
06/02/2018 9:44 AM   Johnny Maddox 03/27/1956 [id number]  Referring provider: [name], MD [place], [place] [address]  Chief Complaint  Patient presents with  . Elevated PSA    9 month  . Hydrocele    HPI:  62 yo M with history of elevated PSA, ED, BPH and  left intratesticular neoplasm.   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   Most recent scrotal ultrasound 05/2018 shows stable 4 mm left testicular nodule mass.    He has been performing regular self testicular exams without change.  Personal history of elevated PSA.  PSA trend as below. He did have an upward trending PSA to 6.2, but had a brief hiatus around this time from his finasteride.  He since resumed this medication his PSA is trending back down again.  Today however, his PSA appears to be trending back up again, most recently to 4.7 on 05/29/2018.  He as run out of finasteride x several months.   Personal history of BPH, symptoms well controlled on finasteride.  Recently ran out of this medicine a few months ago as above.  He does get up twice a night to pee.  His stream is excellent.  In the past, he had worsening of his urinary symptoms being off of this medication.  He is requesting refills today.  Personal history of erectile dysfunction does well with sildenafil.  He is requesting a refill today to new pharmacy given that his previous [facility].   PMH: Past Medical History:  Diagnosis Date  . Acute laryngitis, without mention of obstruction   . Allergic rhinitis, cause unspecified   . Chalazion   . Cough   . Dermatophytosis of foot   . Diabetes mellitus without complication ([place])   . Elevated PSA   . Hyperlipidemia   . Hypertension   .  Microalbuminuria   . Overweight   . Seborrheic dermatitis, unspecified   . Sleep apnea   . Unspecified disorder of autonomic nervous system   . Unspecified sleep apnea   . Unspecified venous (peripheral) insufficiency   . Unspecified vitamin D deficiency     Surgical History: Past Surgical History:  Procedure Laterality Date  . CIRCUMCISION    . HYDROCELE EXCISION Right 09/23/2016   Procedure: HYDROCELECTOMY ADULT ;  Surgeon: Hollice Espy, MD;  Location: [facility] ORS;  Service: Urology;  Laterality: Right;  INGUINAL APPROACH  . ROTATOR CUFF REPAIR  R 2011, L 2006   L and R shoulder  . TONSILLECTOMY AND ADENOIDECTOMY      Home Medications:  Allergies as of 06/02/2018      Reactions   Other Anaphylaxis   Mushrooms   Seasonal Ic [cholestatin]       Medication List        Accurate as of 06/02/18  9:44 AM. Always use your most recent med list.          aspirin EC 81 MG tablet Take 81 mg by mouth every evening.   atorvastatin 40 MG tablet Commonly known as:  LIPITOR Take 40 mg by mouth every evening.   cetirizine 10 MG tablet Commonly known as:  ZYRTEC Take 1 tablet (10 mg total) by mouth daily.   chlorthalidone 25 MG tablet Commonly known as:  HYGROTON TAKE  1 TABLET BY MOUTH EVERY DAY   finasteride 5 MG tablet Commonly known as:  PROSCAR Take 1 tablet (5 mg total) by mouth daily.   FREESTYLE LIBRE READER Devi Use 1 Units as directed.   HUMALOG KWIKPEN 100 UNIT/ML KiwkPen Generic drug:  insulin lispro INJECT 12 UNITS SUBCUTANEOUSLY DAILY AT 7 AM.   Insulin Glargine 100 UNIT/ML Solostar Pen Commonly known as:  LANTUS Inject 45 Units into the skin daily at 10 pm.   MULTI-VITAMINS Tabs Take by mouth.   potassium chloride 10 MEQ tablet Commonly known as:  K-DUR Take 1 tablet (10 mEq total) by mouth daily.   sildenafil 20 MG tablet Commonly known as:  REVATIO Take 3 to 5 tablets two hours before intercouse on an empty stomach.  Do not take with nitrates.    terbinafine 250 MG tablet Commonly known as:  LAMISIL Take 1 tablet (250 mg total) by mouth daily.   traMADol 50 MG tablet Commonly known as:  ULTRAM TAKE 1 TABLET BY MOUTH EVERY 8 HOURS AS NEEDED   TRULICITY 1.5 BZ/1.6RC Sopn Generic drug:  Dulaglutide Inject 1.5 mg into the skin every Saturday.       Allergies:  Allergies  Allergen Reactions  . Other Anaphylaxis    Mushrooms  . Seasonal Ic [Cholestatin]     Family History: Family History  Problem Relation Age of Onset  . Cancer Mother        leukemia  . Hypertension Mother   . Alcohol abuse Father   . Hypertension Brother   . Prostate cancer Neg Hx   . Bladder Cancer Neg Hx   . Kidney disease Neg Hx   . Kidney cancer Neg Hx     Social History:  reports that he has quit smoking. He has never used smokeless tobacco. He reports that he drinks about 1.2 oz of alcohol per week. He reports that he does not use drugs.  ROS: UROLOGY Frequent Urination?: No Hard to postpone urination?: No Burning/pain with urination?: No Get up at night to urinate?: No Leakage of urine?: No Urine stream starts and stops?: No Trouble starting stream?: No Do you have to strain to urinate?: No Blood in urine?: No Urinary tract infection?: No Sexually transmitted disease?: No Injury to kidneys or bladder?: No Painful intercourse?: No Weak stream?: No Erection problems?: No Penile pain?: No  Gastrointestinal Nausea?: No Vomiting?: No Indigestion/heartburn?: No Diarrhea?: No Constipation?: No  Constitutional Fever: No Night sweats?: No Weight loss?: No Fatigue?: No  Skin Skin rash/lesions?: No Itching?: No  Eyes Blurred vision?: No Double vision?: No  Ears/Nose/Throat Sore throat?: No Sinus problems?: No  Hematologic/Lymphatic Swollen glands?: No Easy bruising?: No  Cardiovascular Leg swelling?: No Chest pain?: No  Respiratory Cough?: No Shortness of breath?: No  Endocrine Excessive thirst?:  No  Musculoskeletal Back pain?: No Joint pain?: No  Neurological Headaches?: No Dizziness?: No  Psychologic Depression?: No Anxiety?: No  Physical Exam: BP 132/85   Pulse (!) 102   Ht 5\' 10"  (1.778 m)   Wt 279 lb (126.6 kg)   BMI 40.03 kg/m   Constitutional:  Alert and oriented, No acute distress. HEENT: [place] AT, moist mucus membranes.  Trachea midline, no masses. Cardiovascular: No clubbing, cyanosis, or edema. Respiratory: Normal respiratory effort, no increased work of breathing. GI: Abdomen is soft, nontender, nondistended, no abdominal masses GU: Uncircumcised phallus with orthotopic meatus.  Bilateral descended testicles.  No testicular nodules or lesions bilaterally. Rectal: Normal sphincter tone, enlarged prostate, exam  difficult secondary to habitus, no nodules at the apex or mid gland. Skin: No rashes, bruises or suspicious lesions. Neurologic: Grossly intact, no focal deficits, moving all 4 extremities. Psychiatric: Normal mood and affect.  Laboratory Data: Lab Results  Component Value Date   WBC 8.0 01/28/2018   HGB 14.5 01/28/2018   HCT 44.6 01/28/2018   MCV 84.5 01/28/2018   PLT 152 12/28/2012    Lab Results  Component Value Date   CREATININE 1.10 01/28/2018     Lab Results  Component Value Date   HGBA1C 8.1 05/23/2017   Component     Latest Ref Rng & Units 05/08/2015 10/11/2015 04/03/2016 04/10/2016  Prostate Specific Ag, Serum     0.0 - 4.0 ng/mL 2.6 2.5 4.4 (H) 4.0   Component     Latest Ref Rng & Units 10/11/2016 02/04/2017 05/23/2017 12/22/2017  Prostate Specific Ag, Serum     0.0 - 4.0 ng/mL 6.2 (H) 3.7 3.4 3.3   Component     Latest Ref Rng & Units 05/29/2018  Prostate Specific Ag, Serum     0.0 - 4.0 ng/mL 4.7 (H)    Urinalysis N/a  Pertinent Imaging: CLINICAL DATA:  Follow-up examination for left testicular mass.  EXAM: SCROTAL ULTRASOUND  DOPPLER ULTRASOUND OF THE TESTICLES  TECHNIQUE: Complete ultrasound examination of  the testicles, epididymis, and other scrotal structures was performed. Color and spectral Doppler ultrasound were also utilized to evaluate blood flow to the testicles.  COMPARISON:  Prior ultrasound from 05/12/2017.  FINDINGS: Right testicle  Measurements: 4.4 x 2.1 x 2.6 cm. No mass or microlithiasis visualized.  Left testicle  Measurements:  5.0 x 2.5 x 2.9 cm.  Again seen is a  Round well-circumscribed hypoechoic and mildly complex mass positioned at the peripheral subcapsular abscess affect of the lower left testicle, measuring 3 x 4 x 4 mm on today's exam, previously 4 x 4 x 4 mm. No internal vascularity.  Right epididymis: Normal in size and appearance. Incidental note made of a 3 mm simple cyst at the right epididymal head.  Left epididymis:  Normal in size and appearance.  Hydrocele:  None visualized.  Varicocele:  None visualized.  Pulsed Doppler interrogation of both testes demonstrates normal low resistance arterial and venous waveforms bilaterally.  IMPRESSION: 1. Stable appearance of 4 mm hypoechoic left testicular nodule/mass, stable as compared to previous exams dating back to 2017. Again, while size stability supports a benign lesion, a possible slow growing indolent testicular tumor is not excluded. Continued surveillance is warranted. 2. Otherwise negative scrotal ultrasound.   Electronically Signed   By: Jeannine Boga M.D.   On: 05/26/2018 19:22  Scrotal ultrasound imaging was personally reviewed today and compared to previous scans.  Assessment & Plan:    1. 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  Elevated PSA History of fluctuating PSA Most recently noted to have a rise in his PSA but is run out of finasteride medication This is occurred in the past as well which is reassuring He will resume finasteride and have his PSA repeated again in 3 months to ensure that it is trending back downwards We will continue to follow him annually  3. BPH with obstruction/lower  urinary tract symptoms Well-controlled on finasteride Prescription refill today  4. Combined arterial insufficiency and corporo-venous occlusive erectile dysfunction Requesting refills of sildenafil to new pharmacy, discussed filling this prescription at [facility]   Return in about 1 year (around 06/03/2019) for lab only PSA in 3 months, See shannon in 1 year for ipss/ PSA/ DRE.  Hollice Espy, MD  [facility] [address], [place], [place] [address] [phone]

## 2018-08-10 ENCOUNTER — Encounter: Payer: Self-pay | Admitting: Podiatry

## 2018-08-10 ENCOUNTER — Ambulatory Visit: Payer: 59 | Admitting: Podiatry

## 2018-08-10 DIAGNOSIS — M79674 Pain in right toe(s): Secondary | ICD-10-CM

## 2018-08-10 DIAGNOSIS — E119 Type 2 diabetes mellitus without complications: Secondary | ICD-10-CM | POA: Diagnosis not present

## 2018-08-10 DIAGNOSIS — B351 Tinea unguium: Secondary | ICD-10-CM

## 2018-08-10 DIAGNOSIS — M79675 Pain in left toe(s): Secondary | ICD-10-CM

## 2018-08-10 NOTE — Progress Notes (Signed)
Complaint:  Visit Type: Patient returns to my office for continued preventative foot care services. Complaint: Patient states" my nails have grown long and thick and become painful to walk and wear shoes" Patient has been diagnosed with DM with no foot complications. The patient presents for preventative foot care services. No changes to ROS.  This was the patient who skin peeled off completely due to probable Lamisil allergic reaction.  He says that it took 4 weeks for the foot to look normal.  He is still concerned about the thickness of these nails and presents the office today for continued evaluation and treatment  Podiatric Exam: Vascular: dorsalis pedis and posterior tibial pulses are palpable bilateral. Capillary return is immediate. Temperature gradient is WNL. Skin turgor WNL  Sensorium: Normal Semmes Weinstein monofilament test. Normal tactile sensation bilaterally. Nail Exam: Pt has thick disfigured discolored nails with subungual debris noted bilateral entire nail hallux through fifth toenails Ulcer Exam: There is no evidence of ulcer or pre-ulcerative changes or infection. Orthopedic Exam: Muscle tone and strength are WNL. No limitations in general ROM. No crepitus or effusions noted. Foot type and digits show no abnormalities. Bony prominences are unremarkable. Skin: No Porokeratosis. No infection or ulcers  Diagnosis:  Onychomycosis, , Pain in right toe, pain in left toes  Treatment & Plan Procedures and Treatment: Consent by patient was obtained for treatment procedures.   Debridement of mycotic and hypertrophic toenails, 1 through 5 bilateral and clearing of subungual debris. No ulceration, no infection noted.  Discussed continued treatment for this patient .I told him we could perform preventative foot care services or Sporanox or a new medication with the urea in the solution .  He chose to have preventative foot care services performed today and then obtain the fungus solution  with urea in the future .  Return Visit-Office Procedure: Patient instructed to return to the office for a follow up visit 3 months for continued evaluation and treatment.  Patient to call the office in 2 weeks to check on the status of the medication.    Gardiner Barefoot DPM

## 2018-08-21 DIAGNOSIS — Z23 Encounter for immunization: Secondary | ICD-10-CM | POA: Diagnosis not present

## 2018-09-02 ENCOUNTER — Other Ambulatory Visit: Payer: Self-pay | Admitting: Family Medicine

## 2018-09-02 DIAGNOSIS — R972 Elevated prostate specific antigen [PSA]: Secondary | ICD-10-CM

## 2018-09-03 ENCOUNTER — Other Ambulatory Visit: Payer: 59

## 2018-09-03 ENCOUNTER — Encounter: Payer: Self-pay | Admitting: Urology

## 2018-09-05 DIAGNOSIS — L03012 Cellulitis of left finger: Secondary | ICD-10-CM | POA: Diagnosis not present

## 2018-09-09 ENCOUNTER — Telehealth: Payer: Self-pay | Admitting: Podiatry

## 2018-09-09 NOTE — Telephone Encounter (Signed)
I was seen about a month ago and I was supposed to get some information for medication regarding my toenail fungus. The doctor was going to a seminar out of town. I have not heard anything from anyone in the office. Please call me back at [phone].

## 2018-09-15 ENCOUNTER — Telehealth: Payer: Self-pay | Admitting: *Deleted

## 2018-09-15 NOTE — Telephone Encounter (Signed)
Called Johnny Maddox, informed that antifungal solution (Tolcylen) is at office and ready for pick up,price per tube is 59.00, patient verbally understood and said ok.

## 2018-09-29 DIAGNOSIS — R809 Proteinuria, unspecified: Secondary | ICD-10-CM | POA: Diagnosis not present

## 2018-09-29 DIAGNOSIS — E1159 Type 2 diabetes mellitus with other circulatory complications: Secondary | ICD-10-CM | POA: Diagnosis not present

## 2018-09-29 DIAGNOSIS — E1129 Type 2 diabetes mellitus with other diabetic kidney complication: Secondary | ICD-10-CM | POA: Diagnosis not present

## 2018-09-29 DIAGNOSIS — Z794 Long term (current) use of insulin: Secondary | ICD-10-CM | POA: Diagnosis not present

## 2018-09-29 DIAGNOSIS — E1169 Type 2 diabetes mellitus with other specified complication: Secondary | ICD-10-CM | POA: Diagnosis not present

## 2018-11-09 ENCOUNTER — Ambulatory Visit: Payer: 59 | Admitting: Podiatry

## 2018-11-12 ENCOUNTER — Ambulatory Visit: Payer: 59 | Admitting: Podiatry

## 2018-11-12 ENCOUNTER — Encounter: Payer: Self-pay | Admitting: Podiatry

## 2018-11-12 DIAGNOSIS — E119 Type 2 diabetes mellitus without complications: Secondary | ICD-10-CM

## 2018-11-12 DIAGNOSIS — M79675 Pain in left toe(s): Secondary | ICD-10-CM | POA: Diagnosis not present

## 2018-11-12 DIAGNOSIS — M79674 Pain in right toe(s): Secondary | ICD-10-CM

## 2018-11-12 DIAGNOSIS — D869 Sarcoidosis, unspecified: Secondary | ICD-10-CM | POA: Insufficient documentation

## 2018-11-12 DIAGNOSIS — B351 Tinea unguium: Secondary | ICD-10-CM

## 2018-11-12 NOTE — Progress Notes (Signed)
Complaint:  Visit Type: Patient returns to my office for continued preventative foot care services. Complaint: Patient states" my nails have grown long and thick and become painful to walk and wear shoes" Patient has been diagnosed with DM with no foot complications. The patient presents for preventative foot care services. No changes to ROS.  Patient is diabetic with no diabetic complications.  Podiatric Exam: Vascular: dorsalis pedis and posterior tibial pulses are palpable bilateral. Capillary return is immediate. Temperature gradient is WNL. Skin turgor WNL  Sensorium: Normal Semmes Weinstein monofilament test. Normal tactile sensation bilaterally. Nail Exam: Pt has thick disfigured discolored nails with subungual debris noted bilateral entire nail hallux through fifth toenails Ulcer Exam: There is no evidence of ulcer or pre-ulcerative changes or infection. Orthopedic Exam: Muscle tone and strength are WNL. No limitations in general ROM. No crepitus or effusions noted. Foot type and digits show no abnormalities. Bony prominences are unremarkable. Skin: No Porokeratosis. No infection or ulcers  Diagnosis:  Onychomycosis, , Pain in right toe, pain in left toes  Treatment & Plan Procedures and Treatment: Consent by patient was obtained for treatment procedures.   Debridement of mycotic and hypertrophic toenails, 1 through 5 bilateral and clearing of subungual debris. No ulceration, no infection noted. Continue using fungus solution with urea. Return Visit-Office Procedure: Patient instructed to return to the office for a follow up visit 3 months for continued evaluation and treatment.      Gardiner Barefoot DPM

## 2018-12-06 DIAGNOSIS — J019 Acute sinusitis, unspecified: Secondary | ICD-10-CM | POA: Diagnosis not present

## 2018-12-26 ENCOUNTER — Other Ambulatory Visit: Payer: Self-pay | Admitting: Family Medicine

## 2018-12-26 DIAGNOSIS — R059 Cough, unspecified: Secondary | ICD-10-CM

## 2018-12-26 DIAGNOSIS — R0982 Postnasal drip: Secondary | ICD-10-CM

## 2018-12-26 DIAGNOSIS — R05 Cough: Secondary | ICD-10-CM

## 2018-12-28 ENCOUNTER — Other Ambulatory Visit: Payer: Self-pay | Admitting: Urology

## 2018-12-28 NOTE — Telephone Encounter (Signed)
Pt calls office in need of refill for his sildenafil (REVATIO) 20 MG tablet please sent to [facility] in [place]. Please advise pt at [phone]

## 2018-12-28 NOTE — Telephone Encounter (Signed)
Requested medication (s) are due for refill today -no  Requested medication (s) are on the active medication list -no  Future visit scheduled -no  Last refill: 1 year ago  Notes to clinic: Patient is requesting refill of discontinued medication- sent for provider review.  Requested Prescriptions  Pending Prescriptions Disp Refills   famotidine (PEPCID) 20 MG tablet [Pharmacy Med Name: FAMOTIDINE 20 MG TABLET] 60 tablet 1    Sig: TAKE 1 TABLET BY MOUTH TWICE A DAY     Gastroenterology:  H2 Antagonists Passed - 12/26/2018  3:16 PM      Passed - Valid encounter within last 12 months    Recent Outpatient Visits          11 months ago Skin sloughing   [facility] [place], PA-C   11 months ago Skin sloughing   [facility] [place], PA-C   11 months ago Bilateral swelling of feet   [facility] [place], PA-C   1 year ago Cough   [facility], Arlie Solomons, MD   1 year ago Encounter for health maintenance examination in adult   [facility] at [facility], Arlie Solomons, MD      Future Appointments            In 5 months McGowan, Gordan Payment [place]            Requested Prescriptions  Pending Prescriptions Disp Refills   famotidine (PEPCID) 20 MG tablet [Pharmacy Med Name: FAMOTIDINE 20 MG TABLET] 60 tablet 1    Sig: TAKE 1 [place]     Gastroenterology:  H2 Antagonists Passed - 12/26/2018  3:16 PM      Passed - Valid encounter within last 12 months    Recent Outpatient Visits          11 months ago Skin sloughing   [facility] [place], PA-C   11 months ago Skin sloughing   [facility] [place], PA-C   11 months ago Bilateral swelling of feet   [facility] [place], PA-C   1 year ago Cough   [facility] at [facility], Arlie Solomons, MD   1 year ago Encounter for health maintenance examination in  adult   [facility] at [facility], Arlie Solomons, MD      Future Appointments            In 5 months McGowan, Shannon A, [place]

## 2018-12-29 MED ORDER — SILDENAFIL CITRATE 20 MG PO TABS: ORAL_TABLET | ORAL | 3 refills | Status: DC

## 2018-12-29 NOTE — Telephone Encounter (Signed)
Rx sent to pharmacy   

## 2019-02-08 ENCOUNTER — Ambulatory Visit: Payer: 59 | Admitting: Podiatry

## 2019-02-11 ENCOUNTER — Ambulatory Visit: Payer: 59 | Admitting: Podiatry

## 2019-03-11 ENCOUNTER — Other Ambulatory Visit: Payer: Self-pay

## 2019-03-11 ENCOUNTER — Ambulatory Visit: Payer: 59 | Admitting: Podiatry

## 2019-03-11 ENCOUNTER — Encounter: Payer: Self-pay | Admitting: Podiatry

## 2019-03-11 VITALS — Temp 97.0°F

## 2019-03-11 DIAGNOSIS — E119 Type 2 diabetes mellitus without complications: Secondary | ICD-10-CM | POA: Diagnosis not present

## 2019-03-11 DIAGNOSIS — M79675 Pain in left toe(s): Secondary | ICD-10-CM | POA: Diagnosis not present

## 2019-03-11 DIAGNOSIS — M205X9 Other deformities of toe(s) (acquired), unspecified foot: Secondary | ICD-10-CM

## 2019-03-11 DIAGNOSIS — M79674 Pain in right toe(s): Secondary | ICD-10-CM | POA: Diagnosis not present

## 2019-03-11 DIAGNOSIS — B351 Tinea unguium: Secondary | ICD-10-CM

## 2019-03-11 NOTE — Progress Notes (Signed)
Complaint:  Visit Type: Patient returns to my office for continued preventative foot care services. Complaint: Patient states" my nails have grown long and thick and become painful to walk and wear shoes" Patient has been diagnosed with DM with no foot complications. The patient presents for preventative foot care services. No changes to ROS.  Patient is diabetic with no diabetic complications.  Podiatric Exam: Vascular: dorsalis pedis and posterior tibial pulses are palpable bilateral. Capillary return is immediate. Temperature gradient is WNL. Skin turgor WNL  Sensorium: Normal Semmes Weinstein monofilament test. Normal tactile sensation bilaterally. Nail Exam: Pt has thick disfigured discolored nails with subungual debris noted bilateral entire nail hallux through fifth toenails Ulcer Exam: There is no evidence of ulcer or pre-ulcerative changes or infection. Orthopedic Exam: Muscle tone and strength are WNL. No limitations in general ROM. No crepitus or effusions noted. Foot type and digits show no abnormalities. Bony prominences are unremarkable. Skin: No Porokeratosis. No infection or ulcers  Diagnosis:  Onychomycosis, , Pain in right toe, pain in left toes  Treatment & Plan Procedures and Treatment: Consent by patient was obtained for treatment procedures.   Debridement of mycotic and hypertrophic toenails, 1 through 5 bilateral and clearing of subungual debris. No ulceration, no infection noted. Consider nail surgery for removal second toenail right foot. Return Visit-Office Procedure: Patient instructed to return to the office for a follow up visit 3 months for continued evaluation and treatment.      Gardiner Barefoot DPM

## 2019-06-02 ENCOUNTER — Other Ambulatory Visit: Payer: 59

## 2019-06-03 ENCOUNTER — Ambulatory Visit: Payer: 59 | Admitting: Urology

## 2019-06-04 ENCOUNTER — Ambulatory Visit: Payer: 59 | Admitting: Urology

## 2019-06-04 ENCOUNTER — Other Ambulatory Visit: Payer: Self-pay | Admitting: Family Medicine

## 2019-06-04 NOTE — Telephone Encounter (Signed)
Forwarding medication refill to PCP for review. 

## 2019-06-10 ENCOUNTER — Ambulatory Visit: Payer: 59 | Admitting: Podiatry

## 2019-07-01 ENCOUNTER — Ambulatory Visit: Payer: 59 | Admitting: Podiatry

## 2019-07-01 ENCOUNTER — Other Ambulatory Visit: Payer: Self-pay

## 2019-07-08 ENCOUNTER — Ambulatory Visit: Payer: Self-pay | Admitting: Urology

## 2019-08-04 ENCOUNTER — Ambulatory Visit (INDEPENDENT_AMBULATORY_CARE_PROVIDER_SITE_OTHER): Payer: Managed Care, Other (non HMO) | Admitting: Family Medicine

## 2019-08-04 ENCOUNTER — Other Ambulatory Visit: Payer: Self-pay

## 2019-08-04 ENCOUNTER — Ambulatory Visit (INDEPENDENT_AMBULATORY_CARE_PROVIDER_SITE_OTHER): Payer: Managed Care, Other (non HMO)

## 2019-08-04 ENCOUNTER — Encounter: Payer: Self-pay | Admitting: Family Medicine

## 2019-08-04 VITALS — BP 144/88 | HR 97 | Temp 98.7°F | Resp 96 | Ht 70.0 in | Wt 284.8 lb

## 2019-08-04 DIAGNOSIS — M542 Cervicalgia: Secondary | ICD-10-CM | POA: Diagnosis not present

## 2019-08-04 DIAGNOSIS — M25511 Pain in right shoulder: Secondary | ICD-10-CM

## 2019-08-04 NOTE — Patient Instructions (Addendum)
   RIGHT SHOULDER - 2+ VIEW  COMPARISON:  None.  FINDINGS: There is no evidence of fracture or dislocation. There is no evidence of arthropathy or other focal bone abnormality. Soft tissues are unremarkable.  IMPRESSION: Negative.   Electronically Signed   By: Marijo Conception M.D.   On: 08/04/2019 15:23   EXAM: CERVICAL SPINE - COMPLETE 4+ VIEW  COMPARISON:  No recent.  FINDINGS: Diffuse severe multilevel degenerative change noted with straightening of cervical spine. Multilevel bilateral mild neural foraminal narrowing. Ligamentous ossification. No acute bony abnormality identified. No evidence of fracture dislocation. Pulmonary apices are clear.  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 Associated straightening cervical spine noted. Multilevel bilateral mild neural foraminal narrowing. No acute bony abnormality identified.   Electronically Signed   By: Marcello Moores  Register   On: 08/04/2019 15:25  If you have lab work done today you will be contacted with your lab results within the next 2 weeks.  If you have not heard from [place] then please contact us. The fastest way to get your results is to register for My Chart.   IF you received an x-ray today, you will receive an invoice from [facility]. Please contact [facility] at [phone] with questions or concerns regarding your invoice.   IF you received labwork today, you will receive an invoice from [facility]. Please contact [facility] at [phone] with questions or concerns regarding your invoice.   Our billing staff will not be able to assist you with questions regarding bills from these companies.  You will be contacted with the lab results as soon as they are available. The fastest way to get your results is to activate your My Chart account. Instructions are located on the last page of this paperwork. If you have not heard from [place] regarding the  results in 2 weeks, please contact this office.
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
## 2019-08-11 ENCOUNTER — Ambulatory Visit: Payer: Self-pay | Admitting: Urology

## 2019-08-27 ENCOUNTER — Ambulatory Visit: Payer: Managed Care, Other (non HMO)

## 2019-09-01 ENCOUNTER — Other Ambulatory Visit: Payer: Self-pay

## 2019-09-01 ENCOUNTER — Telehealth: Payer: Managed Care, Other (non HMO) | Admitting: Family Medicine

## 2019-09-03 ENCOUNTER — Other Ambulatory Visit: Payer: Self-pay

## 2019-09-03 DIAGNOSIS — R972 Elevated prostate specific antigen [PSA]: Secondary | ICD-10-CM

## 2019-09-03 DIAGNOSIS — N138 Other obstructive and reflux uropathy: Secondary | ICD-10-CM

## 2019-09-03 DIAGNOSIS — N401 Enlarged prostate with lower urinary tract symptoms: Secondary | ICD-10-CM

## 2019-09-06 ENCOUNTER — Other Ambulatory Visit: Payer: Self-pay

## 2019-09-08 NOTE — Progress Notes (Signed)
09/09/2019 3:34 PM   Johnny Maddox 11-Sep-1956 [id number]  Referring provider: [name], MD [place],  [place] [address]  Chief Complaint  Patient presents with  . Elevated PSA    HPI:  63 yo M with history of elevated PSA, ED, BPH and  left intratesticular neoplasm.  Elevated PSA Personal history of elevated PSA.  PSA trend as below. His fluctuations may be due to his symptoms consistent use of finasteride.  PSA is pending from today.    Component     Latest Ref Rng & Units 05/08/2015 10/11/2015 04/03/2016 04/10/2016  Prostate Specific Ag, Serum     0.0 - 4.0 ng/mL 2.6 2.5 4.4 (H) 4.0   Component     Latest Ref Rng & Units 10/11/2016 02/04/2017 05/23/2017 12/22/2017  Prostate Specific Ag, Serum     0.0 - 4.0 ng/mL 6.2 (H) 3.7 3.4 3.3   Component     Latest Ref Rng & Units 05/29/2018  Prostate Specific Ag, Serum     0.0 - 4.0 ng/mL 4.7 (H)     Erectile dysfunction SHIM score: 5  Previous SHIM score: 7 Main complaint: Achieving erections  x 6 years Risk factors:  age, BPH, DM and HTN, HLD and sleep apnea.     No painful erections or curvatures with his erections.    Still having having spontaneous erections Tried: PDE5i's and Trimix - sildenafil gave a headache   SHIM    Row Name 09/09/19 1518         SHIM: Over the last 6 months:   How do you rate your confidence that you could get and keep an erection?  Very Low     When you had erections with sexual stimulation, how often were your erections hard enough for penetration (entering your partner)?  Almost Never or Never     During sexual intercourse, how often were you able to maintain your erection after you had penetrated (entered) your partner?  Almost Never or Never     During sexual intercourse, how difficult was it to maintain your erection to completion of intercourse?  Extremely Difficult     When you attempted sexual intercourse, how often was it satisfactory for you?  Almost Never or Never       SHIM Total Score   SHIM  5        Score: 1-7 Severe ED 8-11 Moderate ED 12-16 Mild-Moderate ED 17-21 Mild ED 22-25 No ED  BPH WITH LUTS  (prostate and/or bladder) IPSS score: 9/3   Previous score: 15/2   Previous PVR: 27 mL  Major complaint(s):  None.  Denies any dysuria, hematuria or suprapubic pain.   Currently taking: finasteride 5 mg daily.    Denies any recent fevers, chills, nausea or vomiting.  He does not have a family history of PCa.  IPSS    Row Name 09/09/19 1500         International Prostate Symptom Score   How often have you had the sensation of not emptying your bladder?  Not at All     How often have you had to urinate less than every two hours?  Almost always     How often have you found you stopped and started again several times when you urinated?  Not at All     How often have you found it difficult to postpone urination?  About half the time     How often have you had a  weak urinary stream?  Not at All     How often have you had to strain to start urination?  Not at All     How many times did you typically get up at night to urinate?  1 Time     Total IPSS Score  9       Quality of Life due to urinary symptoms   If you were to spend the rest of your life with your urinary condition just the way it is now how would you feel about that?  Mixed        Score:  1-7 Mild 8-19 Moderate 20-35 Severe  Left intratesticular mass Incidental identified left intratesticular 5 x 4 x 4 mm hypoechoic mass within the left testicle which was not previously appreciated on scrotal ultrasound 05/2015 This was stable from scrotal ultrasound performed on 08/13/2016 to 09/16/2016, 11/22/2016, and 05/12/17.   Most recent scrotal ultrasound 05/2018 shows stable 4 mm left testicular nodule mass.  He has been performing regular self testicular exams without change.  Will undergo repeat scrotal ultrasound in 05/2020   PMH: Past Medical History:  Diagnosis Date  . Acute  laryngitis, without mention of obstruction   . Allergic rhinitis, cause unspecified   . Chalazion   . Cough   . Dermatophytosis of foot   . Diabetes mellitus without complication ([place])   . Elevated PSA   . Hyperlipidemia   . Hypertension   . Microalbuminuria   . Overweight   . Seborrheic dermatitis, unspecified   . Sleep apnea   . Unspecified disorder of autonomic nervous system   . Unspecified sleep apnea   . Unspecified venous (peripheral) insufficiency   . Unspecified vitamin D deficiency     Surgical History: Past Surgical History:  Procedure Laterality Date  . CIRCUMCISION    . HYDROCELE EXCISION Right 09/23/2016   Procedure: HYDROCELECTOMY ADULT ;  Surgeon: Hollice Espy, MD;  Location: [facility] ORS;  Service: Urology;  Laterality: Right;  INGUINAL APPROACH  . ROTATOR CUFF REPAIR  R 2011, L 2006   L and R shoulder  . TONSILLECTOMY AND ADENOIDECTOMY      Home Medications:  Allergies as of 09/09/2019      Reactions   Other Anaphylaxis   Mushrooms   Seasonal Ic [cholestatin]       Medication List       Accurate as of September 09, 2019  3:34 PM. If you have any questions, ask your nurse or doctor.        aspirin EC 81 MG tablet Take 81 mg by mouth every evening.   atorvastatin 40 MG tablet Commonly known as: LIPITOR Take 40 mg by mouth every evening.   chlorthalidone 25 MG tablet Commonly known as: HYGROTON TAKE 1 TABLET BY MOUTH EVERY DAY   FreeStyle Libre 14 Day Sensor Misc USE TO TEST BLOOD SUGAR, CHANGING EVERY 14 DAYS   FreeStyle Libre Reader Devi Use 1 Units as directed.   furosemide 20 MG tablet Commonly known as: LASIX Take 20 mg by mouth daily.   HumaLOG KwikPen 100 UNIT/ML KwikPen Generic drug: insulin lispro INJECT 12 UNITS SUBCUTANEOUSLY DAILY AT 7 AM.   lisinopril 20 MG tablet Commonly known as: ZESTRIL Take 20 mg by mouth daily.   metFORMIN 500 MG 24 hr tablet Commonly known as: GLUCOPHAGE-XR TAKE 2 TABLETS (1,000 MG TOTAL) BY  MOUTH DAILY WITH DINNER.   Multi-Vitamins Tabs Take by mouth.   sildenafil 20 MG tablet Commonly known as:  REVATIO Take 3 to 5 tablets two hours before intercouse on an empty stomach.  Do not take with nitrates.   Trulicity 1.5 0000000 Sopn Generic drug: Dulaglutide Inject 1.5 mg into the skin every Saturday.       Allergies:  Allergies  Allergen Reactions  . Other Anaphylaxis    Mushrooms  . Seasonal Ic [Cholestatin]     Family History: Family History  Problem Relation Age of Onset  . Cancer Mother        leukemia  . Hypertension Mother   . Alcohol abuse Father   . Hypertension Brother   . Prostate cancer Neg Hx   . Bladder Cancer Neg Hx   . Kidney disease Neg Hx   . Kidney cancer Neg Hx     Social History:  reports that he has quit smoking. He has never used smokeless tobacco. He reports current alcohol use of about 2.0 standard drinks of alcohol per week. He reports that he does not use drugs.  ROS: UROLOGY Frequent Urination?: No Hard to postpone urination?: No Burning/pain with urination?: No Get up at night to urinate?: No Leakage of urine?: No Urine stream starts and stops?: No Trouble starting stream?: No Do you have to strain to urinate?: No Blood in urine?: No Urinary tract infection?: No Sexually transmitted disease?: No Injury to kidneys or bladder?: No Painful intercourse?: No Weak stream?: No Erection problems?: Yes Penile pain?: No  Gastrointestinal Nausea?: No Vomiting?: No Indigestion/heartburn?: No Diarrhea?: No Constipation?: No  Constitutional Fever: No Night sweats?: No Weight loss?: No Fatigue?: No  Skin Skin rash/lesions?: No Itching?: No  Eyes Blurred vision?: No Double vision?: No  Ears/Nose/Throat Sore throat?: No Sinus problems?: No  Hematologic/Lymphatic Swollen glands?: No Easy bruising?: No  Cardiovascular Leg swelling?: No Chest pain?: No  Respiratory Cough?: No  Endocrine Excessive  thirst?: Yes  Musculoskeletal Back pain?: No Joint pain?: No  Neurological Headaches?: No Dizziness?: No  Psychologic Depression?: No Anxiety?: No  Physical Exam: BP (!) 165/92   Pulse 94   Ht 5\' 10"  (1.778 m)   Wt 282 lb (127.9 kg)   BMI 40.46 kg/m   Constitutional:  Well nourished. Alert and oriented, No acute distress. HEENT: [place] AT, moist mucus membranes.  Trachea midline, no masses. Cardiovascular: No clubbing, cyanosis, or edema. Respiratory: Normal respiratory effort, no increased work of breathing. GI: Abdomen is soft, non tender, non distended, no abdominal masses. Liver and spleen not palpable.  No hernias appreciated.  Stool sample for occult testing is not indicated.   GU: No CVA tenderness.  No bladder fullness or masses.  Patient with uncircumcised phallus.  Foreskin easily retracted   Urethral meatus is patent.  No penile discharge. No penile lesions or rashes. Scrotum without lesions, cysts, rashes and/or edema.  Testicles are located scrotally bilaterally. No masses are appreciated in the testicles. Left and right epididymis are normal. Rectal: Patient with  normal sphincter tone. Anus and perineum without scarring or rashes. No rectal masses are appreciated. Prostate is approximately 50 grams, could only palpate the apex and the midportion gland, no nodules are appreciated. Seminal vesicles could not be palpated.   Skin: No rashes, bruises or suspicious lesions. Lymph: No inguinal adenopathy. Neurologic: Grossly intact, no focal deficits, moving all 4 extremities. Psychiatric: Normal mood and affect.  Laboratory Data: Lab Results  Component Value Date   WBC 8.0 01/28/2018   HGB 14.5 01/28/2018   HCT 44.6 01/28/2018   MCV 84.5 01/28/2018   PLT  152 12/28/2012    Lab Results  Component Value Date   CREATININE 1.10 01/28/2018     Lab Results  Component Value Date   HGBA1C 8.1 05/23/2017   Component     Latest Ref Rng & Units 05/08/2015 10/11/2015  04/03/2016 04/10/2016  Prostate Specific Ag, Serum     0.0 - 4.0 ng/mL 2.6 2.5 4.4 (H) 4.0   Component     Latest Ref Rng & Units 10/11/2016 02/04/2017 05/23/2017 12/22/2017  Prostate Specific Ag, Serum     0.0 - 4.0 ng/mL 6.2 (H) 3.7 3.4 3.3   Component     Latest Ref Rng & Units 05/29/2018  Prostate Specific Ag, Serum     0.0 - 4.0 ng/mL 4.7 (H)    Urinalysis N/a  Pertinent Imaging: CLINICAL DATA:  Follow-up examination for left testicular mass.  EXAM: SCROTAL ULTRASOUND  DOPPLER ULTRASOUND OF THE TESTICLES  TECHNIQUE: Complete ultrasound examination of the testicles, epididymis, and other scrotal structures was performed. Color and spectral Doppler ultrasound were also utilized to evaluate blood flow to the testicles.  COMPARISON:  Prior ultrasound from 05/12/2017.  FINDINGS: Right testicle  Measurements: 4.4 x 2.1 x 2.6 cm. No mass or microlithiasis visualized.  Left testicle  Measurements:  5.0 x 2.5 x 2.9 cm.  Again seen is a  Round well-circumscribed hypoechoic and mildly complex mass positioned at the peripheral subcapsular abscess affect of the lower left testicle, measuring 3 x 4 x 4 mm on today's exam, previously 4 x 4 x 4 mm. No internal vascularity.  Right epididymis: Normal in size and appearance. Incidental note made of a 3 mm simple cyst at the right epididymal head.  Left epididymis:  Normal in size and appearance.  Hydrocele:  None visualized.  Varicocele:  None visualized.  Pulsed Doppler interrogation of both testes demonstrates normal low resistance arterial and venous waveforms bilaterally.  IMPRESSION: 1. Stable appearance of 4 mm hypoechoic left testicular nodule/mass, stable as compared to previous exams dating back to 2017. Again, while size stability supports a benign lesion, a possible slow growing indolent testicular tumor is not excluded. Continued surveillance is warranted. 2. Otherwise negative scrotal  ultrasound.   Electronically Signed   By: Jeannine Boga M.D.   On: 05/26/2018 19:22  Scrotal ultrasound imaging was personally reviewed today and compared to previous scans.  Assessment & Plan:    1. Testicular mass Will be due for repeat scrotal ultrasound in 05/2020 Patient will continue monthly self testicular exams He is agreeable this plan  2. Elevated PSA History of fluctuating PSA PSA pending  3. BPH with LUTS IPSS score is 9/3, it is worsening Continue conservative management, avoiding bladder irritants and timed voiding's Continue finasteride 5 mg daily - refills given [facility] in 6 months for IPSS, PSA and exam - if PSA is stable  4. Erectile dysfunction SHIM score is 5, it is worsening He would like to try a different PDE5i and will try tadalafil 20 mg - [facility] in 6 months for repeat SHIM score and exam    Return in about 6 months (around 03/09/2020) for IPSS, SHIM, PSA and exam.  Zara Council, [facility]  [facility] [address] [place], [place] [address] [phone]

## 2019-09-09 ENCOUNTER — Encounter: Payer: Self-pay | Admitting: Urology

## 2019-09-09 ENCOUNTER — Other Ambulatory Visit: Payer: Self-pay

## 2019-09-09 ENCOUNTER — Ambulatory Visit (INDEPENDENT_AMBULATORY_CARE_PROVIDER_SITE_OTHER): Payer: Managed Care, Other (non HMO) | Admitting: Urology

## 2019-09-09 VITALS — BP 165/92 | HR 94 | Ht 70.0 in | Wt 282.0 lb

## 2019-09-09 DIAGNOSIS — R972 Elevated prostate specific antigen [PSA]: Secondary | ICD-10-CM

## 2019-09-09 DIAGNOSIS — N529 Male erectile dysfunction, unspecified: Secondary | ICD-10-CM

## 2019-09-09 DIAGNOSIS — D4959 Neoplasm of unspecified behavior of other genitourinary organ: Secondary | ICD-10-CM

## 2019-09-09 DIAGNOSIS — N401 Enlarged prostate with lower urinary tract symptoms: Secondary | ICD-10-CM

## 2019-09-09 DIAGNOSIS — N138 Other obstructive and reflux uropathy: Secondary | ICD-10-CM

## 2019-09-09 MED ORDER — FINASTERIDE 5 MG PO TABS: 5.0000 mg | ORAL_TABLET | Freq: Every day | ORAL | 3 refills | Status: DC

## 2019-09-09 MED ORDER — TADALAFIL 20 MG PO TABS: 20.0000 mg | ORAL_TABLET | Freq: Every day | ORAL | 6 refills | Status: DC | PRN

## 2019-09-10 ENCOUNTER — Ambulatory Visit: Payer: Managed Care, Other (non HMO)

## 2019-09-10 LAB — PSA: Prostate Specific Ag, Serum: 6.5 ng/mL — ABNORMAL HIGH (ref 0.0–4.0)

## 2019-09-14 ENCOUNTER — Other Ambulatory Visit: Payer: Self-pay

## 2019-09-14 ENCOUNTER — Other Ambulatory Visit: Payer: Self-pay | Admitting: Family Medicine

## 2019-09-14 ENCOUNTER — Other Ambulatory Visit: Payer: Managed Care, Other (non HMO)

## 2019-09-14 DIAGNOSIS — N401 Enlarged prostate with lower urinary tract symptoms: Secondary | ICD-10-CM

## 2019-09-14 DIAGNOSIS — R972 Elevated prostate specific antigen [PSA]: Secondary | ICD-10-CM

## 2019-09-14 DIAGNOSIS — N138 Other obstructive and reflux uropathy: Secondary | ICD-10-CM

## 2019-09-14 DIAGNOSIS — N529 Male erectile dysfunction, unspecified: Secondary | ICD-10-CM

## 2019-09-14 DIAGNOSIS — E1165 Type 2 diabetes mellitus with hyperglycemia: Secondary | ICD-10-CM

## 2019-09-15 ENCOUNTER — Encounter: Payer: Self-pay | Admitting: Family Medicine

## 2019-09-15 ENCOUNTER — Telehealth (INDEPENDENT_AMBULATORY_CARE_PROVIDER_SITE_OTHER): Payer: Managed Care, Other (non HMO) | Admitting: Family Medicine

## 2019-09-15 VITALS — Ht 70.0 in | Wt 281.0 lb

## 2019-09-15 DIAGNOSIS — Z5329 Procedure and treatment not carried out because of patient's decision for other reasons: Secondary | ICD-10-CM

## 2019-09-15 LAB — PSA: Prostate Specific Ag, Serum: 5.3 ng/mL — ABNORMAL HIGH (ref 0.0–4.0)

## 2019-09-15 NOTE — Progress Notes (Signed)
DM f/u  

## 2019-09-15 NOTE — Patient Instructions (Signed)
° ° ° °  If you have lab work done today you will be contacted with your lab results within the next 2 weeks.  If you have not heard from us then please contact us. The fastest way to get your results is to register for My Chart. ° ° °IF you received an x-ray today, you will receive an invoice from [facility]. Please contact [facility] at [phone] with questions or concerns regarding your invoice.  ° °IF you received labwork today, you will receive an invoice from [facility]. Please contact [facility] at [phone] with questions or concerns regarding your invoice.  ° °Our billing staff will not be able to assist you with questions regarding bills from these companies. ° °You will be contacted with the lab results as soon as they are available. The fastest way to get your results is to activate your My Chart account. Instructions are located on the last page of this paperwork. If you have not heard from us regarding the results in 2 weeks, please contact this office. °  ° ° ° °

## 2019-09-16 ENCOUNTER — Encounter: Payer: Self-pay | Admitting: Family Medicine

## 2019-09-17 ENCOUNTER — Ambulatory Visit: Payer: Self-pay

## 2019-10-08 ENCOUNTER — Encounter: Payer: Self-pay | Admitting: Family Medicine

## 2019-10-08 ENCOUNTER — Other Ambulatory Visit: Payer: Self-pay

## 2019-10-08 ENCOUNTER — Ambulatory Visit (INDEPENDENT_AMBULATORY_CARE_PROVIDER_SITE_OTHER): Payer: Managed Care, Other (non HMO) | Admitting: Family Medicine

## 2019-10-08 VITALS — BP 124/85 | HR 96 | Temp 97.8°F | Resp 17 | Ht 70.0 in | Wt 280.4 lb

## 2019-10-08 DIAGNOSIS — E1159 Type 2 diabetes mellitus with other circulatory complications: Secondary | ICD-10-CM

## 2019-10-08 DIAGNOSIS — E785 Hyperlipidemia, unspecified: Secondary | ICD-10-CM

## 2019-10-08 DIAGNOSIS — E1165 Type 2 diabetes mellitus with hyperglycemia: Secondary | ICD-10-CM | POA: Diagnosis not present

## 2019-10-08 DIAGNOSIS — I10 Essential (primary) hypertension: Secondary | ICD-10-CM

## 2019-10-08 DIAGNOSIS — G4733 Obstructive sleep apnea (adult) (pediatric): Secondary | ICD-10-CM | POA: Diagnosis not present

## 2019-10-08 DIAGNOSIS — Z9989 Dependence on other enabling machines and devices: Secondary | ICD-10-CM

## 2019-10-08 DIAGNOSIS — I152 Hypertension secondary to endocrine disorders: Secondary | ICD-10-CM

## 2019-10-08 LAB — POCT GLYCOSYLATED HEMOGLOBIN (HGB A1C): Hemoglobin A1C: 11.6 % — AB (ref 4.0–5.6)

## 2019-10-08 MED ORDER — FREESTYLE LIBRE READER DEVI: 1.0000 | Freq: Three times a day (TID) | 0 refills | Status: AC

## 2019-10-08 NOTE — Patient Instructions (Signed)
° ° ° °  If you have lab work done today you will be contacted with your lab results within the next 2 weeks.  If you have not heard from us then please contact us. The fastest way to get your results is to register for My Chart. ° ° °IF you received an x-ray today, you will receive an invoice from [facility]. Please contact  Radiology at [phone] with questions or concerns regarding your invoice.  ° °IF you received labwork today, you will receive an invoice from [facility]. Please contact [facility] at [phone] with questions or concerns regarding your invoice.  ° °Our billing staff will not be able to assist you with questions regarding bills from these companies. ° °You will be contacted with the lab results as soon as they are available. The fastest way to get your results is to activate your My Chart account. Instructions are located on the last page of this paperwork. If you have not heard from us regarding the results in 2 weeks, please contact this office. °  ° ° ° °

## 2019-10-08 NOTE — Progress Notes (Signed)
Established Patient Office Visit  Subjective:  Patient ID: Johnny Maddox, male    DOB: 1956-04-09  Age: 63 y.o. MRN: [id number]  CC:  Chief Complaint  Patient presents with  . Diabetes    f/u    HPI Johnny Maddox presents for   Diabetes Mellitus: Patient presents for follow up of diabetes. Symptoms: polydipsia and polyuria, lower extremity edema. Symptoms have gradually worsened. Patient denies hypoglycemia .  Evaluation to date has been included: hemoglobin A1C.  Home sugars: patient does not check sugars.   Lab Results  Component Value Date   HGBA1C 11.6 (A) 10/08/2019    Ophthalmology: [facility],  not up to date Podiatry: [facility] in [place], not up to date Vaccines: up to date  His meds include metformin XR 123XX123 mg, Trulicity 1.5mg  weekly, humalog 12 units after every meal, atrovastatin 40mg  He is no longer on lisinopril. He is taking chlorthalidone and denies muscle cramps.  Hypertension: Patient here for follow-up of elevated blood pressure. He is not exercising and is adherent to low salt diet.  Blood pressure is well controlled at home. Cardiac symptoms lower extremity edema. Patient denies chest pain, chest pressure/discomfort, irregular heart beat and palpitations.  Cardiovascular risk factors: diabetes mellitus, dyslipidemia, hypertension, male gender, obesity (BMI >= 30 kg/m2) and sedentary lifestyle. Use of agents associated with hypertension: none. History of target organ damage: none. BP Readings from Last 3 Encounters:  10/08/19 124/85  09/09/19 (!) 165/92  08/04/19 (!) 144/88    The 10-year ASCVD risk score Mikey Bussing DC Brooke Bonito., et al., 2013) is: 23.6%   Values used to calculate the score:     Age: 49 years     Sex: Male     Is Non-Hispanic African American: Yes     Diabetic: Yes     Tobacco smoker: No     Systolic Blood Pressure: [id number] mmHg     Is BP treated: Yes     HDL Cholesterol: 51 mg/dL     Total Cholesterol: 157 mg/dL   Patient has been eating very irregular meals He restarted metformin a few weeks ago He does not exercise  Body mass index is 40.23 kg/m.  Wt Readings from Last 3 Encounters:  10/08/19 280 lb 6.4 oz (127.2 kg)  09/15/19 281 lb (127.5 kg)  09/09/19 282 lb (127.9 kg)    He is a stress eater  Depression screen [facility] 2/9 10/08/2019 08/04/2019 01/30/2018 01/22/2018 05/23/2017  Decreased Interest 0 0 0 0 0  Down, Depressed, Hopeless 0 0 0 0 0  PHQ - 2 Score 0 0 0 0 0   He has OSA but is not using CPAP He states that he has a mask and sometimes it mask and sometimes he feels like he cant breath in the middle of the night. His last sleep titration has been years  Past Medical History:  Diagnosis Date  . Acute laryngitis, without mention of obstruction   . Allergic rhinitis, cause unspecified   . Chalazion   . Cough   . Dermatophytosis of foot   . Diabetes mellitus without complication ([place])   . Elevated PSA   . Hyperlipidemia   . Hypertension   . Microalbuminuria   . Overweight   . Seborrheic dermatitis, unspecified   . Sleep apnea   . Unspecified disorder of autonomic nervous system   . Unspecified sleep apnea   . Unspecified venous (peripheral) insufficiency   . Unspecified vitamin D deficiency  Past Surgical History:  Procedure Laterality Date  . CIRCUMCISION    . HYDROCELE EXCISION Right 09/23/2016   Procedure: HYDROCELECTOMY ADULT ;  Surgeon: Hollice Espy, MD;  Location: [facility] ORS;  Service: Urology;  Laterality: Right;  INGUINAL APPROACH  . ROTATOR CUFF REPAIR  R 2011, L 2006   L and R shoulder  . TONSILLECTOMY AND ADENOIDECTOMY      Family History  Problem Relation Age of Onset  . Cancer Mother        leukemia  . Hypertension Mother   . Alcohol abuse Father   . Hypertension Brother   . Prostate cancer Neg Hx   . Bladder Cancer Neg Hx   . Kidney disease Neg Hx   . Kidney cancer Neg Hx     Social History   Socioeconomic History  . Marital status:  Divorced    Spouse name: n/a  . Number of children: 2  . Years of education: 26  . Highest education level: Not on file  Occupational History  . 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 Financial resource strain: Not on file  . Food insecurity    Worry: Not on file    Inability: Not on file  . Transportation needs    Medical: Not on file    Non-medical: Not on file  Tobacco Use  . Smoking status: Former Research scientist (life sciences)  . Smokeless tobacco: Never Used  . Tobacco comment: quit over 40 years ago  Substance and Sexual Activity  . Alcohol use: Yes    Alcohol/week: 2.0 standard drinks    Types: 2 Cans of beer per week    Comment: paranoia about alcholism; alcohol makes me sleepy.  . Drug use: No  . Sexual activity: Yes    Partners: Female    Birth control/protection: Condom  Lifestyle  . Physical activity    Days per week: Not on file    Minutes per session: Not on file  . Stress: Not on file  Relationships  . Social Herbalist on phone: Not on file    Gets together: Not on file    Attends religious service: Not on file    Active member of club or organization: Not on file    Attends meetings of clubs or organizations: Not on file    Relationship status: Not on file  . Intimate partner violence    Fear of current or ex partner: Not on file    Emotionally abused: Not on file    Physically abused: Not on file    Forced sexual activity: Not on file  Other Topics Concern  . Not on file  Social History Narrative   Lives alone. Adult son is in the [employer].  His daughter lives with her mother (his ex-wife).    Separated; after 6 years of marriage. Not dating.   Always uses seat belts.    Smoke alarm in the home.    No guns in the home.    Exercise: Moderate 3 x week, 20-30 minutes,walking, member of the [facility].   Caffeine use: Moderate.    Outpatient Medications Prior to Visit  Medication Sig Dispense Refill  . aspirin EC 81 MG tablet  Take 81 mg by mouth every evening.     Marland Kitchen atorvastatin (LIPITOR) 40 MG tablet Take 40 mg by mouth every evening.    . Continuous Blood Gluc Sensor (FREESTYLE LIBRE 14 DAY SENSOR) MISC USE TO TEST BLOOD  SUGAR, CHANGING EVERY 14 DAYS  12  . Dulaglutide (TRULICITY) 1.5 0000000 SOPN Inject 1.5 mg into the skin every Saturday.     . finasteride (PROSCAR) 5 MG tablet Take 1 tablet (5 mg total) by mouth daily. 90 tablet 3  . furosemide (LASIX) 20 MG tablet Take 20 mg by mouth daily.  3  . Insulin Glargine (LANTUS SOLOSTAR) 100 UNIT/ML Solostar Pen Inject 22 Units into the skin at bedtime.    Marland Kitchen lisinopril (ZESTRIL) 20 MG tablet Take 20 mg by mouth daily.    . metFORMIN (GLUCOPHAGE-XR) 500 MG 24 hr tablet TAKE 2 TABLETS (1,000 MG TOTAL) BY MOUTH DAILY WITH DINNER.  5  . Multiple Vitamin (MULTI-VITAMINS) TABS Take by mouth.    . sildenafil (REVATIO) 20 MG tablet Take 3 to 5 tablets two hours before intercouse on an empty stomach.  Do not take with nitrates. 90 tablet 3  . tadalafil (CIALIS) 20 MG tablet Take 1 tablet (20 mg total) by mouth daily as needed for erectile dysfunction. 30 tablet 6  . Continuous Blood Gluc Receiver (FREESTYLE LIBRE READER) DEVI Use 1 Units as directed.    Marland Kitchen HUMALOG KWIKPEN 100 UNIT/ML KiwkPen INJECT 12 UNITS SUBCUTANEOUSLY DAILY AT 7 AM.  12  . chlorthalidone (HYGROTON) 25 MG tablet TAKE 1 TABLET BY MOUTH EVERY DAY (Patient not taking: Reported on 09/15/2019) 30 tablet 3   No facility-administered medications prior to visit.     Allergies  Allergen Reactions  . Other Anaphylaxis    Mushrooms  . Seasonal Ic [Cholestatin]     ROS Review of Systems Review of Systems  Constitutional: Negative for activity change, appetite change, chills and fever.  HENT: Negative for congestion, nosebleeds, trouble swallowing and voice change.   Respiratory: Negative for cough, shortness of breath and wheezing.   Gastrointestinal: Negative for diarrhea, nausea and vomiting.   Genitourinary: Negative for difficulty urinating, dysuria, flank pain and hematuria.  Musculoskeletal: Negative for back pain, joint swelling and neck pain.  Neurological: Negative for dizziness, speech difficulty, light-headedness and numbness.  See HPI. All other review of systems negative.     Objective:    Physical Exam  BP 124/85 (BP Location: Right Arm, Patient Position: Sitting, Cuff Size: Large)   Pulse 96   Temp 97.8 F (36.6 C) (Oral)   Resp 17   Ht 5\' 10"  (1.778 m)   Wt 280 lb 6.4 oz (127.2 kg)   SpO2 97%   BMI 40.23 kg/m  Wt Readings from Last 3 Encounters:  10/08/19 280 lb 6.4 oz (127.2 kg)  09/15/19 281 lb (127.5 kg)  09/09/19 282 lb (127.9 kg)   Physical Exam  Constitutional: Oriented to person, place, and time. Appears well-developed and well-nourished.  HENT:  Head: Normocephalic and atraumatic.  Eyes: Conjunctivae and EOM are normal.  Cardiovascular: Normal rate, regular rhythm, normal heart sounds and intact distal pulses.  No murmur heard. Pulmonary/Chest: Effort normal and breath sounds normal. No stridor. No respiratory distress. Has no wheezes.  Neurological: Is alert and oriented to person, place, and time.  Extremity: trace edema bilaterally Skin: Skin is warm. Capillary refill takes less than 2 seconds.  Psychiatric: Has a normal mood and affect. Behavior is normal. Judgment and thought content normal.    Health Maintenance Due  Topic Date Due  . HEMOGLOBIN A1C  11/23/2017  . OPHTHALMOLOGY EXAM  12/31/2018    There are no preventive care reminders to display for this patient.  Lab Results  Component Value Date  TSH 1.550 05/23/2017   Lab Results  Component Value Date   WBC 8.0 01/28/2018   HGB 14.5 01/28/2018   HCT 44.6 01/28/2018   MCV 84.5 01/28/2018   PLT 152 12/28/2012   Lab Results  Component Value Date   NA 140 01/28/2018   K 4.3 01/28/2018   CO2 21 01/28/2018   GLUCOSE 83 01/28/2018   BUN 17 01/28/2018   CREATININE  1.10 01/28/2018   BILITOT 0.3 05/23/2017   ALKPHOS 47 05/23/2017   AST 22 05/23/2017   ALT 35 05/23/2017   PROT 6.6 05/23/2017   ALBUMIN 4.5 01/22/2018   CALCIUM 10.3 (H) 01/28/2018   ANIONGAP 11 09/09/2016   Lab Results  Component Value Date   CHOL 177 05/23/2017   Lab Results  Component Value Date   HDL 55 05/23/2017   Lab Results  Component Value Date   LDLCALC 99 05/23/2017   Lab Results  Component Value Date   TRIG 115 05/23/2017   Lab Results  Component Value Date   CHOLHDL 3.2 05/23/2017   Lab Results  Component Value Date   HGBA1C 11.6 (A) 10/08/2019      Assessment & Plan:   Problem List Items Addressed This Visit      Cardiovascular and Mediastinum   Hypertension associated with diabetes ([place])   Relevant Medications   Insulin Glargine (LANTUS SOLOSTAR) 100 UNIT/ML Solostar Pen   Other Relevant Orders   CMET with GFR   Ambulatory referral to Sleep Studies     Other   Dyslipidemia   Relevant Orders   Lipid panel    Other Visit Diagnoses    Uncontrolled type 2 diabetes mellitus with hyperglycemia ([place])    -  Primary   Relevant Medications   Insulin Glargine (LANTUS SOLOSTAR) 100 UNIT/ML Solostar Pen   Other Relevant Orders   POCT glycosylated hemoglobin (Hb A1C) (Completed)   CMET with GFR   OSA on CPAP       Relevant Orders   Ambulatory referral to Sleep Studies   Morbid obesity ([place])       Relevant Medications   Insulin Glargine (LANTUS SOLOSTAR) 100 UNIT/ML Solostar Pen     DIABETES Discussed with patient that the first medication is his food. He has to eat a consistent diet. Reviewed Dr. Sherren Mocha last note and last a1c which was also 11.3% October 2020. He was instructed to resume his metformin XR 2000mg  and increase the Basaglar to 22 units and also to increase the lisinopril to 40mg  which he has not been doing.  The patient is also advised that his meal time humalog of 12 units should be adjusted based on his meal. If his meal is  noted to be very heavy with starch then he may need more meal time insulin. Discussed that if he eats a smaller meal then he should decrease his insulin but that consistency in his meals will be important.  Hypertension His blood pressure is being managed by Furosemide and Lisinopril Reviewed his meds and reconciled those meds from outside pharmacy.  OSA on CPAP Discussed that he should establish with a sleep clinic as he needs CPAP titration Discussed that his cpap probably needs to be adjusted and that OSA that is untreated worsens morbid and mortality.    Meds ordered this encounter  Medications  . Continuous Blood Gluc Receiver (FREESTYLE LIBRE READER) DEVI    Sig: 1 Device by Other route 3 (three) times daily. E11.65    Dispense:  1 each  Refill:  0    Follow-up: Return in about 6 weeks (around 11/19/2019) for virtual visit to check on sugars.   A total of 40 minutes were spent face-to-face with the patient during this encounter and over half of that time was spent on counseling and coordination of care.  [name], MD

## 2019-10-09 LAB — CMP14+EGFR
ALT: 25 IU/L (ref 0–44)
AST: 20 IU/L (ref 0–40)
Albumin/Globulin Ratio: 2.6 — ABNORMAL HIGH (ref 1.2–2.2)
Albumin: 4.7 g/dL (ref 3.8–4.8)
Alkaline Phosphatase: 56 IU/L (ref 39–117)
BUN/Creatinine Ratio: 17 (ref 10–24)
BUN: 18 mg/dL (ref 8–27)
Bilirubin Total: 0.4 mg/dL (ref 0.0–1.2)
CO2: 19 mmol/L — ABNORMAL LOW (ref 20–29)
Calcium: 9.7 mg/dL (ref 8.6–10.2)
Chloride: 105 mmol/L (ref 96–106)
Creatinine, Ser: 1.08 mg/dL (ref 0.76–1.27)
GFR calc Af Amer: 84 mL/min/{1.73_m2} (ref >59)
GFR calc non Af Amer: 73 mL/min/{1.73_m2} (ref >59)
Globulin, Total: 1.8 g/dL (ref 1.5–4.5)
Glucose: 244 mg/dL — ABNORMAL HIGH (ref 65–99)
Potassium: 4.2 mmol/L (ref 3.5–5.2)
Sodium: 140 mmol/L (ref 134–144)
Total Protein: 6.5 g/dL (ref 6.0–8.5)

## 2019-10-09 LAB — LIPID PANEL
Chol/HDL Ratio: 4 ratio (ref 0.0–5.0)
Cholesterol, Total: 192 mg/dL (ref 100–199)
HDL: 48 mg/dL (ref >39)
LDL Chol Calc (NIH): 125 mg/dL — ABNORMAL HIGH (ref 0–99)
Triglycerides: 105 mg/dL (ref 0–149)
VLDL Cholesterol Cal: 19 mg/dL (ref 5–40)

## 2019-10-09 NOTE — Progress Notes (Signed)
No show

## 2019-10-14 ENCOUNTER — Other Ambulatory Visit: Payer: Self-pay | Admitting: *Deleted

## 2019-10-14 DIAGNOSIS — R972 Elevated prostate specific antigen [PSA]: Secondary | ICD-10-CM

## 2019-10-15 ENCOUNTER — Encounter: Payer: Self-pay | Admitting: Urology

## 2019-10-15 ENCOUNTER — Other Ambulatory Visit: Payer: Self-pay

## 2019-10-21 ENCOUNTER — Other Ambulatory Visit: Payer: Self-pay | Admitting: Family Medicine

## 2019-10-21 NOTE — Telephone Encounter (Signed)
Pt requesting refill on  Medication for yeast infection  Needs urgently   Preferred Pharmacy  [facility] [place], [place]

## 2019-10-21 NOTE — Telephone Encounter (Signed)
Pt states it is burning and it is in his penis.  Will try to contact office.  Pt states he has had this before, he is secreting so much sugar, it is pushing out in his urine. No answer at the office, will route to pcp.

## 2019-10-21 NOTE — Telephone Encounter (Signed)
Where is the yeast infection? What symptoms is he describing?

## 2019-10-21 NOTE — Telephone Encounter (Signed)
Please advise 

## 2019-10-22 ENCOUNTER — Encounter: Payer: Self-pay | Admitting: Family Medicine

## 2019-10-22 NOTE — Telephone Encounter (Signed)
Pt is in pain and needing this refill asap .

## 2019-10-25 ENCOUNTER — Other Ambulatory Visit: Payer: Self-pay

## 2019-10-25 ENCOUNTER — Encounter: Payer: Self-pay | Admitting: Podiatry

## 2019-10-25 ENCOUNTER — Ambulatory Visit (INDEPENDENT_AMBULATORY_CARE_PROVIDER_SITE_OTHER): Payer: Managed Care, Other (non HMO) | Admitting: Podiatry

## 2019-10-25 DIAGNOSIS — M79674 Pain in right toe(s): Secondary | ICD-10-CM

## 2019-10-25 DIAGNOSIS — B351 Tinea unguium: Secondary | ICD-10-CM | POA: Diagnosis not present

## 2019-10-25 DIAGNOSIS — E119 Type 2 diabetes mellitus without complications: Secondary | ICD-10-CM

## 2019-10-25 DIAGNOSIS — M79675 Pain in left toe(s): Secondary | ICD-10-CM | POA: Diagnosis not present

## 2019-10-25 DIAGNOSIS — M205X9 Other deformities of toe(s) (acquired), unspecified foot: Secondary | ICD-10-CM | POA: Diagnosis not present

## 2019-10-25 NOTE — Progress Notes (Signed)
Complaint:  Visit Type: Patient returns to my office for continued preventative foot care services. Complaint: Patient states" my nails have grown long and thick and become painful to walk and wear shoes" Patient has been diagnosed with DM with no foot complications. The patient presents for preventative foot care services. No changes to ROS.  Patient is diabetic with no diabetic complications.  Podiatric Exam: Vascular: dorsalis pedis and posterior tibial pulses are palpable bilateral. Capillary return is immediate. Temperature gradient is WNL. Skin turgor WNL  Sensorium: Normal Semmes Weinstein monofilament test. Normal tactile sensation bilaterally. Nail Exam: Pt has thick disfigured discolored nails with subungual debris noted bilateral entire nail hallux through fifth toenails Ulcer Exam: There is no evidence of ulcer or pre-ulcerative changes or infection. Orthopedic Exam: Muscle tone and strength are WNL. No limitations in general ROM. No crepitus or effusions noted. Foot type and digits show no abnormalities. Bony prominences are unremarkable. Skin: No Porokeratosis. No infection or ulcers  Diagnosis:  Onychomycosis, , Pain in right toe, pain in left toes  Treatment & Plan Procedures and Treatment: Consent by patient was obtained for treatment procedures.   Debridement of mycotic and hypertrophic toenails, 1 through 5 bilateral and clearing of subungual debris. No ulceration, no infection noted. Consider nail surgery for removal second toenail right foot. Return Visit-Office Procedure: Patient instructed to return to the office for a follow up visit 4 months for continued evaluation and treatment.      Gardiner Barefoot DPM

## 2019-10-28 ENCOUNTER — Telehealth: Payer: Self-pay | Admitting: Urology

## 2019-10-28 NOTE — Telephone Encounter (Signed)
Would you call Johnny Maddox and remind him that we need to have his PSA rechecked?

## 2019-10-28 NOTE — Telephone Encounter (Signed)
Patient notified and appointment has been made.  

## 2019-10-29 ENCOUNTER — Other Ambulatory Visit: Payer: Managed Care, Other (non HMO)

## 2019-10-29 ENCOUNTER — Other Ambulatory Visit: Payer: Self-pay

## 2019-10-29 DIAGNOSIS — R972 Elevated prostate specific antigen [PSA]: Secondary | ICD-10-CM

## 2019-10-30 LAB — PSA: Prostate Specific Ag, Serum: 3.9 ng/mL (ref 0.0–4.0)

## 2019-11-02 ENCOUNTER — Institutional Professional Consult (permissible substitution): Payer: Managed Care, Other (non HMO) | Admitting: Neurology

## 2019-11-03 ENCOUNTER — Other Ambulatory Visit: Payer: Self-pay | Admitting: Family Medicine

## 2019-11-03 MED ORDER — FLUCONAZOLE 200 MG PO TABS: ORAL_TABLET | ORAL | 0 refills | Status: DC

## 2019-11-03 MED ORDER — NYSTATIN-TRIAMCINOLONE 100000-0.1 UNIT/GM-% EX OINT: 1.0000 "application " | TOPICAL_OINTMENT | Freq: Two times a day (BID) | CUTANEOUS | 0 refills | Status: DC

## 2019-11-03 NOTE — Telephone Encounter (Signed)
I have called the pt and informed him the cream was sent. He stated that he wanted the pill. I have informed the provider and she is going to send the pill, however I did tell the pt to get the cream and use that as needed as well. He stated understanding.

## 2019-11-18 ENCOUNTER — Ambulatory Visit: Payer: Managed Care, Other (non HMO) | Attending: Internal Medicine

## 2019-11-18 DIAGNOSIS — Z20822 Contact with and (suspected) exposure to covid-19: Secondary | ICD-10-CM

## 2019-11-19 ENCOUNTER — Encounter: Payer: Managed Care, Other (non HMO) | Admitting: Family Medicine

## 2019-11-19 ENCOUNTER — Other Ambulatory Visit: Payer: Self-pay

## 2019-11-19 ENCOUNTER — Encounter: Payer: Self-pay | Admitting: Family Medicine

## 2019-11-19 NOTE — Patient Instructions (Signed)
° ° ° °  If you have lab work done today you will be contacted with your lab results within the next 2 weeks.  If you have not heard from us then please contact us. The fastest way to get your results is to register for My Chart. ° ° °IF you received an x-ray today, you will receive an invoice from [facility]. Please contact [facility] at [phone] with questions or concerns regarding your invoice.  ° °IF you received labwork today, you will receive an invoice from [facility]. Please contact [facility] at [phone] with questions or concerns regarding your invoice.  ° °Our billing staff will not be able to assist you with questions regarding bills from these companies. ° °You will be contacted with the lab results as soon as they are available. The fastest way to get your results is to activate your My Chart account. Instructions are located on the last page of this paperwork. If you have not heard from us regarding the results in 2 weeks, please contact this office. °  ° ° ° °

## 2019-11-19 NOTE — Progress Notes (Signed)
Sugars have been good so far.   DM f/u & med refill.   No insurance right now, currently layed off from work

## 2019-11-20 ENCOUNTER — Telehealth: Payer: Self-pay

## 2019-11-20 NOTE — Telephone Encounter (Signed)
Pt called for covid results advised that results are not back 

## 2019-11-21 LAB — NOVEL CORONAVIRUS, NAA: SARS-CoV-2, NAA: NOT DETECTED

## 2019-11-22 ENCOUNTER — Encounter: Payer: Self-pay | Admitting: Family Medicine

## 2019-11-22 ENCOUNTER — Encounter (INDEPENDENT_AMBULATORY_CARE_PROVIDER_SITE_OTHER): Payer: Managed Care, Other (non HMO) | Admitting: Ophthalmology

## 2019-12-13 ENCOUNTER — Ambulatory Visit: Payer: Managed Care, Other (non HMO) | Attending: Internal Medicine

## 2019-12-13 DIAGNOSIS — Z20822 Contact with and (suspected) exposure to covid-19: Secondary | ICD-10-CM | POA: Insufficient documentation

## 2019-12-14 LAB — NOVEL CORONAVIRUS, NAA: SARS-CoV-2, NAA: NOT DETECTED

## 2020-02-21 ENCOUNTER — Ambulatory Visit: Payer: Managed Care, Other (non HMO) | Admitting: Podiatry

## 2020-03-02 ENCOUNTER — Other Ambulatory Visit: Payer: 59

## 2020-03-02 ENCOUNTER — Encounter: Payer: Self-pay | Admitting: Urology

## 2020-03-02 ENCOUNTER — Other Ambulatory Visit: Payer: Self-pay

## 2020-03-02 DIAGNOSIS — R972 Elevated prostate specific antigen [PSA]: Secondary | ICD-10-CM

## 2020-03-09 ENCOUNTER — Ambulatory Visit: Payer: 59 | Admitting: Urology

## 2020-04-12 ENCOUNTER — Telehealth: Payer: Self-pay | Admitting: Urology

## 2020-04-12 NOTE — Telephone Encounter (Signed)
Would you call Mr. Gorelik and have him reschedule his missed appointment for elevated PSA?

## 2020-04-14 NOTE — Telephone Encounter (Signed)
LMOM to return call to reschedule missed appointment. °

## 2020-04-18 NOTE — Telephone Encounter (Signed)
LMOM to return call.

## 2020-04-20 ENCOUNTER — Encounter: Payer: Self-pay | Admitting: Urology

## 2020-06-18 ENCOUNTER — Encounter: Payer: Self-pay | Admitting: Urology

## 2020-08-08 ENCOUNTER — Other Ambulatory Visit: Payer: Self-pay

## 2020-08-10 ENCOUNTER — Ambulatory Visit: Payer: Self-pay | Admitting: Urology

## 2020-08-11 ENCOUNTER — Other Ambulatory Visit: Payer: Self-pay

## 2020-08-11 DIAGNOSIS — R972 Elevated prostate specific antigen [PSA]: Secondary | ICD-10-CM

## 2020-08-14 ENCOUNTER — Other Ambulatory Visit: Payer: Managed Care, Other (non HMO)

## 2020-08-14 ENCOUNTER — Other Ambulatory Visit: Payer: Self-pay

## 2020-08-14 DIAGNOSIS — R972 Elevated prostate specific antigen [PSA]: Secondary | ICD-10-CM

## 2020-08-14 NOTE — Progress Notes (Signed)
08/15/2020 9:36 PM   [name] [id number]  Referring provider: [name], MD [phone] [address] [place],  [place] [address]  Chief Complaint  Patient presents with  . Follow-up    HPI:  64 yo M with history of elevated PSA, ED, BPH and  left intratesticular neoplasm.  Elevated PSA Personal history of elevated PSA.  PSA trend as below. His fluctuations may be due to his symptoms inconsistent use of finasteride.   Component     Latest Ref Rng & Units 10/11/2015 04/03/2016 04/10/2016 10/11/2016  Prostate Specific Ag, Serum     0.0 - 4.0 ng/mL 2.5 4.4 (H) 4.0 6.2 (H)   Component     Latest Ref Rng & Units 02/04/2017 05/23/2017 12/22/2017 05/29/2018  Prostate Specific Ag, Serum     0.0 - 4.0 ng/mL 3.7 3.4 3.3 4.7 (H)   Component     Latest Ref Rng & Units 09/09/2019 09/14/2019 10/29/2019  Prostate Specific Ag, Serum     0.0 - 4.0 ng/mL 6.5 (H) 5.3 (H) 3.9    Today's PSA pending  Erectile dysfunction SHIM score: 10 Previous SHIM score: 5 Main complaint: Achieving erections  x 6 years Risk factors:  age, BPH, DM and HTN, HLD and sleep apnea.     No painful erections or curvatures with his erections.    Still having having spontaneous erections Tried: PDE5i's and Trimix - sildenafil gave a headache    SHIM    Row Name 08/15/20 1432         SHIM: Over the last 6 months:   How do you rate your confidence that you could get and keep an erection? Low     When you had erections with sexual stimulation, how often were your erections hard enough for penetration (entering your partner)? A Few Times (much less than half the time)     During sexual intercourse, how often were you able to maintain your erection after you had penetrated (entered) your partner? Sometimes (about half the time)     During sexual intercourse, how difficult was it to maintain your erection to completion of intercourse? Very Difficult     When you attempted sexual  intercourse, how often was it satisfactory for you? Almost Never or Never       SHIM Total Score   SHIM 10            Score: 1-7 Severe ED 8-11 Moderate ED 12-16 Mild-Moderate ED 17-21 Mild ED 22-25 No ED  BPH WITH LUTS  (prostate and/or bladder) IPSS score: 8/6    PVR: 0 mL    Previous score: 9/3  Previous PVR: 27 mL  Major complaint(s): Frequency and urgency.  Patient denies any modifying or aggravating factors.  Patient denies any gross hematuria, dysuria or suprapubic/flank pain.  Patient denies any fevers, chills, nausea or vomiting.   Currently taking: finasteride 5 mg daily.    He does not have a family history of PCa.  His UA is benign.     IPSS    Row Name 08/15/20 1400         International Prostate Symptom Score   How often have you had the sensation of not emptying your bladder? Not at All     How often have you had to urinate less than every two hours? About half the time     How often have you found you stopped and started again several times  when you urinated? Not at All     How often have you found it difficult to postpone urination? Less than half the time     How often have you had a weak urinary stream? Not at All     How often have you had to strain to start urination? Not at All     How many times did you typically get up at night to urinate? 3 Times     Total IPSS Score 8       Quality of Life due to urinary symptoms   If you were to spend the rest of your life with your urinary condition just the way it is now how would you feel about that? Terrible            Score:  1-7 Mild 8-19 Moderate 20-35 Severe  Left intratesticular mass Incidental identified left intratesticular 5 x 4 x 4 mm hypoechoic mass within the left testicle which was not previously appreciated on scrotal ultrasound 05/2015 This was stable from scrotal ultrasound performed on 08/13/2016 to 09/16/2016, 11/22/2016, and 05/12/17.   Most recent scrotal ultrasound 05/2018 shows  stable 4 mm left testicular nodule mass.  He has been performing regular self testicular exams without change.  Repeat scrotal ultrasound is pending.   PMH: Past Medical History:  Diagnosis Date  . Acute laryngitis, without mention of obstruction   . Allergic rhinitis, cause unspecified   . Chalazion   . Cough   . Dermatophytosis of foot   . Diabetes mellitus without complication ([place])   . Elevated PSA   . Hyperlipidemia   . Hypertension   . Microalbuminuria   . Overweight   . Seborrheic dermatitis, unspecified   . Sleep apnea   . Unspecified disorder of autonomic nervous system   . Unspecified sleep apnea   . Unspecified venous (peripheral) insufficiency   . Unspecified vitamin D deficiency     Surgical History: Past Surgical History:  Procedure Laterality Date  . CIRCUMCISION    . HYDROCELE EXCISION Right 09/23/2016   Procedure: HYDROCELECTOMY ADULT ;  Surgeon: Hollice Espy, MD;  Location: [facility] ORS;  Service: Urology;  Laterality: Right;  INGUINAL APPROACH  . ROTATOR CUFF REPAIR  R 2011, L 2006   L and R shoulder  . TONSILLECTOMY AND ADENOIDECTOMY      Home Medications:  Allergies as of 08/15/2020      Reactions   Other Anaphylaxis   Mushrooms   Seasonal Ic [cholestatin]       Medication List       Accurate as of August 15, 2020 11:59 PM. If you have any questions, ask your nurse or doctor.        STOP taking these medications   sildenafil 20 MG tablet Commonly known as: REVATIO Stopped by: Noor Witte, PA-C     TAKE these medications   aspirin EC 81 MG tablet Take 81 mg by mouth every evening.   atorvastatin 40 MG tablet Commonly known as: LIPITOR Take 40 mg by mouth every evening.   B-D ULTRAFINE III SHORT PEN 31G X 8 MM Misc Generic drug: Insulin Pen Needle Use 4 (four) times daily   finasteride 5 MG tablet Commonly known as: PROSCAR Take 1 tablet (5 mg total) by mouth daily.   fluconazole 200 MG tablet Commonly known as:  DIFLUCAN Take one tablet by mouth once a week for 4 weeks   FreeStyle Libre 14 Day Sensor Misc USE TO TEST BLOOD SUGAR, CHANGING  EVERY 14 DAYS   FreeStyle Libre Reader Devi 1 Device by Other route 3 (three) times daily. E11.65   furosemide 20 MG tablet Commonly known as: LASIX Take 20 mg by mouth daily.   HumaLOG KwikPen 100 UNIT/ML KwikPen Generic drug: insulin lispro INJECT 12 UNITS SUBCUTANEOUSLY DAILY AT 7 AM.   Lantus SoloStar 100 UNIT/ML Solostar Pen Generic drug: insulin glargine Inject 22 Units into the skin at bedtime.   lisinopril 20 MG tablet Commonly known as: ZESTRIL Take 20 mg by mouth daily.   metFORMIN 500 MG 24 hr tablet Commonly known as: GLUCOPHAGE-XR TAKE 2 TABLETS (1,000 MG TOTAL) BY MOUTH DAILY WITH DINNER.   Multi-Vitamins Tabs Take by mouth.   nystatin cream Commonly known as: MYCOSTATIN Apply topically.   nystatin-triamcinolone ointment Commonly known as: MYCOLOG Apply 1 application topically 2 (two) times daily.   OneTouch Verio test strip Generic drug: glucose blood Use 1 strip via meter three times daily as directed.   tadalafil 20 MG tablet Commonly known as: CIALIS Take 1 tablet (20 mg total) by mouth daily as needed for erectile dysfunction.   Trulicity 1.5 KD/9.8PJ Sopn Generic drug: Dulaglutide Inject 1.5 mg into the skin every Saturday.       Allergies:  Allergies  Allergen Reactions  . Other Anaphylaxis    Mushrooms  . Seasonal Ic [Cholestatin]     Family History: Family History  Problem Relation Age of Onset  . Cancer Mother        leukemia  . Hypertension Mother   . Alcohol abuse Father   . Hypertension Brother   . Prostate cancer Neg Hx   . Bladder Cancer Neg Hx   . Kidney disease Neg Hx   . Kidney cancer Neg Hx     Social History:  reports that he has quit smoking. He has never used smokeless tobacco. He reports current alcohol use of about 2.0 standard drinks of alcohol per week. He reports that he  does not use drugs.  ROS: For pertinent review of systems please refer to history of present illness  Physical Exam: BP (!) 155/84   Pulse 93   Ht 5\' 10"  (1.778 m)   Wt 283 lb 14.4 oz (128.8 kg)   BMI 40.74 kg/m   Constitutional:  Well nourished. Alert and oriented, No acute distress. HEENT: [place] AT, mask in place.  Trachea midline Cardiovascular: No clubbing, cyanosis, or edema. Respiratory: Normal respiratory effort, no increased work of breathing. GU: No CVA tenderness.  No bladder fullness or masses.  Patient with uncircumcised phallus.  Foreskin easily retracted  Urethral meatus is patent.  No penile discharge. No penile lesions or rashes. Scrotum without lesions, cysts, rashes and/or edema.  Testicles are located scrotally bilaterally. No masses are appreciated in the testicles. Left and right epididymis are normal. Rectal: Patient with  normal sphincter tone. Anus and perineum without scarring or rashes. No rectal masses are appreciated. Prostate is approximately 50 + grams, could only palpate the apex and midportion of gland, no nodules are appreciated. Seminal vesicles could not be palpated  Skin: No rashes, bruises or suspicious lesions. Lymph: No inguinal adenopathy. Neurologic: Grossly intact, no focal deficits, moving all 4 extremities. Psychiatric: Normal mood and affect.   Laboratory Data: Lab Results  Component Value Date   WBC 8.0 01/28/2018   HGB 14.5 01/28/2018   HCT 44.6 01/28/2018   MCV 84.5 01/28/2018   PLT 152 12/28/2012    Lab Results  Component Value Date  CREATININE 1.08 10/08/2019    Lab Results  Component Value Date   HGBA1C 11.6 (A) 10/08/2019   Urinalysis Component     Latest Ref Rng & Units 08/15/2020          Specific Gravity, UA     1.005 - 1.030 1.020  pH, UA     5.0 - 7.5 5.5  Color, UA     Yellow Yellow  Appearance Ur     Clear Clear  Leukocytes,UA     Negative Negative  Protein,UA     Negative/Trace Negative  Glucose, UA      Negative 2+ (A)  Ketones, UA     Negative Negative  RBC, UA     Negative Negative  Bilirubin, UA     Negative Negative  Urobilinogen, Ur     0.2 - 1.0 mg/dL 1.0  Nitrite, UA     Negative Negative  Microscopic Examination      See below:   Component     Latest Ref Rng & Units 08/15/2020  WBC, UA     0 - 5 /hpf 0-5  RBC     0 - 2 /hpf None seen  Epithelial Cells (non renal)     0 - 10 /hpf 0-10  Bacteria, UA     None seen/Few None seen    I have reviewed the labs.  Pertinent Imaging: Results for ELON, EOFF (MRN [id number]) as of 08/15/2020 14:58  Ref. Range 08/15/2020 14:09  Scan Result Unknown 0    Assessment & Plan:    1. Testicular mass Repeat scrotal ultrasound ordered  Patient will continue monthly self testicular exams He is agreeable this plan  2. Elevated PSA History of fluctuating PSA PSA pending  3. BPH with LUTS IPSS score is 8/6, it is worsening Continue conservative management, avoiding bladder irritants and timed voiding's Most bothersome symptoms was frequency that lasted about two weeks and then abated  Continue finasteride 5 mg daily - refills given RTC in 6 months for IPSS, PSA and exam - if PSA is stable  4. Erectile dysfunction SHIM score is 10, it is improved tadalafil 20 mg - [facility] sent to [facility] in 6 months for repeat SHIM score and exam    Return in about 6 months (around 02/13/2021) for IPSS, SHIM, PSA and exam.  Zara Council, [facility]  [place] [address] [place], [place] [address] [phone]

## 2020-08-15 ENCOUNTER — Other Ambulatory Visit: Payer: Self-pay

## 2020-08-15 ENCOUNTER — Encounter: Payer: Self-pay | Admitting: Urology

## 2020-08-15 ENCOUNTER — Ambulatory Visit: Payer: Managed Care, Other (non HMO) | Admitting: Urology

## 2020-08-15 VITALS — BP 155/84 | HR 93 | Ht 70.0 in | Wt 283.9 lb

## 2020-08-15 DIAGNOSIS — N138 Other obstructive and reflux uropathy: Secondary | ICD-10-CM

## 2020-08-15 DIAGNOSIS — N529 Male erectile dysfunction, unspecified: Secondary | ICD-10-CM | POA: Diagnosis not present

## 2020-08-15 DIAGNOSIS — N401 Enlarged prostate with lower urinary tract symptoms: Secondary | ICD-10-CM

## 2020-08-15 DIAGNOSIS — R972 Elevated prostate specific antigen [PSA]: Secondary | ICD-10-CM | POA: Diagnosis not present

## 2020-08-15 DIAGNOSIS — D4959 Neoplasm of unspecified behavior of other genitourinary organ: Secondary | ICD-10-CM

## 2020-08-15 LAB — MICROSCOPIC EXAMINATION
Bacteria, UA: NONE SEEN
RBC, Urine: NONE SEEN /hpf (ref 0–2)

## 2020-08-15 LAB — URINALYSIS, COMPLETE
Bilirubin, UA: NEGATIVE
Ketones, UA: NEGATIVE
Leukocytes,UA: NEGATIVE
Nitrite, UA: NEGATIVE
Protein,UA: NEGATIVE
RBC, UA: NEGATIVE
Specific Gravity, UA: 1.02 (ref 1.005–1.030)
Urobilinogen, Ur: 1 mg/dL (ref 0.2–1.0)
pH, UA: 5.5 (ref 5.0–7.5)

## 2020-08-15 LAB — BLADDER SCAN AMB NON-IMAGING: Scan Result: 0

## 2020-08-15 MED ORDER — FINASTERIDE 5 MG PO TABS: 5.0000 mg | ORAL_TABLET | Freq: Every day | ORAL | 3 refills | Status: DC

## 2020-08-15 MED ORDER — TADALAFIL 20 MG PO TABS: 20.0000 mg | ORAL_TABLET | Freq: Every day | ORAL | 6 refills | Status: DC | PRN

## 2020-08-16 ENCOUNTER — Telehealth: Payer: Self-pay | Admitting: Family Medicine

## 2020-08-16 LAB — PSA: Prostate Specific Ag, Serum: 4.2 ng/mL — ABNORMAL HIGH (ref 0.0–4.0)

## 2020-08-16 NOTE — Telephone Encounter (Signed)
Patient notified and voiced understanding. Appointment for lab made. 

## 2020-08-16 NOTE — Telephone Encounter (Signed)
-----   Message from Nori Riis, PA-C sent at 08/16/2020  9:39 AM EDT ----- Please let Mr. Acree know that his PSA has increased a bit, so I would like to have it repeated again in three months.

## 2020-08-24 ENCOUNTER — Other Ambulatory Visit: Payer: Self-pay

## 2020-08-24 ENCOUNTER — Ambulatory Visit
Admission: RE | Admit: 2020-08-24 | Discharge: 2020-08-24 | Disposition: A | Discharge status: Home / Self Care | Payer: Managed Care, Other (non HMO) | Source: Ambulatory Visit | Attending: Urology | Admitting: Urology

## 2020-08-24 DIAGNOSIS — D4959 Neoplasm of unspecified behavior of other genitourinary organ: Secondary | ICD-10-CM

## 2020-08-24 DIAGNOSIS — D4012 Neoplasm of uncertain behavior of left testis: Secondary | ICD-10-CM | POA: Insufficient documentation

## 2020-08-24 DIAGNOSIS — D4010 Neoplasm of uncertain behavior of unspecified testis: Secondary | ICD-10-CM | POA: Diagnosis present

## 2020-11-02 ENCOUNTER — Encounter (INDEPENDENT_AMBULATORY_CARE_PROVIDER_SITE_OTHER): Payer: Managed Care, Other (non HMO) | Admitting: Ophthalmology

## 2020-11-06 ENCOUNTER — Encounter: Payer: Self-pay | Admitting: Registered Nurse

## 2020-11-06 ENCOUNTER — Other Ambulatory Visit: Payer: Self-pay

## 2020-11-06 ENCOUNTER — Ambulatory Visit: Payer: Managed Care, Other (non HMO) | Admitting: Registered Nurse

## 2020-11-06 ENCOUNTER — Ambulatory Visit: Payer: Managed Care, Other (non HMO) | Admitting: Podiatry

## 2020-11-06 ENCOUNTER — Encounter: Payer: Self-pay | Admitting: Podiatry

## 2020-11-06 VITALS — BP 130/78 | HR 81 | Temp 97.9°F | Resp 18 | Ht 70.0 in | Wt 283.6 lb

## 2020-11-06 DIAGNOSIS — E1165 Type 2 diabetes mellitus with hyperglycemia: Secondary | ICD-10-CM

## 2020-11-06 DIAGNOSIS — E119 Type 2 diabetes mellitus without complications: Secondary | ICD-10-CM | POA: Diagnosis not present

## 2020-11-06 DIAGNOSIS — M79674 Pain in right toe(s): Secondary | ICD-10-CM

## 2020-11-06 DIAGNOSIS — M79675 Pain in left toe(s): Secondary | ICD-10-CM

## 2020-11-06 DIAGNOSIS — B3742 Candidal balanitis: Secondary | ICD-10-CM | POA: Diagnosis not present

## 2020-11-06 DIAGNOSIS — B351 Tinea unguium: Secondary | ICD-10-CM

## 2020-11-06 LAB — POCT URINALYSIS DIP (CLINITEK)
Bilirubin, UA: NEGATIVE
Blood, UA: NEGATIVE
Glucose, UA: >=1000 mg/dL — AB
Ketones, POC UA: NEGATIVE mg/dL
Leukocytes, UA: NEGATIVE
Nitrite, UA: NEGATIVE
POC PROTEIN,UA: NEGATIVE
Spec Grav, UA: 1.02 (ref 1.010–1.025)
Urobilinogen, UA: 1 E.U./dL
pH, UA: 6 (ref 5.0–8.0)

## 2020-11-06 LAB — POCT GLYCOSYLATED HEMOGLOBIN (HGB A1C): Hemoglobin A1C: 11.3 % — AB (ref 4.0–5.6)

## 2020-11-06 MED ORDER — FLUCONAZOLE 200 MG PO TABS: ORAL_TABLET | ORAL | 2 refills | Status: DC

## 2020-11-06 MED ORDER — NYSTATIN-TRIAMCINOLONE 100000-0.1 UNIT/GM-% EX OINT: 1.0000 "application " | TOPICAL_OINTMENT | Freq: Two times a day (BID) | CUTANEOUS | 1 refills | Status: DC

## 2020-11-06 NOTE — Progress Notes (Signed)
Acute Office Visit  Subjective:    Patient ID: Johnny Maddox, male    DOB: 10-08-1956, 64 y.o.   MRN: [id number]  Chief Complaint  Patient presents with   Diabetes    Patient states he has diabetes and has noticed that when he urinate he noticed some slight burning and felt like he had and yeast infection. Per patient it has went away but keeps returning.    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  Past Medical History:  Diagnosis Date   Acute laryngitis, without mention of obstruction    Allergic rhinitis, cause unspecified    Chalazion    Cough    Dermatophytosis of foot    Diabetes mellitus without complication ([facility])    Elevated PSA    Hyperlipidemia    Hypertension    Microalbuminuria    Overweight    Seborrheic dermatitis, unspecified    Sleep apnea    Unspecified disorder of autonomic nervous system    Unspecified sleep apnea    Unspecified venous (peripheral) insufficiency    Unspecified vitamin D deficiency     Past Surgical History:  Procedure Laterality Date   CIRCUMCISION     HYDROCELE EXCISION Right 09/23/2016   Procedure: HYDROCELECTOMY ADULT ;  Surgeon: Vanna Scotland, MD;  Location: [facility] ORS;  Service: Urology;  Laterality: Right;  INGUINAL APPROACH   ROTATOR CUFF REPAIR  R 2011, L 2006   L and R shoulder   TONSILLECTOMY AND ADENOIDECTOMY      Family History  Problem Relation Age of Onset   Cancer Mother        leukemia   Hypertension Mother    Alcohol abuse Father    Hypertension Brother    Prostate cancer Neg Hx    Bladder Cancer Neg Hx    Kidney disease Neg Hx    Kidney cancer Neg Hx     Social History    Socioeconomic History   Marital status: Divorced    Spouse name: n/a   Number of children: 2   Years of education: 18   Highest education level: Not on file  Occupational History   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   Drug use: No   Sexual activity: Yes    Partners: Female    Birth control/protection: Condom  Other Topics Concern   Not on file  Social History Narrative   Lives alone. Adult son is in the [employer].  His daughter lives with her mother (his ex-wife).    Separated; after 6 years of marriage. Not dating.   Always uses seat belts.    Smoke alarm in the home.    No guns in the home.    Exercise: Moderate 3 x week, 20-30 minutes,walking, member of the [facility].  Caffeine use: Moderate.   Social Determinants of Health   Financial Resource Strain: Not on file  Food Insecurity: Not on file  Transportation Needs: Not on file  Physical Activity: Not on file  Stress: Not on file  Social Connections: Not on file  Intimate Partner Violence: Not on file    Outpatient Medications Prior to Visit  Medication Sig Dispense Refill   aspirin EC 81 MG tablet Take 81 mg by mouth every evening.      atorvastatin (LIPITOR) 40 MG tablet Take 40 mg by mouth every evening.     Continuous Blood Gluc Receiver (FREESTYLE LIBRE READER) DEVI 1 Device by Other route 3 (three) times daily. E11.65 1 each 0   Continuous Blood Gluc Sensor (FREESTYLE LIBRE 14 DAY SENSOR) MISC USE TO TEST BLOOD SUGAR, CHANGING EVERY 14 DAYS  12   finasteride (PROSCAR) 5 MG tablet Take 1 tablet (5 mg total) by mouth daily. 90 tablet 3   furosemide (LASIX) 20 MG tablet Take 20  mg by mouth daily.  3   glucose blood (ONETOUCH VERIO) test strip Use 1 strip via meter three times daily as directed.     HUMALOG KWIKPEN 100 UNIT/ML KiwkPen INJECT 12 UNITS SUBCUTANEOUSLY DAILY AT 7 AM.  12   Insulin Glargine (LANTUS SOLOSTAR) 100 UNIT/ML Solostar Pen Inject 22 Units into the skin at bedtime.     Insulin Pen Needle (B-D ULTRAFINE III SHORT PEN) 31G X 8 MM MISC Use 4 (four) times daily     lisinopril (ZESTRIL) 20 MG tablet Take 20 mg by mouth daily.     metFORMIN (GLUCOPHAGE-XR) 500 MG 24 hr tablet TAKE 2 TABLETS (1,000 MG TOTAL) BY MOUTH DAILY WITH DINNER.  5   Multiple Vitamin (MULTI-VITAMINS) TABS Take by mouth.     tadalafil (CIALIS) 20 MG tablet Take 1 tablet (20 mg total) by mouth daily as needed for erectile dysfunction. 30 tablet 6   fluconazole (DIFLUCAN) 200 MG tablet Take one tablet by mouth once a week for 4 weeks 4 tablet 0   Dulaglutide 1.5 MG/0.5ML SOPN Inject 1.5 mg into the skin every Saturday.  (Patient not taking: Reported on 11/06/2020)     nystatin-triamcinolone ointment (MYCOLOG) Apply 1 application topically 2 (two) times daily. (Patient not taking: Reported on 11/06/2020) 30 g 0   No facility-administered medications prior to visit.    Allergies  Allergen Reactions   Other Anaphylaxis    Mushrooms   Seasonal Ic [Cholestatin]     Review of Systems  Constitutional: Negative.   HENT: Negative.   Eyes: Negative.   Respiratory: Negative.   Cardiovascular: Negative.   Gastrointestinal: Negative.   Genitourinary: Positive for penile discharge.  Musculoskeletal: Negative.   Skin: Positive for rash.  Neurological: Negative.   Psychiatric/Behavioral: Negative.        Objective:    Physical Exam Constitutional:      General: He is not in acute distress.    Appearance: Normal appearance. He is obese. He is not ill-appearing, toxic-appearing or diaphoretic.  Cardiovascular:     Rate and Rhythm: Normal rate and regular rhythm.   Pulmonary:     Effort: Pulmonary effort is normal. No respiratory distress.  Genitourinary:    Comments: Declined by patient Skin:    General: Skin is warm and dry.  Neurological:     Mental Status: He is alert.  Psychiatric:        Mood and Affect: Mood normal.  Behavior: Behavior normal.        Thought Content: Thought content normal.        Judgment: Judgment normal.     BP 130/78    Pulse 81    Temp 97.9 F (36.6 C) (Temporal)    Resp 18    Ht 5\' 10"  (1.778 m)    Wt 283 lb 9.6 oz (128.6 kg)    SpO2 99%    BMI 40.69 kg/m  Wt Readings from Last 3 Encounters:  11/06/20 283 lb 9.6 oz (128.6 kg)  08/15/20 283 lb 14.4 oz (128.8 kg)  10/08/19 280 lb 6.4 oz (127.2 kg)    There are no preventive care reminders to display for this patient.  There are no preventive care reminders to display for this patient.   Lab Results  Component Value Date   TSH 1.550 05/23/2017   Lab Results  Component Value Date   WBC 8.0 01/28/2018   HGB 14.5 01/28/2018   HCT 44.6 01/28/2018   MCV 84.5 01/28/2018   PLT 152 12/28/2012   Lab Results  Component Value Date   NA 140 10/08/2019   K 4.2 10/08/2019   CO2 19 (L) 10/08/2019   GLUCOSE 244 (H) 10/08/2019   BUN 18 10/08/2019   CREATININE 1.08 10/08/2019   BILITOT 0.4 10/08/2019   ALKPHOS 56 10/08/2019   AST 20 10/08/2019   ALT 25 10/08/2019   PROT 6.5 10/08/2019   ALBUMIN 4.7 10/08/2019   CALCIUM 9.7 10/08/2019   ANIONGAP 11 09/09/2016   Lab Results  Component Value Date   CHOL 192 10/08/2019   Lab Results  Component Value Date   HDL 48 10/08/2019   Lab Results  Component Value Date   LDLCALC 125 (H) 10/08/2019   Lab Results  Component Value Date   TRIG 105 10/08/2019   Lab Results  Component Value Date   CHOLHDL 4.0 10/08/2019   Lab Results  Component Value Date   HGBA1C 11.3 (A) 11/06/2020       Assessment & Plan:   Problem List Items Addressed This Visit      Endocrine   Diabetes mellitus ([place]) -  Primary (Chronic)   Relevant Orders   POCT glycosylated hemoglobin (Hb A1C) (Completed)   POCT URINALYSIS DIP (CLINITEK) (Completed)    Other Visit Diagnoses    Candidiasis of penis       Relevant Medications   nystatin-triamcinolone ointment (MYCOLOG)   fluconazole (DIFLUCAN) 200 MG tablet       Meds ordered this encounter  Medications   nystatin-triamcinolone ointment (MYCOLOG)    Sig: Apply 1 application topically 2 (two) times daily.    Dispense:  30 g    Refill:  1    Order Specific Question:   Supervising Provider    Answer:   Carlota Raspberry, JEFFREY R [[id number]]   fluconazole (DIFLUCAN) 200 MG tablet    Sig: Take one tablet by mouth once a week for 4 weeks    Dispense:  4 tablet    Refill:  2    Order Specific Question:   Supervising Provider    Answer:   Carlota Raspberry, JEFFREY R [[id number]]   PLAN  Refill meds  Pt must return to discuss better control of his t2dm and est care with provider in our office  Will check a1c today to gauge urgency of this  Result 11.3 - about steady from previous check 1 year ago. Far too high. Need to return for visit to est care  and med management.  Patient encouraged to call clinic with any questions, comments, or concerns.   Janeece Agee, NP

## 2020-11-06 NOTE — Patient Instructions (Signed)
° ° ° °  If you have lab work done today you will be contacted with your lab results within the next 2 weeks.  If you have not heard from us then please contact us. The fastest way to get your results is to register for My Chart. ° ° °IF you received an x-ray today, you will receive an invoice from [facility]. Please contact [facility] at [phone] with questions or concerns regarding your invoice.  ° °IF you received labwork today, you will receive an invoice from [facility]. Please contact [facility] at [phone] with questions or concerns regarding your invoice.  ° °Our billing staff will not be able to assist you with questions regarding bills from these companies. ° °You will be contacted with the lab results as soon as they are available. The fastest way to get your results is to activate your My Chart account. Instructions are located on the last page of this paperwork. If you have not heard from us regarding the results in 2 weeks, please contact this office. °  ° ° ° °

## 2020-11-06 NOTE — Progress Notes (Signed)
Complaint:  Visit Type: Patient returns to my office for continued preventative foot care services. Complaint: Patient states" my nails have grown long and thick and become painful to walk and wear shoes" Patient has been diagnosed with DM with no foot complications. The patient presents for preventative foot care services. No changes to ROS.  Patient is diabetic with no diabetic complications.  Podiatric Exam: Vascular: dorsalis pedis and posterior tibial pulses are palpable bilateral. Capillary return is immediate. Temperature gradient is WNL. Skin turgor WNL  Sensorium: Normal Semmes Weinstein monofilament test. Normal tactile sensation bilaterally. Nail Exam: Pt has thick disfigured discolored nails with subungual debris noted bilateral entire nail hallux through fifth toenails.  His second toenail right foot is thickand disfigured. Ulcer Exam: There is no evidence of ulcer or pre-ulcerative changes or infection. Orthopedic Exam: Muscle tone and strength are WNL. No limitations in general ROM. No crepitus or effusions noted. Foot type and digits show no abnormalities. Bony prominences are unremarkable. Skin: No Porokeratosis. No infection or ulcers  Diagnosis:  Onychomycosis, , Pain in right toe, pain in left toes  Treatment & Plan Procedures and Treatment: Consent by patient was obtained for treatment procedures.   Debridement of mycotic and hypertrophic toenails, 1 through 5 bilateral and clearing of subungual debris. No ulceration, no infection noted. To schedule nail surgery for removal second toenail right foot. Return Visit-Office Procedure: Patient instructed to return to the office for a follow up visit 4 months for continued evaluation and treatment.      Gardiner Barefoot DPM

## 2020-11-17 ENCOUNTER — Other Ambulatory Visit: Payer: Self-pay

## 2020-11-22 ENCOUNTER — Other Ambulatory Visit: Payer: Managed Care, Other (non HMO)

## 2020-11-22 ENCOUNTER — Other Ambulatory Visit: Payer: Self-pay

## 2020-11-22 DIAGNOSIS — R972 Elevated prostate specific antigen [PSA]: Secondary | ICD-10-CM

## 2020-11-23 LAB — PSA: Prostate Specific Ag, Serum: 5.4 ng/mL — ABNORMAL HIGH (ref 0.0–4.0)

## 2020-11-24 ENCOUNTER — Encounter (INDEPENDENT_AMBULATORY_CARE_PROVIDER_SITE_OTHER): Payer: Managed Care, Other (non HMO) | Admitting: Ophthalmology

## 2020-11-28 ENCOUNTER — Telehealth: Payer: Self-pay | Admitting: Family Medicine

## 2020-11-28 NOTE — Telephone Encounter (Signed)
Patient notified and did not want to schedule an appointment at this time. Patient states he will call back to get the appointment scheduled.

## 2020-11-28 NOTE — Telephone Encounter (Signed)
-----   Message from Nori Riis, PA-C sent at 11/27/2020  9:51 AM EST ----- Please ask Johnny Maddox if he is taking his finasteride consistently.  If he has, his PSA has increased and he needs an appointment to discuss further steps.

## 2020-12-27 ENCOUNTER — Encounter (INDEPENDENT_AMBULATORY_CARE_PROVIDER_SITE_OTHER): Payer: Managed Care, Other (non HMO) | Admitting: Ophthalmology

## 2020-12-27 ENCOUNTER — Other Ambulatory Visit: Payer: Self-pay

## 2020-12-27 DIAGNOSIS — H33303 Unspecified retinal break, bilateral: Secondary | ICD-10-CM

## 2020-12-27 DIAGNOSIS — H35033 Hypertensive retinopathy, bilateral: Secondary | ICD-10-CM

## 2020-12-27 DIAGNOSIS — H35413 Lattice degeneration of retina, bilateral: Secondary | ICD-10-CM

## 2020-12-27 DIAGNOSIS — I10 Essential (primary) hypertension: Secondary | ICD-10-CM | POA: Diagnosis not present

## 2020-12-27 DIAGNOSIS — H43813 Vitreous degeneration, bilateral: Secondary | ICD-10-CM

## 2021-01-05 ENCOUNTER — Other Ambulatory Visit: Payer: Self-pay

## 2021-01-05 ENCOUNTER — Encounter (INDEPENDENT_AMBULATORY_CARE_PROVIDER_SITE_OTHER): Payer: Managed Care, Other (non HMO) | Admitting: Ophthalmology

## 2021-01-05 DIAGNOSIS — H33301 Unspecified retinal break, right eye: Secondary | ICD-10-CM | POA: Diagnosis not present

## 2021-01-10 ENCOUNTER — Encounter: Payer: Managed Care, Other (non HMO) | Admitting: Registered Nurse

## 2021-01-22 ENCOUNTER — Encounter (INDEPENDENT_AMBULATORY_CARE_PROVIDER_SITE_OTHER): Payer: Managed Care, Other (non HMO) | Admitting: Ophthalmology

## 2021-01-26 ENCOUNTER — Other Ambulatory Visit: Payer: Self-pay

## 2021-01-26 ENCOUNTER — Encounter (INDEPENDENT_AMBULATORY_CARE_PROVIDER_SITE_OTHER): Payer: Managed Care, Other (non HMO) | Admitting: Ophthalmology

## 2021-01-26 DIAGNOSIS — H33303 Unspecified retinal break, bilateral: Secondary | ICD-10-CM

## 2021-02-15 NOTE — Progress Notes (Incomplete)
02/16/2021 8:34 PM   Johnny Maddox April 15, 1956 [id number]  Referring Lucian Baswell: [name], MD [phone] [place],  [place] [address]  No chief complaint on file.  Urological history: 1. Elevated PSA -PSA Trend Component     Latest Ref Rng & Units 04/03/2016 04/10/2016 10/11/2016 02/04/2017  Prostate Specific Ag, Serum     0.0 - 4.0 ng/mL 4.4 (H) 4.0 6.2 (H) 3.7   Component     Latest Ref Rng & Units 05/23/2017 12/22/2017 05/29/2018 09/09/2019  Prostate Specific Ag, Serum     0.0 - 4.0 ng/mL 3.4 3.3 4.7 (H) 6.5 (H)   Component     Latest Ref Rng & Units 09/14/2019 10/29/2019 08/15/2020 11/22/2020  Prostate Specific Ag, Serum     0.0 - 4.0 ng/mL 5.3 (H) 3.9 4.2 (H) 5.4 (H)   Patient on finasteride, but he does not take it consistently.    2. ED -SHIM *** -contributing factors of age, BPH, DM and HTN, HLD and sleep apnea -managed with tadalafil 20 mg, on-demand-dosing  3. BPH with LU TS -PSA pending -I PSS *** -PVR *** -managed with finasteride 5 mg daily  4. Left testicular mass -scrotal ultrasound 08/2020 Stable 4 mm nodule in the left testicle. This is favored to be benign given its 3 year stability  HPI: Johnny Maddox is a 65 y.o. male who presents today for follow up.       Score: 1-7 Severe ED 8-11 Moderate ED 12-16 Mild-Moderate ED 17-21 Mild ED 22-25 No ED       Score:  1-7 Mild 8-19 Moderate 20-35 Severe   PMH: Past Medical History:  Diagnosis Date  . Acute laryngitis, without mention of obstruction   . Allergic rhinitis, cause unspecified   . Chalazion   . Cough   . Dermatophytosis of foot   . Diabetes mellitus without complication ([place])   . Elevated PSA   . Hyperlipidemia   . Hypertension   . Microalbuminuria   . Overweight   . Seborrheic dermatitis, unspecified   . Sleep apnea   . Unspecified disorder of autonomic nervous system   . Unspecified sleep apnea   . Unspecified venous (peripheral)  insufficiency   . Unspecified vitamin D deficiency     Surgical History: Past Surgical History:  Procedure Laterality Date  . CIRCUMCISION    . HYDROCELE EXCISION Right 09/23/2016   Procedure: HYDROCELECTOMY ADULT ;  Surgeon: Hollice Espy, MD;  Location: [facility] ORS;  Service: Urology;  Laterality: Right;  INGUINAL APPROACH  . ROTATOR CUFF REPAIR  R 2011, L 2006   L and R shoulder  . TONSILLECTOMY AND ADENOIDECTOMY      Home Medications:  Allergies as of 02/16/2021      Reactions   Other Anaphylaxis   Mushrooms   Seasonal Ic [cholestatin]       Medication List       Accurate as of February 15, 2021  8:34 PM. If you have any questions, ask your nurse or doctor.        aspirin EC 81 MG tablet Take 81 mg by mouth every evening.   atorvastatin 40 MG tablet Commonly known as: LIPITOR Take 40 mg by mouth every evening.   B-D ULTRAFINE III SHORT PEN 31G X 8 MM Misc Generic drug: Insulin Pen Needle Use 4 (four) times daily   Dulaglutide 1.5 MG/0.5ML Sopn Inject 1.5 mg into the skin every Saturday.   finasteride 5  MG tablet Commonly known as: PROSCAR Take 1 tablet (5 mg total) by mouth daily.   fluconazole 200 MG tablet Commonly known as: DIFLUCAN Take one tablet by mouth once a week for 4 weeks   FreeStyle Libre 14 Day Sensor Misc USE TO TEST BLOOD SUGAR, CHANGING EVERY 14 DAYS   FreeStyle Libre Reader Devi 1 Device by Other route 3 (three) times daily. E11.65   furosemide 20 MG tablet Commonly known as: LASIX Take 20 mg by mouth daily.   HumaLOG KwikPen 100 UNIT/ML KwikPen Generic drug: insulin lispro INJECT 12 UNITS SUBCUTANEOUSLY DAILY AT 7 AM.   Lantus SoloStar 100 UNIT/ML Solostar Pen Generic drug: insulin glargine Inject 22 Units into the skin at bedtime.   lisinopril 20 MG tablet Commonly known as: ZESTRIL Take 20 mg by mouth daily.   metFORMIN 500 MG 24 hr tablet Commonly known as: GLUCOPHAGE-XR TAKE 2 TABLETS (1,000 MG TOTAL) BY MOUTH DAILY WITH  DINNER.   Multi-Vitamins Tabs Take by mouth.   nystatin-triamcinolone ointment Commonly known as: MYCOLOG Apply 1 application topically 2 (two) times daily.   OneTouch Verio test strip Generic drug: glucose blood Use 1 strip via meter three times daily as directed.   tadalafil 20 MG tablet Commonly known as: CIALIS Take 1 tablet (20 mg total) by mouth daily as needed for erectile dysfunction.       Allergies:  Allergies  Allergen Reactions  . Other Anaphylaxis    Mushrooms  . Seasonal Ic [Cholestatin]     Family History: Family History  Problem Relation Age of Onset  . Cancer Mother        leukemia  . Hypertension Mother   . Alcohol abuse Father   . Hypertension Brother   . Prostate cancer Neg Hx   . Bladder Cancer Neg Hx   . Kidney disease Neg Hx   . Kidney cancer Neg Hx     Social History:  reports that he has quit smoking. He has never used smokeless tobacco. He reports current alcohol use of about 2.0 standard drinks of alcohol per week. He reports that he does not use drugs.  ROS: For pertinent review of systems please refer to history of present illness  Physical Exam: There were no vitals taken for this visit.  Constitutional:  Well nourished. Alert and oriented, No acute distress. HEENT: [place] AT, moist mucus membranes.  Trachea midline Cardiovascular: No clubbing, cyanosis, or edema. Respiratory: Normal respiratory effort, no increased work of breathing. GI: Abdomen is soft, non tender, non distended, no abdominal masses. Liver and spleen not palpable.  No hernias appreciated.  Stool sample for occult testing is not indicated.   GU: No CVA tenderness.  No bladder fullness or masses.  Patient with circumcised/uncircumcised phallus. ***Foreskin easily retracted***  Urethral meatus is patent.  No penile discharge. No penile lesions or rashes. Scrotum without lesions, cysts, rashes and/or edema.  Testicles are located scrotally bilaterally. No masses are  appreciated in the testicles. Left and right epididymis are normal. Rectal: Patient with  normal sphincter tone. Anus and perineum without scarring or rashes. No rectal masses are appreciated. Prostate is approximately *** grams, *** nodules are appreciated. Seminal vesicles are normal. Skin: No rashes, bruises or suspicious lesions. Lymph: No inguinal adenopathy. Neurologic: Grossly intact, no focal deficits, moving all 4 extremities. Psychiatric: Normal mood and affect.  Laboratory Data: See HPI I have reviewed the labs.  Pertinent Imaging: ***   Assessment & Plan:    1. Testicular mass -continue  self testicular exams  2. Elevated PSA History of fluctuating PSA PSA pending  3. BPH with LUTS IPSS score is 8/6, it is worsening Continue conservative management, avoiding bladder irritants and timed voiding's Most bothersome symptoms was frequency that lasted about two weeks and then abated  Continue finasteride 5 mg daily - refills given RTC in 6 months for IPSS, PSA and exam - if PSA is stable  4. Erectile dysfunction SHIM score is 10, it is improved tadalafil 20 mg - [facility] in 6 months for repeat SHIM score and exam    No follow-ups on file.  Zara Council, PA-C  [facility] [address] [place], [place] [address] [phone]

## 2021-02-16 ENCOUNTER — Ambulatory Visit: Payer: Self-pay | Admitting: Urology

## 2021-02-16 DIAGNOSIS — N401 Enlarged prostate with lower urinary tract symptoms: Secondary | ICD-10-CM

## 2021-02-16 DIAGNOSIS — R972 Elevated prostate specific antigen [PSA]: Secondary | ICD-10-CM

## 2021-02-16 DIAGNOSIS — N529 Male erectile dysfunction, unspecified: Secondary | ICD-10-CM

## 2021-02-16 DIAGNOSIS — D4959 Neoplasm of unspecified behavior of other genitourinary organ: Secondary | ICD-10-CM

## 2021-03-01 NOTE — Progress Notes (Signed)
Error

## 2021-03-02 ENCOUNTER — Ambulatory Visit: Payer: Managed Care, Other (non HMO) | Admitting: Urology

## 2021-03-02 ENCOUNTER — Other Ambulatory Visit: Payer: Self-pay

## 2021-03-02 VITALS — BP 150/92 | HR 91 | Ht 70.0 in | Wt 285.0 lb

## 2021-03-02 DIAGNOSIS — D4959 Neoplasm of unspecified behavior of other genitourinary organ: Secondary | ICD-10-CM | POA: Diagnosis not present

## 2021-03-02 DIAGNOSIS — N529 Male erectile dysfunction, unspecified: Secondary | ICD-10-CM | POA: Diagnosis not present

## 2021-03-02 DIAGNOSIS — R972 Elevated prostate specific antigen [PSA]: Secondary | ICD-10-CM | POA: Diagnosis not present

## 2021-03-02 DIAGNOSIS — N401 Enlarged prostate with lower urinary tract symptoms: Secondary | ICD-10-CM | POA: Diagnosis not present

## 2021-03-02 DIAGNOSIS — N138 Other obstructive and reflux uropathy: Secondary | ICD-10-CM

## 2021-03-02 MED ORDER — NYSTATIN 100000 UNIT/GM EX CREA: 1.0000 "application " | TOPICAL_CREAM | Freq: Two times a day (BID) | CUTANEOUS | 3 refills | Status: DC

## 2021-03-02 MED ORDER — TADALAFIL 20 MG PO TABS: 20.0000 mg | ORAL_TABLET | Freq: Every day | ORAL | 1 refills | Status: DC | PRN

## 2021-03-02 MED ORDER — FLUCONAZOLE 100 MG PO TABS: 100.0000 mg | ORAL_TABLET | Freq: Every day | ORAL | 3 refills | Status: DC

## 2021-03-02 NOTE — Progress Notes (Signed)
03/02/2021 11:04 AM   Johnny Maddox Oct 26, 1956 [id number]  Referring provider: [name], MD [phone] [place],  [place] [address]  Chief Complaint  Patient presents with  . Benign Prostatic Hypertrophy   Urological history: 1. Elevated PSA -PSA Trend Component     Latest Ref Rng & Units 04/03/2016 04/10/2016 10/11/2016 02/04/2017  Prostate Specific Ag, Serum     0.0 - 4.0 ng/mL 4.4 (H) 4.0 6.2 (H) 3.7   Component     Latest Ref Rng & Units 05/23/2017 12/22/2017 05/29/2018 09/09/2019  Prostate Specific Ag, Serum     0.0 - 4.0 ng/mL 3.4 3.3 4.7 (H) 6.5 (H)   Component     Latest Ref Rng & Units 09/14/2019 10/29/2019 08/15/2020 11/22/2020  Prostate Specific Ag, Serum     0.0 - 4.0 ng/mL 5.3 (H) 3.9 4.2 (H) 5.4 (H)   Patient on finasteride, but he does not take it consistently.    2. ED -SHIM 13 -contributing factors of age, BPH, DM and HTN, HLD and sleep apnea -managed with tadalafil 20 mg, on-demand-dosing  3. BPH with LUTS -PSA pending -I PSS 10/3 -managed with finasteride 5 mg daily  4. Left testicular mass -scrotal ultrasound 08/2020 Stable 4 mm nodule in the left testicle. This is favored to be benign given its 3 year stability  HPI: Johnny Maddox is a 65 y.o. male who presents today for follow up.   Today the patient reports that he is doing well.   He says that he tries to take his Finasteride everyday but is not 100% consistent with it.  Reports that he is having trouble achieving a firm erection, even on medication. He also reported that erections have been painful.  Patient reports that he has not been as sexually active since COVID.  Patient denies painful erections, or any abnormal curvature. No dysuria, burning, or gross hematuria reported.     SHIM    Row Name 03/02/21 1043         SHIM: Over the last 6 months:   How do you rate your confidence that you could get and keep an erection? Low     When you had erections  with sexual stimulation, how often were your erections hard enough for penetration (entering your partner)? Sometimes (about half the time)     During sexual intercourse, how often were you able to maintain your erection after you had penetrated (entered) your partner? Sometimes (about half the time)     During sexual intercourse, how difficult was it to maintain your erection to completion of intercourse? Difficult     When you attempted sexual intercourse, how often was it satisfactory for you? A Few Times (much less than half the time)           SHIM Total Score   SHIM 13            Score: 1-7 Severe ED 8-11 Moderate ED 12-16 Mild-Moderate ED 17-21 Mild ED 22-25 No ED      IPSS    Row Name 03/02/21 1000         International Prostate Symptom Score   How often have you had the sensation of not emptying your bladder? Not at All     How often have you had to urinate less than every two hours? About half the time     How often have you found you stopped and started again several times  when you urinated? About half the time     How often have you found it difficult to postpone urination? Less than half the time     How often have you had a weak urinary stream? Not at All     How often have you had to strain to start urination? Not at All     How many times did you typically get up at night to urinate? 2 Times     Total IPSS Score 10           Quality of Life due to urinary symptoms   If you were to spend the rest of your life with your urinary condition just the way it is now how would you feel about that? Mixed            Score:  1-7 Mild 8-19 Moderate 20-35 Severe   PMH: Past Medical History:  Diagnosis Date  . Acute laryngitis, without mention of obstruction   . Allergic rhinitis, cause unspecified   . Chalazion   . Cough   . Dermatophytosis of foot   . Diabetes mellitus without complication ([place])   . Elevated PSA   . Hyperlipidemia   . Hypertension   .  Microalbuminuria   . Overweight   . Seborrheic dermatitis, unspecified   . Sleep apnea   . Unspecified disorder of autonomic nervous system   . Unspecified sleep apnea   . Unspecified venous (peripheral) insufficiency   . Unspecified vitamin D deficiency     Surgical History: Past Surgical History:  Procedure Laterality Date  . CIRCUMCISION    . HYDROCELE EXCISION Right 09/23/2016   Procedure: HYDROCELECTOMY ADULT ;  Surgeon: Hollice Espy, MD;  Location: [facility] ORS;  Service: Urology;  Laterality: Right;  INGUINAL APPROACH  . ROTATOR CUFF REPAIR  R 2011, L 2006   L and R shoulder  . TONSILLECTOMY AND ADENOIDECTOMY      Home Medications:  Allergies as of 03/02/2021      Reactions   Other Anaphylaxis   Mushrooms   Seasonal Ic [cholestatin]       Medication List       Accurate as of March 02, 2021 11:04 AM. If you have any questions, ask your nurse or doctor.        STOP taking these medications   fluconazole 200 MG tablet Commonly known as: DIFLUCAN Stopped by: Zara Council, PA-C   nystatin-triamcinolone ointment Commonly known as: MYCOLOG Stopped by: Zara Council, PA-C     TAKE these medications   aspirin EC 81 MG tablet Take 81 mg by mouth every evening.   atorvastatin 40 MG tablet Commonly known as: LIPITOR Take 40 mg by mouth every evening.   B-D ULTRAFINE III SHORT PEN 31G X 8 MM Misc Generic drug: Insulin Pen Needle Use 4 (four) times daily   Dulaglutide 1.5 MG/0.5ML Sopn Inject 1.5 mg into the skin every Saturday.   finasteride 5 MG tablet Commonly known as: PROSCAR Take 1 tablet (5 mg total) by mouth daily.   FreeStyle Libre 14 Day Sensor Misc USE TO TEST BLOOD SUGAR, CHANGING EVERY 14 DAYS   FreeStyle Libre Reader Devi 1 Device by Other route 3 (three) times daily. E11.65   furosemide 20 MG tablet Commonly known as: LASIX Take 20 mg by mouth daily.   HumaLOG KwikPen 100 UNIT/ML KwikPen Generic drug: insulin lispro INJECT 12  UNITS SUBCUTANEOUSLY DAILY AT 7 AM.   Lantus SoloStar 100 UNIT/ML Solostar Pen Generic drug:  insulin glargine Inject 22 Units into the skin at bedtime.   lisinopril 40 MG tablet Commonly known as: ZESTRIL Take by mouth. What changed: Another medication with the same name was removed. Continue taking this medication, and follow the directions you see here. Changed by: Zara Council, PA-C   metFORMIN 500 MG 24 hr tablet Commonly known as: GLUCOPHAGE-XR TAKE 2 TABLETS (1,000 MG TOTAL) BY MOUTH DAILY WITH DINNER.   Multi-Vitamins Tabs Take by mouth.   OneTouch Verio test strip Generic drug: glucose blood Use 1 strip via meter three times daily as directed.   tadalafil 20 MG tablet Commonly known as: CIALIS Take 1 tablet (20 mg total) by mouth daily as needed for erectile dysfunction.       Allergies:  Allergies  Allergen Reactions  . Other Anaphylaxis    Mushrooms  . Seasonal Ic [Cholestatin]     Family History: Family History  Problem Relation Age of Onset  . Cancer Mother        leukemia  . Hypertension Mother   . Alcohol abuse Father   . Hypertension Brother   . Prostate cancer Neg Hx   . Bladder Cancer Neg Hx   . Kidney disease Neg Hx   . Kidney cancer Neg Hx     Social History:  reports that he has quit smoking. He has never used smokeless tobacco. He reports current alcohol use of about 2.0 standard drinks of alcohol per week. He reports that he does not use drugs.  ROS: For pertinent review of systems please refer to history of present illness  Physical Exam: BP (!) 150/92   Pulse 91   Ht 5\' 10"  (1.778 m)   Wt 285 lb (129.3 kg)   BMI 40.89 kg/m   Constitutional:  Well nourished. Alert and oriented, No acute distress. HEENT: [place] AT, moist mucus membranes.  Trachea midline Cardiovascular: No clubbing, cyanosis, or edema. Respiratory: Normal respiratory effort, no increased work of breathing. GU: No CVA tenderness.  No bladder fullness or masses.   Patient with uncircumcised phallus. Foreskin easily retracted. Urethral meatus is patent.  No penile discharge. No penile lesions or rashes. Scrotum without lesions, cysts, rashes and/or edema.  Testicles are located scrotally bilaterally. No masses are appreciated in the testicles. Left and right epididymis are normal. Balanitis. Rectal: Patient with  normal sphincter tone. Anus and perineum without scarring or rashes. No rectal masses are appreciated. Prostate is approximately 55 grams, no nodules. Seminal vesicles could not be appreciated. Could only appreciate apex. Skin: No rashes, bruises or suspicious lesions. Lymph: No inguinal adenopathy. Neurologic: Grossly intact, no focal deficits, moving all 4 extremities. Psychiatric: Normal mood and affect.  Laboratory Data: See HPI  I have reviewed the labs.  Pertinent Imaging:  No results found for any visits on 03/02/21.    Assessment & Plan:    1. Testicular mass -continue self testicular exams  2. Elevated PSA History of fluctuating PSA PSA pending  3. BPH with LUTS IPSS score is 10/3, it is worsening Continue conservative management, avoiding bladder irritants and timed voiding's Most bothersome symptoms was frequency that lasted about two weeks and then abated  Continue finasteride 5 mg daily - refills given RTC in 6 months for IPSS, PSA and exam - if PSA is stable  4. Erectile dysfunction SHIM score is 13, it is worse Tadalafil from 20 mg daily- [facility] in 6 months for repeat SHIM score and exam  Cialis 20 mg, 90  day supply  5. Balanitis - Nystatin cream applied BID prn - Fluconazole 100 mg tablet once a day for 7 days, sent to [facility]    No follow-ups on file.   I, Ardyth Gal, am acting as a Education administrator for [employer].    [facility] [address], [place], [place] [address] [phone]  I have reviewed the  above documentation for accuracy and completeness, and I agree with the above.    Zara Council, PA-C

## 2021-03-03 LAB — PSA: Prostate Specific Ag, Serum: 4.7 ng/mL — ABNORMAL HIGH (ref 0.0–4.0)

## 2021-03-05 ENCOUNTER — Encounter: Payer: Self-pay | Admitting: Urology

## 2021-03-08 ENCOUNTER — Ambulatory Visit: Payer: Managed Care, Other (non HMO) | Admitting: Podiatry

## 2021-05-23 ENCOUNTER — Other Ambulatory Visit: Payer: Self-pay | Admitting: *Deleted

## 2021-05-23 DIAGNOSIS — N401 Enlarged prostate with lower urinary tract symptoms: Secondary | ICD-10-CM

## 2021-05-23 DIAGNOSIS — N138 Other obstructive and reflux uropathy: Secondary | ICD-10-CM

## 2021-05-23 MED ORDER — FINASTERIDE 5 MG PO TABS: 5.0000 mg | ORAL_TABLET | Freq: Every day | ORAL | 3 refills | Status: DC

## 2021-05-24 ENCOUNTER — Other Ambulatory Visit: Payer: Self-pay

## 2021-05-24 ENCOUNTER — Emergency Department: Payer: Managed Care, Other (non HMO)

## 2021-05-24 ENCOUNTER — Encounter: Payer: Self-pay | Admitting: Physician Assistant

## 2021-05-24 ENCOUNTER — Emergency Department
Admission: EM | Admit: 2021-05-24 | Discharge: 2021-05-24 | Disposition: A | Discharge status: Home / Self Care | Payer: Managed Care, Other (non HMO) | Attending: Emergency Medicine | Admitting: Emergency Medicine

## 2021-05-24 DIAGNOSIS — F40248 Other situational type phobia: Secondary | ICD-10-CM | POA: Diagnosis not present

## 2021-05-24 DIAGNOSIS — Z7982 Long term (current) use of aspirin: Secondary | ICD-10-CM | POA: Diagnosis not present

## 2021-05-24 DIAGNOSIS — Z87891 Personal history of nicotine dependence: Secondary | ICD-10-CM | POA: Insufficient documentation

## 2021-05-24 DIAGNOSIS — E119 Type 2 diabetes mellitus without complications: Secondary | ICD-10-CM | POA: Insufficient documentation

## 2021-05-24 DIAGNOSIS — R55 Syncope and collapse: Secondary | ICD-10-CM | POA: Insufficient documentation

## 2021-05-24 DIAGNOSIS — F4323 Adjustment disorder with mixed anxiety and depressed mood: Secondary | ICD-10-CM | POA: Diagnosis not present

## 2021-05-24 DIAGNOSIS — Z79899 Other long term (current) drug therapy: Secondary | ICD-10-CM | POA: Diagnosis not present

## 2021-05-24 DIAGNOSIS — I10 Essential (primary) hypertension: Secondary | ICD-10-CM | POA: Insufficient documentation

## 2021-05-24 DIAGNOSIS — F418 Other specified anxiety disorders: Secondary | ICD-10-CM

## 2021-05-24 DIAGNOSIS — F430 Acute stress reaction: Secondary | ICD-10-CM | POA: Insufficient documentation

## 2021-05-24 DIAGNOSIS — F4321 Adjustment disorder with depressed mood: Secondary | ICD-10-CM

## 2021-05-24 DIAGNOSIS — Z7984 Long term (current) use of oral hypoglycemic drugs: Secondary | ICD-10-CM | POA: Diagnosis not present

## 2021-05-24 DIAGNOSIS — Z794 Long term (current) use of insulin: Secondary | ICD-10-CM | POA: Diagnosis not present

## 2021-05-24 DIAGNOSIS — F4381 Prolonged grief disorder: Secondary | ICD-10-CM

## 2021-05-24 LAB — COMPREHENSIVE METABOLIC PANEL
ALT: 22 U/L (ref 0–44)
AST: 25 U/L (ref 15–41)
Albumin: 4.1 g/dL (ref 3.5–5.0)
Alkaline Phosphatase: 39 U/L (ref 38–126)
Anion gap: 8 (ref 5–15)
BUN: 12 mg/dL (ref 8–23)
CO2: 25 mmol/L (ref 22–32)
Calcium: 9.2 mg/dL (ref 8.9–10.3)
Chloride: 105 mmol/L (ref 98–111)
Creatinine, Ser: 0.88 mg/dL (ref 0.61–1.24)
GFR, Estimated: >60 mL/min (ref >60)
Glucose, Bld: 167 mg/dL — ABNORMAL HIGH (ref 70–99)
Potassium: 3.6 mmol/L (ref 3.5–5.1)
Sodium: 138 mmol/L (ref 135–145)
Total Bilirubin: 0.7 mg/dL (ref 0.3–1.2)
Total Protein: 6.6 g/dL (ref 6.5–8.1)

## 2021-05-24 LAB — CBC WITH DIFFERENTIAL/PLATELET
Abs Immature Granulocytes: 0.01 10*3/uL (ref 0.00–0.07)
Basophils Absolute: 0 10*3/uL (ref 0.0–0.1)
Basophils Relative: 0 %
Eosinophils Absolute: 0.1 10*3/uL (ref 0.0–0.5)
Eosinophils Relative: 2 %
HCT: 41.3 % (ref 39.0–52.0)
Hemoglobin: 13.5 g/dL (ref 13.0–17.0)
Immature Granulocytes: 0 %
Lymphocytes Relative: 26 %
Lymphs Abs: 1.2 10*3/uL (ref 0.7–4.0)
MCH: 27.3 pg (ref 26.0–34.0)
MCHC: 32.7 g/dL (ref 30.0–36.0)
MCV: 83.4 fL (ref 80.0–100.0)
Monocytes Absolute: 0.4 10*3/uL (ref 0.1–1.0)
Monocytes Relative: 8 %
Neutro Abs: 3 10*3/uL (ref 1.7–7.7)
Neutrophils Relative %: 64 %
Platelets: 123 10*3/uL — ABNORMAL LOW (ref 150–400)
RBC: 4.95 MIL/uL (ref 4.22–5.81)
RDW: 14.1 % (ref 11.5–15.5)
WBC: 4.7 10*3/uL (ref 4.0–10.5)
nRBC: 0 % (ref 0.0–0.2)

## 2021-05-24 LAB — CBG MONITORING, ED: Glucose-Capillary: 220 mg/dL — ABNORMAL HIGH (ref 70–99)

## 2021-05-24 LAB — TROPONIN I (HIGH SENSITIVITY)
Troponin I (High Sensitivity): 22 ng/L — ABNORMAL HIGH (ref <18)
Troponin I (High Sensitivity): 20 ng/L — ABNORMAL HIGH (ref <18)

## 2021-05-24 MED ORDER — SODIUM CHLORIDE 0.9 % IV BOLUS
500.0000 mL | Freq: Once | INTRAVENOUS | Status: AC
  Administered 2021-05-24: 500 mL via INTRAVENOUS

## 2021-05-24 MED ORDER — MIRTAZAPINE 15 MG PO TABS: 15.0000 mg | ORAL_TABLET | Freq: Every day | ORAL | Status: DC

## 2021-05-24 MED ORDER — LORAZEPAM 2 MG/ML IJ SOLN
1.0000 mg | Freq: Once | INTRAMUSCULAR | Status: AC
  Administered 2021-05-24: 1 mg via INTRAVENOUS
  Filled 2021-05-24: qty 1

## 2021-05-24 MED ORDER — MIRTAZAPINE 15 MG PO TABS: 15.0000 mg | ORAL_TABLET | Freq: Every day | ORAL | 1 refills | Status: DC

## 2021-05-24 NOTE — Consult Note (Signed)
Brief note, full note to follow.  Case reviewed with treatment team.  Patient does not need inpatient hospitalization.  Suffering from acute stress anxiety and symptoms of depression.  See full note for treatment plan but the patient does not need to be kept in the hospital.

## 2021-05-24 NOTE — ED Provider Notes (Signed)
[facility] Emergency Department Provider Note  ____________________________________________   Event Date/Time   First MD Initiated Contact with Patient 05/24/21 1118     (approximate)  I have reviewed the triage vital signs and the nursing notes.   HISTORY  Chief Complaint Loss of Consciousness (unwitnessed)    HPI Johnny Maddox is a 65 y.o. male presents to the emergency department after being found unconscious at work.  Patient states that he remembers going to work and having a couple coffee but that is it.  Patient's not been sleeping well has he is taking care of his son is 24-year-old and 83-year-old.  His son was recently murdered and then the mother of the children committed suicide 2 weeks later.  He states he has not been able to take time off of work as his employer is telling him he already took time.  He has not been there long enough to have FMLA privileges.  He states he is very depressed and upset but not suicidal.  States he has to be strong for the children.  He denies chest pain or shortness of breath.  No headache.  Past Medical History:  Diagnosis Date   Acute laryngitis, without mention of obstruction    Allergic rhinitis, cause unspecified    [place]azion    Cough    Dermatophytosis of foot    Diabetes mellitus without complication (HCC)    Elevated PSA    Hyperlipidemia    Hypertension    Microalbuminuria    Overweight    Seborrheic dermatitis, unspecified    Sleep apnea    Unspecified disorder of autonomic nervous system    Unspecified sleep apnea    Unspecified venous (peripheral) insufficiency    Unspecified vitamin D deficiency     Patient Active Problem List   Diagnosis Date Noted   Sarcoidosis 11/12/2018   Diverticulosis of sigmoid colon 12/15/2017   Hypertension associated with diabetes ([place]) 09/08/2017   Morbid obesity with BMI of 40.0-44.9, adult ([place]) 08/06/2016   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    Follow-up circumcision 12/11/2015   Balanitis 10/11/2015   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   Avitaminosis D 09/13/2015   History of elevated PSA 06/06/2015   Hydrocele sac 05/08/2015   Impotence 05/08/2015   BPH with obstruction/lower urinary tract symptoms 05/08/2015   ED (erectile dysfunction) of organic origin 07/12/2014   Microalbuminuria 07/10/2014   Dyslipidemia 06/02/2014   Diabetes mellitus, type 2 ([place]) 06/02/2014   Type 2 diabetes mellitus ([place]) 06/02/2014   Cough syncope 06/08/2013   Diabetes mellitus ([place]) 10/24/2012   Chronic venous insufficiency 03/13/2008   Apnea, sleep 03/13/2008    Past Surgical History:  Procedure Laterality Date   CIRCUMCISION     HYDROCELE EXCISION Right 09/23/2016   Procedure: HYDROCELECTOMY ADULT ;  Surgeon: Hollice Espy, MD;  Location: [facility] ORS;  Service: Urology;  Laterality: Right;  INGUINAL APPROACH   ROTATOR CUFF REPAIR  R 2011, L 2006   L and R shoulder   TONSILLECTOMY AND ADENOIDECTOMY      Prior to Admission medications   Medication Sig Start Date End Date Taking? Authorizing Provider  aspirin EC 81 MG tablet Take 81 mg by mouth every evening.     [provider]  atorvastatin (LIPITOR) 40 MG tablet Take 40 mg by mouth every evening. 06/05/16   [provider]  Continuous Blood Gluc Receiver (FREESTYLE LIBRE READER) DEVI 1 Device by Other route 3 (three) times daily. E11.65  10/08/19   [name], MD  Continuous Blood Gluc Sensor (FREESTYLE LIBRE 14 DAY SENSOR) MISC USE TO TEST BLOOD SUGAR, CHANGING EVERY 14 DAYS 06/01/18   [provider]  Dulaglutide 1.5 MG/0.5ML SOPN Inject 1.5 mg into the skin every Saturday.    [provider]  finasteride (PROSCAR) 5 MG tablet Take 1 tablet (5 mg total) by mouth daily. 05/23/21   Zara Council A, PA-C  fluconazole (DIFLUCAN) 100 MG tablet Take 1 tablet (100 mg total) by mouth daily. X 7 days 03/02/21   Zara Council A, PA-C  furosemide  (LASIX) 20 MG tablet Take 20 mg by mouth daily. 08/28/18   [provider]  glucose blood (ONETOUCH VERIO) test strip Use 1 strip via meter three times daily as directed. 09/28/15   [provider]  HUMALOG KWIKPEN 100 UNIT/ML KiwkPen INJECT 12 UNITS SUBCUTANEOUSLY DAILY AT 7 AM. 06/05/16   [provider]  Insulin Glargine (LANTUS SOLOSTAR) 100 UNIT/ML Solostar Pen Inject 22 Units into the skin at bedtime. 09/29/18   [provider]  Insulin Pen Needle (B-D ULTRAFINE III SHORT PEN) 31G X 8 MM MISC Use 4 (four) times daily 01/13/19   [provider]  lisinopril (ZESTRIL) 40 MG tablet Take by mouth. 11/16/20   [provider]  metFORMIN (GLUCOPHAGE-XR) 500 MG 24 hr tablet TAKE 2 TABLETS (1,000 MG TOTAL) BY MOUTH DAILY WITH DINNER. 07/03/18   [provider]  mirtazapine (REMERON) 15 MG tablet Take 1 tablet (15 mg total) by mouth at bedtime. 05/24/21   Clapacs, Madie Reno, MD  Multiple Vitamin (MULTI-VITAMINS) TABS Take by mouth.    [provider]  nystatin cream (MYCOSTATIN) Apply 1 application topically 2 (two) times daily. 03/02/21   Zara Council A, PA-C  tadalafil (CIALIS) 20 MG tablet Take 1 tablet (20 mg total) by mouth daily as needed for erectile dysfunction. 03/02/21   Zara Council A, PA-C    Allergies Other and Seasonal ic [cholestatin]  Family History  Problem Relation Age of Onset   Cancer Mother        leukemia   Hypertension Mother    Alcohol abuse Father    Hypertension Brother    Prostate cancer Neg Hx    Bladder Cancer Neg Hx    Kidney disease Neg Hx    Kidney cancer Neg Hx     Social History Social History   Tobacco Use   Smoking status: Former   Smokeless tobacco: Never   Tobacco comments:    quit over 40 years ago  Substance Use Topics   Alcohol use: Yes    Alcohol/week: 2.0 standard drinks    Types: 2 Cans of beer per week    Comment: paranoia about alcholism; alcohol makes me sleepy.    Drug use: No    Review of Systems  Constitutional: No fever/chills Eyes: No visual changes. ENT: No sore throat. Respiratory: Denies cough Cardiovascular: Denies chest pain Gastrointestinal: Denies abdominal pain Genitourinary: Negative for dysuria. Musculoskeletal: Negative for back pain. Skin: Negative for rash. Psychiatric: no mood changes,     ____________________________________________   PHYSICAL EXAM:  VITAL SIGNS: ED Triage Vitals  Enc Vitals Group     BP 05/24/21 1028 (!) 149/95     Pulse Rate 05/24/21 1028 (!) 102     Resp 05/24/21 1028 20     Temp 05/24/21 1028 98.4 F (36.9 C)     Temp Source 05/24/21 1028 Oral     SpO2 05/24/21  1028 98 %     Weight 05/24/21 1032 285 lb (129.3 kg)     Height 05/24/21 1032 5\' 10"  (1.778 m)     Head Circumference --      Peak Flow --      Pain Score --      Pain Loc --      Pain Edu? --      Excl. in [place]? --     Constitutional: Alert and oriented. Well appearing and in no acute distress.  Tearful Eyes: Conjunctivae are normal.  Head: Atraumatic. Nose: No congestion/rhinnorhea. Mouth/Throat: Mucous membranes are moist.   Neck:  supple no lymphadenopathy noted Cardiovascular: Normal rate, regular rhythm. Heart sounds are normal Respiratory: Normal respiratory effort.  No retractions, lungs c t a  Abd: soft nontender bs normal all 4 quad GU: deferred Musculoskeletal: FROM all extremities, warm and well perfused Neurologic:  Normal speech and language.  Skin:  Skin is warm, dry and intact. No rash noted. Psychiatric: Mood and affect are normal. Speech and behavior are normal.  ____________________________________________   LABS (all labs ordered are listed, but only abnormal results are displayed)  Labs Reviewed  COMPREHENSIVE METABOLIC PANEL - Abnormal; Notable for the following components:      Result Value   Glucose, Bld 167 (*)    All other components within normal limits  CBC WITH DIFFERENTIAL/PLATELET -  Abnormal; Notable for the following components:   Platelets 123 (*)    All other components within normal limits  CBG MONITORING, ED - Abnormal; Notable for the following components:   Glucose-Capillary 220 (*)    All other components within normal limits  TROPONIN I (HIGH SENSITIVITY) - Abnormal; Notable for the following components:   Troponin I (High Sensitivity) 22 (*)    All other components within normal limits  TROPONIN I (HIGH SENSITIVITY) - Abnormal; Notable for the following components:   Troponin I (High Sensitivity) 20 (*)    All other components within normal limits   ____________________________________________   ____________________________________________  RADIOLOGY  CT of the head  ____________________________________________   PROCEDURES  Procedure(s) performed: No  Procedures    ____________________________________________   INITIAL IMPRESSION / ASSESSMENT AND PLAN / ED COURSE  Pertinent labs & imaging results that were available during my care of the patient were reviewed by me and considered in my medical decision making (see chart for details).   Patient 65 year old male presents after syncope.  See HPI.  Physical exam shows patient to be very tearful and upset.  He is stable at this time  DDx: Syncope, CVA, MI, depression  Patient is glucose is 220 on CBG  Ct head reviewed by me confirmed by radiology to be negative for any acute abnormality.  Does show mild chronic ischemic disease in the white matter  CBC is normal, comprehensive metabolic panel has elevated glucose of 167, troponin is elevated at 22, will repeat to assess if it continues to rise Second troponin remains the same.  Do not feel patient has had any ischemic activity.  Dr. Weber Cooks consulted, he is seeing the patient.  He recommends patient be out of work for 3 weeks due to the amount of stress and grief.  Patient was notified of all results.  Dr. Lissa Hoard fax a written  prescription for Remeron for rest.  He is to follow-up with his regular doctor if not improving in 2 to 3 days.  Return emergency department worsening.  He is to return immediately if he has  any suicidal thoughts.  He was discharged in stable condition with a work note from me and dr clapacs  Johnny Maddox was evaluated in Emergency Department on 05/24/2021 for the symptoms described in the history of present illness. He was evaluated in the context of the global COVID-19 pandemic, which necessitated consideration that the patient might be at risk for infection with the SARS-CoV-2 virus that causes COVID-19. Institutional protocols and algorithms that pertain to the evaluation of patients at risk for COVID-19 are in a state of rapid change based on information released by regulatory bodies including the CDC and federal and state organizations. These policies and algorithms were followed during the patient's care in the ED.    As part of my medical decision making, I reviewed the following data within the [place] notes reviewed and incorporated, Labs reviewed , EKG interpreted tachycardia, Old chart reviewed, Radiograph reviewed , A consult was requested and obtained from this/these consultant(s) Psychiatry, Notes from prior ED visits, and [place] Controlled Substance Database  ____________________________________________   FINAL CLINICAL IMPRESSION(S) / ED DIAGNOSES  Final diagnoses:  Syncope and collapse  Situational anxiety  Grief reaction      NEW MEDICATIONS STARTED DURING THIS VISIT:  Current Discharge Medication List     START taking these medications   Details  mirtazapine (REMERON) 15 MG tablet Take 1 tablet (15 mg total) by mouth at bedtime. Qty: 30 tablet, Refills: 1         Note:  This document was prepared using Dragon voice recognition software and may include unintentional dictation errors.    Versie Starks, PA-C 05/24/21 1707    Naaman Plummer, MD 05/26/21 [phone]

## 2021-05-24 NOTE — ED Triage Notes (Signed)
PT has had severe stress recently. Pt son was murdered in May and daughter in law committed suicide two weeks ago. Pt is now raising two grandchildren now ages 87 and 23 with no help. Pt is getting no sleep due to children having night terrors.

## 2021-05-24 NOTE — Consult Note (Signed)
[facility] Consult   Reason for Consult: Consult for this 65 year old man who came to the emergency room because of passing out at work today Referring Physician:  Cheri Fowler Patient Identification: Johnny Maddox MRN:  [id number] Principal Diagnosis: Adjustment disorder with mixed anxiety and depressed mood Diagnosis:  Principal Problem:   Adjustment disorder with mixed anxiety and depressed mood Active Problems:   Complicated grief   Total Time spent with patient: 1 hour  Subjective:   Johnny Maddox is a 65 y.o. male patient admitted with "I just feel like it is too much".  HPI: Patient seen chart reviewed.  65 year old man went to work today and apparently passed out.  Patient has been going through enormous stress.  Several weeks ago his son was murdered in [place].  A couple weeks later the son's widow, the patient's daughter-in-law, committed suicide leaving to orphaned little girls whom the patient is now struggling to take care of.  Mood is anxious depressed overwhelmed and tearful at times but forcing himself to function for the sake of the children.  Trouble sleeping at night both on his own and because the little girls are having trouble sleeping.  Appetite decreased.  Patient denies suicidal thoughts.  Denies psychotic symptoms.  Not drinking or using any drugs.  He has been seeing a therapist.  Work is now adding a normal sex drive stress as he does not have anyone else really to help care for the 2 children  Past Psychiatric History: No past psychiatric history no history of medication or treatment.  No history of hospitalization or suicide attempts.  No substance abuse.  Risk to Self:   Risk to Others:   Prior Inpatient Therapy:   Prior Outpatient Therapy:    Past Medical History:  Past Medical History:  Diagnosis Date   Acute laryngitis, without mention of obstruction    Allergic rhinitis, cause unspecified    Chalazion    Cough    Dermatophytosis of foot     Diabetes mellitus without complication (HCC)    Elevated PSA    Hyperlipidemia    Hypertension    Microalbuminuria    Overweight    Seborrheic dermatitis, unspecified    Sleep apnea    Unspecified disorder of autonomic nervous system    Unspecified sleep apnea    Unspecified venous (peripheral) insufficiency    Unspecified vitamin D deficiency     Past Surgical History:  Procedure Laterality Date   CIRCUMCISION     HYDROCELE EXCISION Right 09/23/2016   Procedure: HYDROCELECTOMY ADULT ;  Surgeon: Hollice Espy, MD;  Location: [facility] ORS;  Service: Urology;  Laterality: Right;  INGUINAL APPROACH   ROTATOR CUFF REPAIR  R 2011, L 2006   L and R shoulder   TONSILLECTOMY AND ADENOIDECTOMY     Family History:  Family History  Problem Relation Age of Onset   Cancer Mother        leukemia   Hypertension Mother    Alcohol abuse Father    Hypertension Brother    Prostate cancer Neg Hx    Bladder Cancer Neg Hx    Kidney disease Neg Hx    Kidney cancer Neg Hx    Family Psychiatric  History: None reported Social History:  Social History   Substance and Sexual Activity  Alcohol Use Yes   Alcohol/week: 2.0 standard drinks   Types: 2 Cans of beer per week   Comment: paranoia about alcholism; alcohol makes me sleepy.  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   Drug use: No   Sexual activity: Yes     Partners: Female    Birth control/protection: Condom  Other Topics Concern   Not on file  Social History Narrative   Lives alone. Adult son is in the [employer].  His daughter lives with her mother (his ex-wife).    Separated; after 6 years of marriage. Not dating.   Always uses seat belts.    Smoke alarm in the home.    No guns in the home.    Exercise: Moderate 3 x week, 20-30 minutes,walking, member of the [facility].   Caffeine use: Moderate.   Social Determinants of Health   Financial Resource Strain: Not on file  Food Insecurity: Not on file  Transportation Needs: Not on file  Physical Activity: Not on file  Stress: Not on file  Social Connections: Not on file   Additional Social History:    Allergies:   Allergies  Allergen Reactions   Other Anaphylaxis    Mushrooms   Seasonal Ic [Cholestatin]     Labs:  Results for orders placed or performed during the hospital encounter of 05/24/21 (from the past 48 hour(s))  CBG monitoring, ED     Status: Abnormal   Collection Time: 05/24/21 10:26 AM  Result Value Ref Range   Glucose-Capillary 220 (H) 70 - 99 mg/dL    Comment: Glucose reference range applies only to samples taken after fasting for at least 8 hours.  Comprehensive metabolic panel     Status: Abnormal   Collection Time: 05/24/21 12:57 PM  Result Value Ref Range   Sodium 138 135 - 145 mmol/L   Potassium 3.6 3.5 - 5.1 mmol/L   Chloride 105 98 - 111 mmol/L   CO2 25 22 - 32 mmol/L   Glucose, Bld 167 (H) 70 - 99 mg/dL    Comment: Glucose reference range applies only to samples taken after fasting for at least 8 hours.   BUN 12 8 - 23 mg/dL   Creatinine, Ser 0.88 0.61 - 1.24 mg/dL   Calcium 9.2 8.9 - 10.3 mg/dL   Total Protein 6.6 6.5 - 8.1 g/dL   Albumin 4.1 3.5 - 5.0 g/dL   AST 25 15 - 41 U/L   ALT 22 0 - 44 U/L   Alkaline Phosphatase 39 38 - 126 U/L   Total Bilirubin 0.7 0.3 - 1.2 mg/dL   GFR, Estimated >60 >60 mL/min    Comment: (NOTE) Calculated using the  CKD-EPI Creatinine Equation (2021)    Anion gap 8 5 - 15    Comment: Performed at [facility], [address], [place], [place] [address]  Troponin I (High Sensitivity)     Status: Abnormal   Collection Time: 05/24/21 12:57  PM  Result Value Ref Range   Troponin I (High Sensitivity) 22 (H) <18 ng/L    Comment: (NOTE) Elevated high sensitivity troponin I (hsTnI) values and significant  changes across serial measurements may suggest ACS but many other  chronic and acute conditions are known to elevate hsTnI results.  Refer to the "Links" section for chest pain algorithms and additional  guidance. Performed at [facility], [address] [place], [place] [address]   CBC with Differential     Status: Abnormal   Collection Time: 05/24/21 12:57 PM  Result Value Ref Range   WBC 4.7 4.0 - 10.5 K/uL   RBC 4.95 4.22 - 5.81 MIL/uL   Hemoglobin 13.5 13.0 - 17.0 g/dL   HCT 41.3 39.0 - 52.0 %   MCV 83.4 80.0 - 100.0 fL   MCH 27.3 26.0 - 34.0 pg   MCHC 32.7 30.0 - 36.0 g/dL   RDW 14.1 11.5 - 15.5 %   Platelets 123 (L) 150 - 400 K/uL   nRBC 0.0 0.0 - 0.2 %   Neutrophils Relative % 64 %   Neutro Abs 3.0 1.7 - 7.7 K/uL   Lymphocytes Relative 26 %   Lymphs Abs 1.2 0.7 - 4.0 K/uL   Monocytes Relative 8 %   Monocytes Absolute 0.4 0.1 - 1.0 K/uL   Eosinophils Relative 2 %   Eosinophils Absolute 0.1 0.0 - 0.5 K/uL   Basophils Relative 0 %   Basophils Absolute 0.0 0.0 - 0.1 K/uL   Immature Granulocytes 0 %   Abs Immature Granulocytes 0.01 0.00 - 0.07 K/uL    Comment: Performed at [facility], [place], [place] [address]  Troponin I (High Sensitivity)     Status: Abnormal   Collection Time: 05/24/21  2:19 PM  Result Value Ref Range   Troponin I (High Sensitivity) 20 (H) <18 ng/L    Comment: (NOTE) Elevated high sensitivity troponin I (hsTnI) values and significant  changes across serial measurements may suggest ACS but many other  chronic and  acute conditions are known to elevate hsTnI results.  Refer to the "Links" section for chest pain algorithms and additional  guidance. Performed at [facility], [address] [place], [place] [address]     Current Facility-Administered Medications  Medication Dose Route Frequency Provider Last Rate Last Admin   mirtazapine (REMERON) tablet 15 mg  15 mg Oral QHS Tiaira Arambula, Madie Reno, MD       Current Outpatient Medications  Medication Sig Dispense Refill   aspirin EC 81 MG tablet Take 81 mg by mouth every evening.      atorvastatin (LIPITOR) 40 MG tablet Take 40 mg by mouth every evening.     Continuous Blood Gluc Receiver (FREESTYLE LIBRE READER) DEVI 1 Device by Other route 3 (three) times daily. E11.65 1 each 0   Continuous Blood Gluc Sensor (FREESTYLE LIBRE 14 DAY SENSOR) MISC USE TO TEST BLOOD SUGAR, CHANGING EVERY 14 DAYS  12   Dulaglutide 1.5 MG/0.5ML SOPN Inject 1.5 mg into the skin every Saturday.     finasteride (PROSCAR) 5 MG tablet Take 1 tablet (5 mg total) by mouth daily. 90 tablet 3   fluconazole (DIFLUCAN) 100 MG tablet Take 1 tablet (100 mg total) by mouth daily. X 7 days 7 tablet 3   furosemide (LASIX) 20 MG tablet Take 20 mg by mouth daily.  3   glucose blood (ONETOUCH VERIO) test strip Use 1 strip via meter three times  daily as directed.     HUMALOG KWIKPEN 100 UNIT/ML KiwkPen INJECT 12 UNITS SUBCUTANEOUSLY DAILY AT 7 AM.  12   Insulin Glargine (LANTUS SOLOSTAR) 100 UNIT/ML Solostar Pen Inject 22 Units into the skin at bedtime.     Insulin Pen Needle (B-D ULTRAFINE III SHORT PEN) 31G X 8 MM MISC Use 4 (four) times daily     lisinopril (ZESTRIL) 40 MG tablet Take by mouth.     metFORMIN (GLUCOPHAGE-XR) 500 MG 24 hr tablet TAKE 2 TABLETS (1,000 MG TOTAL) BY MOUTH DAILY WITH DINNER.  5   mirtazapine (REMERON) 15 MG tablet Take 1 tablet (15 mg total) by mouth at bedtime. 30 tablet 1   Multiple Vitamin (MULTI-VITAMINS) TABS Take by mouth.     nystatin cream  (MYCOSTATIN) Apply 1 application topically 2 (two) times daily. 30 g 3   tadalafil (CIALIS) 20 MG tablet Take 1 tablet (20 mg total) by mouth daily as needed for erectile dysfunction. 90 tablet 1    Musculoskeletal: Strength & Muscle Tone: within normal limits Gait & Station: normal Patient leans: N/A            Psychiatric Specialty Exam:  Presentation  General Appearance:  No data recorded Eye Contact: No data recorded Speech: No data recorded Speech Volume: No data recorded Handedness: No data recorded  Mood and Affect  Mood: No data recorded Affect: No data recorded  Thought Process  Thought Processes: No data recorded Descriptions of Associations:No data recorded Orientation:No data recorded Thought Content:No data recorded History of Schizophrenia/Schizoaffective disorder:No data recorded Duration of Psychotic Symptoms:No data recorded Hallucinations:No data recorded Ideas of Reference:No data recorded Suicidal Thoughts:No data recorded Homicidal Thoughts:No data recorded  Sensorium  Memory: No data recorded Judgment: No data recorded Insight: No data recorded  Executive Functions  Concentration: No data recorded Attention Span: No data recorded Recall: No data recorded Fund of Knowledge: No data recorded Language: No data recorded  Psychomotor Activity  Psychomotor Activity: No data recorded  Assets  Assets: No data recorded  Sleep  Sleep: No data recorded  Physical Exam: Physical Exam Vitals and nursing note reviewed.  Constitutional:      Appearance: Normal appearance.  HENT:     Head: Normocephalic and atraumatic.     Mouth/Throat:     Pharynx: Oropharynx is clear.  Eyes:     Pupils: Pupils are equal, round, and reactive to light.  Cardiovascular:     Rate and Rhythm: Normal rate and regular rhythm.  Pulmonary:     Effort: Pulmonary effort is normal.     Breath sounds: Normal breath sounds.  Abdominal:      General: Abdomen is flat.     Palpations: Abdomen is soft.  Musculoskeletal:        General: Normal range of motion.  Skin:    General: Skin is warm and dry.  Neurological:     General: No focal deficit present.     Mental Status: He is alert. Mental status is at baseline.  Psychiatric:        Attention and Perception: Attention normal.        Mood and Affect: Mood is anxious. Affect is tearful.        Speech: Speech is delayed.        Behavior: Behavior is cooperative.        Thought Content: Thought content normal.        Cognition and Memory: Cognition normal.  Judgment: Judgment normal.   Review of Systems  Constitutional: Negative.   HENT: Negative.    Eyes: Negative.   Respiratory: Negative.    Cardiovascular: Negative.   Gastrointestinal: Negative.   Musculoskeletal: Negative.   Skin: Negative.   Neurological: Negative.   Psychiatric/Behavioral:  Positive for depression. Negative for hallucinations, memory loss, substance abuse and suicidal ideas. The patient is nervous/anxious and has insomnia.   Blood pressure (!) 167/114, pulse 100, temperature 98.4 F (36.9 C), temperature source Oral, resp. rate 16, height 5\' 10"  (1.778 m), weight 129.3 kg, SpO2 97 %. Body mass index is 40.89 kg/m.  Treatment Plan Summary: Medication management and Plan 65 year old man having anxiety and depression symptoms accompanied by symptoms of poor appetite and poor sleep with anxiety symptoms all related to the profound stress and grief he is going through.  Poor self-care resulting in passing out today.  Not able to keep up with monitoring his blood sugars or blood pressure.  Patient says he feels like without time off from work he cannot recover which makes sense.  At his suggestion we are providing a note supporting the idea that at least 3 weeks off from work would be crucial to his recovery and stabilizing his health.  Also I have recommended starting a low-dose of mirtazapine for  sleep appetite and depression.  15 mg at night.  Patient agreeable.  No indication for IVC or hospitalization.  Supportive counseling completed.  Case reviewed with emergency room treatment team  Disposition: No evidence of imminent risk to self or others at present.   Patient does not meet criteria for psychiatric inpatient admission.  Alethia Berthold, MD 05/24/2021 5:56 PM

## 2021-05-24 NOTE — Discharge Instructions (Signed)
Follow up with your regular doctor if not improving in 3 days Return to the ER if worsening Return if any suicidal thoughts Try to get rest and eat healthy

## 2021-05-28 ENCOUNTER — Encounter (INDEPENDENT_AMBULATORY_CARE_PROVIDER_SITE_OTHER): Payer: Managed Care, Other (non HMO) | Admitting: Ophthalmology

## 2021-05-28 ENCOUNTER — Other Ambulatory Visit: Payer: Self-pay

## 2021-05-28 DIAGNOSIS — H33303 Unspecified retinal break, bilateral: Secondary | ICD-10-CM | POA: Diagnosis not present

## 2021-05-28 DIAGNOSIS — H43813 Vitreous degeneration, bilateral: Secondary | ICD-10-CM | POA: Diagnosis not present

## 2021-05-28 DIAGNOSIS — H35033 Hypertensive retinopathy, bilateral: Secondary | ICD-10-CM | POA: Diagnosis not present

## 2021-05-28 DIAGNOSIS — I10 Essential (primary) hypertension: Secondary | ICD-10-CM | POA: Diagnosis not present

## 2021-06-15 ENCOUNTER — Ambulatory Visit: Payer: Self-pay | Admitting: Cardiology

## 2021-06-19 ENCOUNTER — Encounter: Payer: Self-pay | Admitting: Internal Medicine

## 2021-06-19 ENCOUNTER — Ambulatory Visit (INDEPENDENT_AMBULATORY_CARE_PROVIDER_SITE_OTHER): Payer: Managed Care, Other (non HMO) | Admitting: Internal Medicine

## 2021-06-19 ENCOUNTER — Other Ambulatory Visit: Payer: Self-pay

## 2021-06-19 VITALS — BP 130/82 | HR 92 | Temp 98.0°F | Ht 70.0 in | Wt 285.4 lb

## 2021-06-19 DIAGNOSIS — G4733 Obstructive sleep apnea (adult) (pediatric): Secondary | ICD-10-CM

## 2021-06-19 DIAGNOSIS — R059 Cough, unspecified: Secondary | ICD-10-CM | POA: Diagnosis not present

## 2021-06-19 MED ORDER — BREZTRI AEROSPHERE 160-9-4.8 MCG/ACT IN AERO: 2.0000 | INHALATION_SPRAY | Freq: Two times a day (BID) | RESPIRATORY_TRACT | 0 refills | Status: DC

## 2021-06-19 MED ORDER — PROMETHAZINE-CODEINE 6.25-10 MG/5ML PO SYRP: ORAL_SOLUTION | ORAL | 0 refills | Status: DC

## 2021-06-19 MED ORDER — PROMETHAZINE-CODEINE 6.25-10 MG/5ML PO SYRP: 5.0000 mL | ORAL_SOLUTION | Freq: Four times a day (QID) | ORAL | 0 refills | Status: DC | PRN

## 2021-06-19 NOTE — Patient Instructions (Signed)
Order- sample x 2 Breztri inhaler   inhale 2 puffs then rinse mouth, twice daily  Order- schedule HST    dx OSA  Please call us for results and recommendations about 2 weeks after your sleep study  Order- record release seeking CXR report from 05/17/21 done at [facility]  We will be recommending Dr Vista Lawman change medication from lisinopril to an ARB or other with less tendency to cause dry cough.  Script sent for codeine cough syrup

## 2021-06-19 NOTE — Progress Notes (Signed)
06/19/21- 63 yoM, Veteran,  former smoker (minimal) for sleep evaluation courtesy of Dr Vista Lawman with concern of OSA and ongoing cough. 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 Medical problem list includes HTN, DM2, Obesity, BPH, Cough, Sarcoid, Depression,  Epworth score-5 Bodyweight today- Covid vax-4 Phizer -----Sleep study completed 10+ years ago. Not currently on C-Pap He is most concerned about annoying dry cough x 2 months. Had CXR for this at [facility] about 6 weeks ago. Report requested- not in CE. Treated then with Zith, prednisone, benzonatate- little effect.Albuterol inhaler little help.  Note chronic Lisinopril. Not very short of breath.  CXR July [facility]- not in CE Sleep- Aware of loud snoring. Falls asleep immediately. No sleep meds. Much stress- son was killed, widow then suicide in [place]. Pt is having to resolve estate and care for small children.   Prior to Admission medications   Medication Sig Start Date End Date Taking? Authorizing Provider  aspirin EC 81 MG tablet Take 81 mg by mouth every evening.    Yes [provider]  atorvastatin (LIPITOR) 40 MG tablet Take 40 mg by mouth every evening. 06/05/16  Yes [provider]  Budeson-Glycopyrrol-Formoterol (BREZTRI AEROSPHERE) 160-9-4.8 MCG/ACT AERO Inhale 2 puffs into the lungs in the morning and at bedtime. 06/19/21  Yes Baird Lyons D, MD  Continuous Blood Gluc Receiver (FREESTYLE LIBRE READER) DEVI 1 Device by Other route 3 (three) times daily. E11.65 10/08/19  Yes Stallings, Zoe A, MD  Continuous Blood Gluc Sensor (FREESTYLE LIBRE 14 DAY SENSOR) MISC USE TO TEST BLOOD SUGAR, CHANGING EVERY 14 DAYS 06/01/18  Yes [provider]  finasteride (PROSCAR) 5 MG tablet Take 1 tablet (5 mg total) by mouth daily. 05/23/21  Yes McGowan, Larene Beach A, PA-C  furosemide (LASIX) 20 MG tablet Take 20 mg by mouth daily. 08/28/18  Yes [provider]  glucose blood (ONETOUCH VERIO) test strip Use 1 strip via  meter three times daily as directed. 09/28/15  Yes [provider]  HUMALOG KWIKPEN 100 UNIT/ML KiwkPen INJECT 12 UNITS SUBCUTANEOUSLY DAILY AT 7 AM. 06/05/16  Yes [provider]  Insulin Glargine (LANTUS SOLOSTAR) 100 UNIT/ML Solostar Pen Inject 22 Units into the skin at bedtime. 09/29/18  Yes [provider]  Insulin Pen Needle (B-D ULTRAFINE III SHORT PEN) 31G X 8 MM MISC Use 4 (four) times daily 01/13/19  Yes [provider]  metFORMIN (GLUCOPHAGE-XR) 500 MG 24 hr tablet TAKE 2 TABLETS (1,000 MG TOTAL) BY MOUTH DAILY WITH DINNER. 07/03/18  Yes [provider]  Multiple Vitamin (MULTI-VITAMINS) TABS Take by mouth.   Yes [provider]  nystatin cream (MYCOSTATIN) Apply 1 application topically 2 (two) times daily. 03/02/21  Yes McGowan, Larene Beach A, PA-C  tadalafil (CIALIS) 20 MG tablet Take 1 tablet (20 mg total) by mouth daily as needed for erectile dysfunction. 03/02/21  Yes McGowan, Larene Beach A, PA-C  Dulaglutide 1.5 MG/0.5ML SOPN Inject 1.5 mg into the skin every Saturday. Patient not taking: Reported on 06/19/2021    [provider]  fluconazole (DIFLUCAN) 100 MG tablet Take 1 tablet (100 mg total) by mouth daily. X 7 days Patient not taking: Reported on 06/19/2021 03/02/21   Zara Council A, PA-C  lisinopril (ZESTRIL) 40 MG tablet Take by mouth. Patient not taking: Reported on 06/19/2021 11/16/20   [provider]  mirtazapine (REMERON) 15 MG tablet Take 1 tablet (15 mg total) by mouth at bedtime. Patient not taking: Reported on 06/19/2021 05/24/21   Clapacs, Madie Reno, MD  promethazine-codeine (PHENERGAN WITH CODEINE) 6.25-10 MG/5ML syrup 5 ml every 6 hours as needed for cough 06/19/21   Baird Lyons D, MD   Past Medical History:  Diagnosis Date   Acute laryngitis, without mention of obstruction    Allergic rhinitis, cause unspecified    Chalazion    Cough    Dermatophytosis of foot    Diabetes mellitus without complication (HCC)     Elevated PSA    Hyperlipidemia    Hypertension    Microalbuminuria    Overweight    Seborrheic dermatitis, unspecified    Sleep apnea    Unspecified disorder of autonomic nervous system    Unspecified sleep apnea    Unspecified venous (peripheral) insufficiency    Unspecified vitamin D deficiency    Past Surgical History:  Procedure Laterality Date   CIRCUMCISION     HYDROCELE EXCISION Right 09/23/2016   Procedure: HYDROCELECTOMY ADULT ;  Surgeon: Hollice Espy, MD;  Location: [facility] ORS;  Service: Urology;  Laterality: Right;  INGUINAL APPROACH   ROTATOR CUFF REPAIR  R 2011, L 2006   L and R shoulder   TONSILLECTOMY AND ADENOIDECTOMY     Family History  Problem Relation Age of Onset   Cancer Mother        leukemia   Hypertension Mother    Alcohol abuse Father    Hypertension Brother    Prostate cancer Neg Hx    Bladder Cancer Neg Hx    Kidney disease Neg Hx    Kidney cancer Neg Hx    Social History   Socioeconomic History   Marital status: Divorced    Spouse name: n/a   Number of children: 2   Years of education: 18   Highest education level: Not on file  Occupational History   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   Drug use: No   Sexual activity: Yes    Partners: Female    Birth control/protection: Condom  Other Topics Concern   Not on file  Social History Narrative   Lives alone. Adult son is in the [employer].  His daughter lives with her mother (his ex-wife).    Separated; after 6 years of marriage. Not dating.   Always uses seat belts.    Smoke alarm in the home.    No guns in the home.    Exercise: Moderate 3 x week, 20-30  minutes,walking, member of the [facility].   Caffeine use: Moderate.   Social Determinants of Health   Financial Resource Strain: Not on file  Food Insecurity: Not on file  Transportation Needs: Not on file  Physical Activity: Not on file  Stress: Not on file  Social Connections: Not on file  Intimate Partner Violence: Not on file   ROS-see HPI  + = positive Constitutional:    weight loss, night sweats, fevers, chills, fatigue, lassitude. HEENT:    headaches, difficulty swallowing, tooth/dental problems, sore throat,       sneezing, itching, ear ache, nasal congestion, post nasal drip, snoring CV:    chest pain, orthopnea, PND, swelling in lower extremities, anasarca,  dizziness, palpitations Resp:   shortness of breath with exertion or at rest.                productive cough,   +non-productive cough, coughing up of blood.              change in color of mucus.  wheezing.   Skin:    rash or lesions. GI:  No-   heartburn, indigestion, abdominal pain, nausea, vomiting, diarrhea,                 change in bowel habits, loss of appetite GU: dysuria, change in color of urine, no urgency or frequency.   flank pain. MS:   joint pain, stiffness, decreased range of motion, back pain. Neuro-     nothing unusual Psych:  change in mood or affect.  depression or anxiety.   memory loss.   OBJ- Physical Exam General- Alert, Oriented, Affect-appropriate, Distress- none acute, + obese Skin- rash-none, lesions- none, excoriation- none Lymphadenopathy- none Head- atraumatic            Eyes- Gross vision intact, PERRLA, conjunctivae and secretions clear            Ears- Hearing, canals-normal            Nose- Clear, no-Septal dev, mucus, polyps, erosion, perforation             Throat- Mallampati III , mucosa clear , drainage- none, tonsils- atrophic, = teeth Neck- flexible , trachea midline, no stridor , thyroid nl, carotid no bruit Chest - symmetrical excursion ,  unlabored           Heart/CV- RRR , no murmur , no gallop  , no rub, nl s1 s2                           - JVD- none , edema- none, stasis changes- none, varices- none           Lung- clear to P&A, wheeze- none, cough+dry , dullness-none, rub- none           Chest wall-  Abd-  Br/ Gen/ Rectal- Not done, not indicated Extrem- cyanosis- none, clubbing, none, atrophy- none, strength- nl Neuro- grossly intact to observation

## 2021-06-21 DIAGNOSIS — R059 Cough, unspecified: Secondary | ICD-10-CM | POA: Insufficient documentation

## 2021-06-21 NOTE — Assessment & Plan Note (Signed)
Discussed medical issues, tessting, treatment and driving. Plan- sleep study

## 2021-06-21 NOTE — Assessment & Plan Note (Signed)
May have had cough syncope event. We need report of recent CXR- requested.  He may need to change from Lisinopril.  He minimizes smoking hx, but we may need to consider PFT.  Plan- samples Breztri, Rx cough syrup with discussion

## 2021-06-25 NOTE — Progress Notes (Signed)
Patient referred by [name], MD for syncoope  Subjective:   Johnny Maddox, male    DOB: 1956/02/06, 65 y.o.   MRN: [id number]   Chief Complaint  Patient presents with   Loss of Consciousness     HPI  65 y.o. 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 He has hypertension, type 2 DM, hyperlipidemia for at least 5 years. He has had a lot of stressors, both at home and work. Four weeks ago, patient has an episode of loss of consciousness while at work. Patient woke up that morning at 6 AM, as usual. He did not have any time to eat proper breakfast due to time constraints. He was at work, remembers getting up from his chair to go get a file. Next thing he remembers that he had a lot of people standing around him. He was confused on regaining consciousness, denies losing bowel or bladder tone. Patient was taken to [facility] regional ED. Trop HS was mildly elevated and flat at 22,20. No other significant abnormalities were noted.   He has not had any more episodes since then. Prior to this event, he has had dizziness, but never had syncope. At baseline, patient denies any chest pain, shortness of breath symptoms. He has leg edema, which improved with compression stockings.    Past Medical History:  Diagnosis Date   Acute laryngitis, without mention of obstruction    Allergic rhinitis, cause unspecified    Chalazion    Cough    Dermatophytosis of foot    Diabetes mellitus without complication (HCC)    Elevated PSA    Hyperlipidemia    Hypertension    Microalbuminuria    Overweight    Seborrheic dermatitis, unspecified    Sleep apnea    Unspecified disorder of autonomic nervous system    Unspecified sleep apnea    Unspecified venous (peripheral) insufficiency    Unspecified vitamin D deficiency      Past Surgical History:  Procedure Laterality Date    CIRCUMCISION     HYDROCELE EXCISION Right 09/23/2016   Procedure: HYDROCELECTOMY ADULT ;  Surgeon: Hollice Espy, MD;  Location: [facility] ORS;  Service: Urology;  Laterality: Right;  INGUINAL APPROACH   ROTATOR CUFF REPAIR  R 2011, L 2006   L and R shoulder   TONSILLECTOMY AND ADENOIDECTOMY       Social History   Tobacco Use  Smoking Status Former  Smokeless Tobacco Never  Tobacco Comments   quit over 40 years ago    Social History   Substance and Sexual Activity  Alcohol Use Yes   Alcohol/week: 2.0 standard drinks   Types: 2 Cans of beer per week   Comment: paranoia about alcholism; alcohol makes me sleepy.     Family History  Problem Relation Age of Onset   Cancer Mother        leukemia   Hypertension Mother    Alcohol abuse Father    Hypertension Brother    Prostate cancer Neg Hx    Bladder Cancer Neg Hx    Kidney disease Neg Hx    Kidney cancer Neg Hx      Current Outpatient Medications on File Prior to Visit  Medication Sig Dispense Refill   aspirin EC 81 MG tablet Take 81 mg by mouth every evening.      atorvastatin (LIPITOR) 40 MG tablet Take 40 mg  by mouth every evening.     Budeson-Glycopyrrol-Formoterol (BREZTRI AEROSPHERE) 160-9-4.8 MCG/ACT AERO Inhale 2 puffs into the lungs in the morning and at bedtime. 5.9 g 0   Continuous Blood Gluc Receiver (FREESTYLE LIBRE READER) DEVI 1 Device by Other route 3 (three) times daily. E11.65 1 each 0   Continuous Blood Gluc Sensor (FREESTYLE LIBRE 14 DAY SENSOR) MISC USE TO TEST BLOOD SUGAR, CHANGING EVERY 14 DAYS  12   Dulaglutide 1.5 MG/0.5ML SOPN Inject 1.5 mg into the skin every Saturday.     finasteride (PROSCAR) 5 MG tablet Take 1 tablet (5 mg total) by mouth daily. 90 tablet 3   furosemide (LASIX) 20 MG tablet Take 20 mg by mouth daily.  3   glucose blood (ONETOUCH VERIO) test strip Use 1 strip via meter three times daily as directed.     HUMALOG KWIKPEN 100 UNIT/ML KiwkPen INJECT 12 UNITS SUBCUTANEOUSLY DAILY AT  7 AM.  12   Insulin Glargine (LANTUS SOLOSTAR) 100 UNIT/ML Solostar Pen Inject 22 Units into the skin at bedtime.     Insulin Pen Needle (B-D ULTRAFINE III SHORT PEN) 31G X 8 MM MISC Use 4 (four) times daily     lisinopril (ZESTRIL) 40 MG tablet Take by mouth.     metFORMIN (GLUCOPHAGE-XR) 500 MG 24 hr tablet TAKE 2 TABLETS (1,000 MG TOTAL) BY MOUTH DAILY WITH DINNER.  5   Multiple Vitamin (MULTI-VITAMINS) TABS Take by mouth.     No current facility-administered medications on file prior to visit.    Cardiovascular and other pertinent studies:  EKG 06/26/2021: Sinus rhythm 86 bpm  Left ventricular hypertrophy IVCD Nonspecific T-abnormality   Recent labs: 05/24/2021: Glucose 167, BUN/Cr 12/0.88. EGFR >60. Na/K 138/36. Rest of the CMP normal H/H 13.5/4.3. MCV 83.4. Platelets 123 Trop HS 22, 20   10/2020: HbA1C 11.3%   Review of Systems  Cardiovascular:  Positive for leg swelling and syncope. Negative for chest pain, dyspnea on exertion and palpitations.        Vitals:   06/26/21 0926  BP: 138/85  Pulse: 93  Resp: 16  Temp: 98.2 F (36.8 C)  SpO2: 97%    Orthostatic VS for the past 72 hrs (Last 3 readings):  Orthostatic BP Patient Position BP Location Cuff Size Orthostatic Pulse  06/26/21 0938 (!) 148/94 Sitting Left Arm Large 92  06/26/21 0926 (!) 154/99 Supine Left Arm Large 88     Body mass index is 40.95 kg/m. Filed Weights   06/26/21 0926  Weight: 285 lb 6.4 oz (129.5 kg)     Objective:   Physical Exam Vitals and nursing note reviewed.  Constitutional:      General: He is not in acute distress. Neck:     Vascular: No JVD.  Cardiovascular:     Rate and Rhythm: Normal rate and regular rhythm.     Heart sounds: Normal heart sounds. No murmur heard. Pulmonary:     Effort: Pulmonary effort is normal.     Breath sounds: Normal breath sounds. No wheezing or rales.  Musculoskeletal:     Right lower leg: Edema (1+) present.     Left lower leg: Edema  (1+) present.  Skin:    Comments: Discoloration and scarring bilateral legs medial aspect, stasis dermatitis         Assessment & Recommendations:   65 y.o. African American male with hypertension, hyperlipidemia, uncontrolled type 2 DM, referred for evaluation of syncope  Syncope: Most likely etiology is vasovagal syncope. Cardiac etiology  less likely, but cannot be excluded. IN light of mild troponin elevation on presentation, ischemia is also in the differential. Recommend exercise nuclear stress test, echocardiogram, two week live cardiac telemetry, CT cardiac scoring.   Recommend wearing compression stockings, discussed counter pressure maneuvers, increased fluid intake. Compression stockings should also help what appears to be venous insufficiency.,   Further recommendations after above testing   Thank you for referring the patient to [place]. Please feel free to contact with any questions.   Nigel Mormon, MD Pager: [phone] Office: [phone]

## 2021-06-26 ENCOUNTER — Other Ambulatory Visit: Payer: Self-pay

## 2021-06-26 ENCOUNTER — Ambulatory Visit: Payer: Managed Care, Other (non HMO) | Admitting: Cardiology

## 2021-06-26 ENCOUNTER — Encounter: Payer: Self-pay | Admitting: Cardiology

## 2021-06-26 VITALS — Temp 98.2°F | Resp 16 | Ht 70.0 in | Wt 285.4 lb

## 2021-06-26 DIAGNOSIS — R55 Syncope and collapse: Secondary | ICD-10-CM

## 2021-06-26 DIAGNOSIS — I152 Hypertension secondary to endocrine disorders: Secondary | ICD-10-CM

## 2021-06-26 DIAGNOSIS — R7989 Other specified abnormal findings of blood chemistry: Secondary | ICD-10-CM | POA: Insufficient documentation

## 2021-06-26 DIAGNOSIS — R778 Other specified abnormalities of plasma proteins: Secondary | ICD-10-CM

## 2021-07-20 ENCOUNTER — Ambulatory Visit: Payer: Self-pay | Admitting: Cardiology

## 2021-07-23 ENCOUNTER — Other Ambulatory Visit: Payer: Managed Care, Other (non HMO)

## 2021-07-23 ENCOUNTER — Telehealth: Payer: Self-pay | Admitting: Internal Medicine

## 2021-07-23 ENCOUNTER — Inpatient Hospital Stay: Payer: Managed Care, Other (non HMO)

## 2021-07-23 DIAGNOSIS — R55 Syncope and collapse: Secondary | ICD-10-CM

## 2021-07-23 MED ORDER — BREZTRI AEROSPHERE 160-9-4.8 MCG/ACT IN AERO: 2.0000 | INHALATION_SPRAY | Freq: Two times a day (BID) | RESPIRATORY_TRACT | 5 refills | Status: DC

## 2021-07-23 NOTE — Telephone Encounter (Signed)
ATC, left VM. I wentv ahead and sent in refill for Breztri to preferred pharmacy if patient call back. Nothing further needed.

## 2021-07-26 ENCOUNTER — Other Ambulatory Visit: Payer: Managed Care, Other (non HMO)

## 2021-08-02 ENCOUNTER — Ambulatory Visit: Payer: Managed Care, Other (non HMO) | Admitting: Cardiology

## 2021-08-02 ENCOUNTER — Institutional Professional Consult (permissible substitution): Payer: Managed Care, Other (non HMO) | Admitting: Pulmonary Disease

## 2021-08-08 ENCOUNTER — Inpatient Hospital Stay: Admission: RE | Admit: 2021-08-08 | Payer: Managed Care, Other (non HMO) | Source: Ambulatory Visit

## 2021-08-08 ENCOUNTER — Other Ambulatory Visit: Payer: Managed Care, Other (non HMO)

## 2021-08-13 ENCOUNTER — Other Ambulatory Visit: Payer: Managed Care, Other (non HMO)

## 2021-08-13 ENCOUNTER — Inpatient Hospital Stay: Payer: Managed Care, Other (non HMO)

## 2021-08-17 ENCOUNTER — Ambulatory Visit: Payer: Managed Care, Other (non HMO)

## 2021-08-17 ENCOUNTER — Other Ambulatory Visit: Payer: Self-pay

## 2021-08-17 DIAGNOSIS — G4733 Obstructive sleep apnea (adult) (pediatric): Secondary | ICD-10-CM

## 2021-08-17 DIAGNOSIS — R0683 Snoring: Secondary | ICD-10-CM | POA: Diagnosis not present

## 2021-08-24 ENCOUNTER — Inpatient Hospital Stay: Payer: Managed Care, Other (non HMO)

## 2021-08-24 ENCOUNTER — Other Ambulatory Visit: Payer: Managed Care, Other (non HMO)

## 2021-08-24 DIAGNOSIS — G4733 Obstructive sleep apnea (adult) (pediatric): Secondary | ICD-10-CM | POA: Diagnosis not present

## 2021-08-27 ENCOUNTER — Ambulatory Visit: Payer: Managed Care, Other (non HMO) | Admitting: Cardiology

## 2021-08-27 ENCOUNTER — Other Ambulatory Visit: Payer: Self-pay

## 2021-08-27 ENCOUNTER — Ambulatory Visit: Payer: Managed Care, Other (non HMO)

## 2021-08-28 ENCOUNTER — Ambulatory Visit
Admission: RE | Admit: 2021-08-28 | Discharge: 2021-08-28 | Disposition: A | Discharge status: Home / Self Care | Payer: No Typology Code available for payment source | Source: Ambulatory Visit | Attending: Cardiology | Admitting: Cardiology

## 2021-08-28 DIAGNOSIS — I152 Hypertension secondary to endocrine disorders: Secondary | ICD-10-CM

## 2021-08-28 DIAGNOSIS — R778 Other specified abnormalities of plasma proteins: Secondary | ICD-10-CM

## 2021-08-28 DIAGNOSIS — E1159 Type 2 diabetes mellitus with other circulatory complications: Secondary | ICD-10-CM

## 2021-08-29 ENCOUNTER — Other Ambulatory Visit: Payer: Self-pay

## 2021-08-29 ENCOUNTER — Ambulatory Visit: Payer: Managed Care, Other (non HMO)

## 2021-08-29 DIAGNOSIS — R55 Syncope and collapse: Secondary | ICD-10-CM

## 2021-09-05 ENCOUNTER — Other Ambulatory Visit: Payer: Managed Care, Other (non HMO)

## 2021-09-10 ENCOUNTER — Ambulatory Visit: Payer: Managed Care, Other (non HMO) | Admitting: Cardiology

## 2021-09-10 NOTE — Progress Notes (Incomplete)
Patient referred by [name], MD for syncoope  Subjective:   Johnny Maddox, male    DOB: Jul 06, 1956, 65 y.o.   MRN: [id number]   No chief complaint on file.    HPI  65 y.o. 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 He has hypertension, type 2 DM, hyperlipidemia for at least 5 years. He has had a lot of stressors, both at home and work. Four weeks ago, patient has an episode of loss of consciousness while at work. Patient woke up that morning at 6 AM, as usual. He did not have any time to eat proper breakfast due to time constraints. He was at work, remembers getting up from his chair to go get a file. Next thing he remembers that he had a lot of people standing around him. He was confused on regaining consciousness, denies losing bowel or bladder tone. Patient was taken to [facility] regional ED. Trop HS was mildly elevated and flat at 22,20. No other significant abnormalities were noted.   He has not had any more episodes since then. Prior to this event, he has had dizziness, but never had syncope. At baseline, patient denies any chest pain, shortness of breath symptoms. He has leg edema, which improved with compression stockings.    Current Outpatient Medications on File Prior to Visit  Medication Sig Dispense Refill   aspirin EC 81 MG tablet Take 81 mg by mouth every evening.      atorvastatin (LIPITOR) 40 MG tablet Take 40 mg by mouth every evening.     Budeson-Glycopyrrol-Formoterol (BREZTRI AEROSPHERE) 160-9-4.8 MCG/ACT AERO Inhale 2 puffs into the lungs in the morning and at bedtime. 5.9 g 0   Budeson-Glycopyrrol-Formoterol (BREZTRI AEROSPHERE) 160-9-4.8 MCG/ACT AERO Inhale 2 puffs into the lungs in the morning and at bedtime. 4.8 g 5   Continuous Blood Gluc Receiver (FREESTYLE LIBRE READER) DEVI 1 Device by Other  route 3 (three) times daily. E11.65 1 each 0   Continuous Blood Gluc Sensor (FREESTYLE LIBRE 14 DAY SENSOR) MISC USE TO TEST BLOOD SUGAR, CHANGING EVERY 14 DAYS  12   Dulaglutide 1.5 MG/0.5ML SOPN Inject 1.5 mg into the skin every Saturday.     finasteride (PROSCAR) 5 MG tablet Take 1 tablet (5 mg total) by mouth daily. 90 tablet 3   furosemide (LASIX) 20 MG tablet Take 20 mg by mouth daily.  3   glucose blood (ONETOUCH VERIO) test strip Use 1 strip via meter three times daily as directed.     HUMALOG KWIKPEN 100 UNIT/ML KiwkPen INJECT 12 UNITS SUBCUTANEOUSLY DAILY AT 7 AM.  12   Insulin Glargine (LANTUS SOLOSTAR) 100 UNIT/ML Solostar Pen Inject 22 Units into the skin at bedtime.     Insulin Pen Needle (B-D ULTRAFINE III SHORT PEN) 31G X 8 MM MISC Use 4 (four) times daily     lisinopril (ZESTRIL) 40 MG tablet Take by mouth.     metFORMIN (GLUCOPHAGE-XR) 500 MG 24 hr tablet TAKE 2 TABLETS (1,000 MG TOTAL) BY MOUTH DAILY WITH DINNER.  5   Multiple Vitamin (MULTI-VITAMINS) TABS Take by mouth.     No current facility-administered medications on file prior to visit.    Cardiovascular and other pertinent studies:  Echocardiogram 08/29/2021:  Left ventricle cavity is normal in size and wall thickness. Mild global  hypokinesis. LVEF 45-50%. Indeterminate diastolic filling pattern.  Structurally normal trileaflet aortic valve. No evidence of aortic  stenosis. Mild (Grade I) aortic regurgitation.  Mild (Grade I) mitral regurgitation.  Normal right atrial pressure.  EKG 06/26/2021: Sinus rhythm 86 bpm  Left ventricular hypertrophy IVCD Nonspecific T-abnormality   Recent labs: 05/24/2021: Glucose 167, BUN/Cr 12/0.88. EGFR >60. Na/K 138/36. Rest of the CMP normal H/H 13.5/4.3. MCV 83.4. Platelets 123 Trop HS 22, 20   10/2020: HbA1C 11.3%   Review of Systems  Cardiovascular:  Positive for leg swelling and syncope. Negative for chest pain, dyspnea on exertion and palpitations.         There were no vitals filed for this visit.   No data found.    There is no height or weight on file to calculate BMI. There were no vitals filed for this visit.    Objective:   Physical Exam Vitals and nursing note reviewed.  Constitutional:      General: He is not in acute distress. Neck:     Vascular: No JVD.  Cardiovascular:     Rate and Rhythm: Normal rate and regular rhythm.     Heart sounds: Normal heart sounds. No murmur heard. Pulmonary:     Effort: Pulmonary effort is normal.     Breath sounds: Normal breath sounds. No wheezing or rales.  Musculoskeletal:     Right lower leg: Edema (1+) present.     Left lower leg: Edema (1+) present.  Skin:    Comments: Discoloration and scarring bilateral legs medial aspect, stasis dermatitis         Assessment & Recommendations:   65 y.o. African American male with hypertension, hyperlipidemia, uncontrolled type 2 DM, referred for evaluation of syncope  Syncope: Most likely etiology is vasovagal syncope. Cardiac etiology less likely, but cannot be excluded. IN light of mild troponin elevation on presentation, ischemia is also in the differential. Recommend exercise nuclear stress test, echocardiogram, two week live cardiac telemetry, CT cardiac scoring.   Recommend wearing compression stockings, discussed counter pressure maneuvers, increased fluid intake. Compression stockings should also help what appears to be venous insufficiency.,   Further recommendations after above testing   Thank you for referring the patient to [place]. Please feel free to contact with any questions.   Nigel Mormon, MD Pager: [phone] Office: [phone]

## 2021-09-12 ENCOUNTER — Ambulatory Visit: Payer: Managed Care, Other (non HMO)

## 2021-09-12 ENCOUNTER — Other Ambulatory Visit: Payer: Self-pay

## 2021-09-14 LAB — PCV MYOCARDIAL PERFUSION WO LEXISCAN
Angina Index: 0
ST Depression (mm): 0 mm

## 2021-09-18 NOTE — Progress Notes (Signed)
Called and spoke with patient regarding his stress test results. Patient will be here at his scheduled appointment.

## 2021-09-20 NOTE — Progress Notes (Signed)
06/19/21- 72 yoM, Veteran,  former smoker (minimal) for sleep evaluation courtesy of Dr Vista Lawman with concern of OSA and ongoing cough. 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 Medical problem list includes HTN, DM2, Obesity, BPH, Cough, Sarcoid, Depression,  Epworth score-5 Bodyweight today- Covid vax-4 Phizer -----Sleep study completed 10+ years ago. Not currently on C-Pap He is most concerned about annoying dry cough x 2 months. Had CXR for this at [facility]) Urgent Care about 6 weeks ago. Report requested- not in CE. Treated then with Zith, prednisone, benzonatate- little effect.Albuterol inhaler little help.  Note chronic Lisinopril. Not very short of breath.  CXR July [facility]- not in CE Sleep- Aware of loud snoring. Falls asleep immediately. No sleep meds. Much stress- son was killed, widow then suicide in [place]. Pt is having to resolve estate and care for small children.   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 Productive with yellow sputum. Clearing throat often. States this has been going on for at least 6 months Discussed OSA. He will start CPAP- tried before over 20 yrs ago. Discussed cough. Imaging hasn't revealed cause. Discussed lisinopril and his mild reflux. We will change him to an ARB for trial, but I emphasized his PCP will manage his BP.  CT cardiac scoring 08/28/21- The heart size is within normal limits. No pericardial fluid is identified. Visualized segments of the thoracic aorta and central pulmonary arteries are normal in caliber. Visualized mediastinum and hilar regions demonstrate no lymphadenopathy or  masses. Visualized lungs show no evidence of pulmonary edema, consolidation, pneumothorax, nodule or pleural fluid. Visualized upper abdomen and bony structures are unremarkable. IMPRESSION: Coronary calcium score of 0.  ROS-see HPI  + = positive Constitutional:    weight loss, night sweats, fevers, chills, fatigue, lassitude. HEENT:    headaches, difficulty swallowing, tooth/dental problems, sore throat,       sneezing, itching, ear ache, nasal congestion, post nasal drip, snoring CV:    chest pain, orthopnea, PND, swelling in lower extremities, anasarca,                                   dizziness, palpitations Resp:   shortness of breath with exertion or at rest.                +productive cough,   +non-productive cough, coughing up of blood.              change in color of mucus.  wheezing.   Skin:    rash or lesions. GI:  +heartburn, indigestion, abdominal pain, nausea, vomiting, diarrhea,                 change in bowel habits, loss of appetite GU: dysuria, change in color of urine, no urgency or frequency.   flank pain. MS:   joint pain, stiffness, decreased range of motion, back pain. Neuro-     nothing unusual Psych:  change in mood or affect.  depression or anxiety.   memory loss.   OBJ- Physical Exam General- Alert, Oriented, Affect-appropriate, Distress- none acute, + obese Skin- rash-none, lesions-  none, excoriation- none Lymphadenopathy- none Head- atraumatic            Eyes- Gross vision intact, PERRLA, conjunctivae and secretions clear            Ears- Hearing, canals-normal            Nose- Clear, no-Septal dev, mucus, polyps, erosion, perforation             Throat- Mallampati III , mucosa clear , drainage- none, tonsils- atrophic, = teeth Neck- flexible , trachea midline, no stridor , thyroid nl, carotid no bruit Chest - symmetrical excursion , unlabored           Heart/CV- RRR , no murmur , no gallop  , no rub, nl s1 s2                           - JVD- none ,  edema- none, stasis changes- none, varices- none           Lung- clear to P&A, wheeze- none, cough+dry/ paroxysmal , dullness-none, rub- none           Chest wall-  Abd-  Br/ Gen/ Rectal- Not done, not indicated Extrem- cyanosis- none, clubbing, none, atrophy- none, strength- nl Neuro- grossly intact to observation

## 2021-09-21 ENCOUNTER — Other Ambulatory Visit: Payer: Self-pay

## 2021-09-21 ENCOUNTER — Encounter: Payer: Self-pay | Admitting: Internal Medicine

## 2021-09-21 ENCOUNTER — Ambulatory Visit (INDEPENDENT_AMBULATORY_CARE_PROVIDER_SITE_OTHER): Payer: Managed Care, Other (non HMO) | Admitting: Internal Medicine

## 2021-09-21 VITALS — BP 136/80 | HR 102 | Temp 98.8°F | Ht 70.0 in | Wt 292.0 lb

## 2021-09-21 DIAGNOSIS — G4733 Obstructive sleep apnea (adult) (pediatric): Secondary | ICD-10-CM

## 2021-09-21 DIAGNOSIS — R053 Chronic cough: Secondary | ICD-10-CM

## 2021-09-21 MED ORDER — BENZONATATE 200 MG PO CAPS: 200.0000 mg | ORAL_CAPSULE | Freq: Three times a day (TID) | ORAL | 1 refills | Status: DC | PRN

## 2021-09-21 MED ORDER — LOSARTAN POTASSIUM-HCTZ 50-12.5 MG PO TABS: 1.0000 | ORAL_TABLET | Freq: Every day | ORAL | 5 refills | Status: DC

## 2021-09-21 MED ORDER — OMEPRAZOLE 20 MG PO CPDR: 20.0000 mg | DELAYED_RELEASE_CAPSULE | Freq: Every day | ORAL | 5 refills | Status: DC

## 2021-09-21 NOTE — Patient Instructions (Addendum)
Order- new DME,new CPAP auto 5-20, mask of choice, humidifier, supplies, AirView/ card  Scripts sent for  Hyzaar (losartan/HCTZ)   1 daily for BP, instead of lisinopril  Omeprazole- acid blocker - 1 daily before either breakfast or supper meal  Benzonatate perles - 1 cap 3 times daily as needed for cough  Ok to also use an otc cough syrup for cough as needed  Talk with your primary doctor about BP management. I am trying to get you off of ACE inhibitors, which may cause cough.

## 2021-09-21 NOTE — Assessment & Plan Note (Signed)
We will explore impact of ACE inhibitor and mild reflux Plan- try changing lisinopril to Hyzaar (ins preferred). Try omeprazole. Add benzonatate.

## 2021-09-21 NOTE — Assessment & Plan Note (Signed)
Options reviewed and CPAP chosen. Plan- start CPAP auto 5-20

## 2021-09-24 ENCOUNTER — Ambulatory Visit: Payer: Managed Care, Other (non HMO) | Admitting: Cardiology

## 2021-09-24 ENCOUNTER — Inpatient Hospital Stay: Payer: Managed Care, Other (non HMO)

## 2021-09-24 ENCOUNTER — Encounter: Payer: Self-pay | Admitting: Cardiology

## 2021-09-24 ENCOUNTER — Other Ambulatory Visit: Payer: Self-pay

## 2021-09-24 VITALS — BP 178/104 | HR 96 | Temp 98.0°F | Resp 16 | Ht 70.0 in | Wt 298.0 lb

## 2021-09-24 DIAGNOSIS — I209 Angina pectoris, unspecified: Secondary | ICD-10-CM

## 2021-09-24 DIAGNOSIS — R55 Syncope and collapse: Secondary | ICD-10-CM

## 2021-09-24 DIAGNOSIS — I10 Essential (primary) hypertension: Secondary | ICD-10-CM

## 2021-09-24 MED ORDER — METOPROLOL SUCCINATE ER 50 MG PO TB24
50.0000 mg | ORAL_TABLET | Freq: Every day | ORAL | 3 refills | Status: DC
Start: 2021-09-24 — End: 2022-07-25

## 2021-09-24 MED ORDER — ISOSORBIDE MONONITRATE ER 30 MG PO TB24: 30.0000 mg | ORAL_TABLET | Freq: Every day | ORAL | 3 refills | Status: DC

## 2021-09-24 NOTE — Progress Notes (Signed)
Patient referred by [name], MD for syncoope  Subjective:   Johnny Maddox, male    DOB: [birth year], 65 y.o.   MRN: [id number]   Chief Complaint  Patient presents with   Loss of Consciousness   Follow-up    4 week     HPI  65 y.o. African American male with hypertension, hyperlipidemia, uncontrolled type 2 DM, referred for evaluation of syncope  Patient is here for follow-up visit.  Discussed recent test results with the patient, details below.  Patient is tearful today, has been under a lot of stress after several deaths in the family.  He is currently seeing a therapist to help with grief counseling.  He is hoping to take some time off work and recover mentally and physically.  From cardiac standpoint, he does report at least 1 more episode of syncope during exertion while climbing up stairs.  He routinely has episodes of chest pain with exertion, improved with rest.  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 He has hypertension, type 2 DM, hyperlipidemia for at least 5 years. He has had a lot of stressors, both at home and work. Four weeks ago, patient has an episode of loss of consciousness while at work. Patient woke up that morning at 6 AM, as usual. He did not have any time to eat proper breakfast due to time constraints. He was at work, remembers getting up from his chair to go get a file. Next thing he remembers that he had a lot of people standing around him. He was confused on regaining consciousness, denies losing bowel or bladder tone. Patient was taken to [facility] regional ED. Trop HS was mildly elevated and flat at 22,20. No other significant abnormalities were noted.   He has not had any more episodes since then. Prior to this event, he has had dizziness, but never had syncope. At baseline, patient denies any chest pain, shortness of breath symptoms. He has leg edema, which improved with compression stockings.    Current  Outpatient Medications on File Prior to Visit  Medication Sig Dispense Refill   aspirin EC 81 MG tablet Take 81 mg by mouth every evening.      atorvastatin (LIPITOR) 40 MG tablet Take 40 mg by mouth every evening.     benzonatate (TESSALON) 200 MG capsule Take 1 capsule (200 mg total) by mouth 3 (three) times daily as needed for cough. 30 capsule 1   Budeson-Glycopyrrol-Formoterol (BREZTRI AEROSPHERE) 160-9-4.8 MCG/ACT AERO Inhale 2 puffs into the lungs in the morning and at bedtime. 4.8 g 5   Continuous Blood Gluc Receiver (FREESTYLE LIBRE READER) DEVI 1 Device by Other route 3 (three) times daily. E11.65 1 each 0   Continuous Blood Gluc Sensor (FREESTYLE LIBRE 14 DAY SENSOR) MISC USE TO TEST BLOOD SUGAR, CHANGING EVERY 14 DAYS  12   Dulaglutide 1.5 MG/0.5ML SOPN Inject 1.5 mg into the skin every Saturday.     finasteride (PROSCAR) 5 MG tablet Take 1 tablet (5 mg total) by mouth daily. 90 tablet 3   furosemide (LASIX) 20 MG tablet Take 20 mg by mouth daily.  3   glucose blood (ONETOUCH VERIO) test strip Use 1 strip via meter three times daily as directed.     HUMALOG KWIKPEN 100 UNIT/ML KiwkPen INJECT 12 UNITS SUBCUTANEOUSLY DAILY AT 7 AM.  12   Insulin Glargine (LANTUS SOLOSTAR) 100 UNIT/ML Solostar Pen Inject 22 Units into the skin at bedtime.  Insulin Pen Needle (B-D ULTRAFINE III SHORT PEN) 31G X 8 MM MISC Use 4 (four) times daily     losartan-hydrochlorothiazide (HYZAAR) 50-12.5 MG tablet Take 1 tablet by mouth daily. 30 tablet 5   metFORMIN (GLUCOPHAGE-XR) 500 MG 24 hr tablet TAKE 2 TABLETS (1,000 MG TOTAL) BY MOUTH DAILY WITH DINNER.  5   Multiple Vitamin (MULTI-VITAMINS) TABS Take by mouth.     omeprazole (PRILOSEC) 20 MG capsule Take 1 capsule (20 mg total) by mouth daily. 30 capsule 5   No current facility-administered medications on file prior to visit.    Cardiovascular and other pertinent studies:  Exercise Tetrofosmin stress test 09/14/2021: Exercise nuclear stress test  was performed using Bruce protocol. Patient reached 4.6 METS, and 90% of age predicted maximum heart rate. Exercise capacity was low. No chest pain reported, dyspnea reported. Heart rate and hemodynamic response were normal. Stress EKG revealed no ischemic changes. SPECT images show medium sized, medium intensity, partially reversible perfusion defect in apical inferior, apical to basal inferoseptal myocardium, along with global decrease in myocardial thickening and wall motion. Stress LVEF 30%.  High risk study.   Echocardiogram 08/29/2021:  Left ventricle cavity is normal in size and wall thickness. Mild global  hypokinesis. LVEF 45-50%. Indeterminate diastolic filling pattern.  Structurally normal trileaflet aortic valve. No evidence of aortic  stenosis. Mild (Grade I) aortic regurgitation.  Mild (Grade I) mitral regurgitation.  Normal right atrial pressure.  EKG 06/26/2021: Sinus rhythm 86 bpm  Left ventricular hypertrophy IVCD Nonspecific T-abnormality   Recent labs: 05/24/2021: Glucose 167, BUN/Cr 12/0.88. EGFR >60. Na/K 138/36. Rest of the CMP normal H/H 13.5/4.3. MCV 83.4. Platelets 123 Trop HS 22, 20   10/2020: HbA1C 11.3%   Review of Systems  Cardiovascular:  Positive for chest pain, leg swelling and syncope. Negative for dyspnea on exertion and palpitations.        Vitals:   09/24/21 0848 09/24/21 0849  BP: (!) 163/110 (!) 178/104  Pulse: 97 96  Resp: 16   Temp: 98 F (36.7 C)   SpO2: 97%    Body mass index is 42.76 kg/m. Filed Weights   09/24/21 0848  Weight: 298 lb (135.2 kg)     Objective:   Physical Exam Vitals and nursing note reviewed.  Constitutional:      General: He is not in acute distress. Neck:     Vascular: No JVD.  Cardiovascular:     Rate and Rhythm: Normal rate and regular rhythm.     Heart sounds: Normal heart sounds. No murmur heard. Pulmonary:     Effort: Pulmonary effort is normal.     Breath sounds: Normal breath sounds.  No wheezing or rales.  Abdominal:     Palpations: There is no mass.  Musculoskeletal:     Right lower leg: Edema (1+) present.     Left lower leg: Edema (1+) present.  Skin:    Comments: Discoloration and scarring bilateral legs medial aspect, stasis dermatitis         Assessment & Recommendations:   65 y.o. African American male with hypertension, hyperlipidemia, uncontrolled type 2 DM, referred for evaluation of syncope  Syncope: Placed on light cardiac telemetry today. Stress test did not show high risk findings, as above.  Keeping in mind his angina symptoms, ischemia, especially if left main or proximal LAD, could be a potential cause for syncope. Recommend metoprolol succinate 25 mg daily, Imdur 30 mg daily. Continue aspirin and statin. Recommend coronary angiography and possible intervention.  Given upcoming cardiac work-up scheduled, consider time off from work.  I encouraged him to talk to his therapist in order to assist with short-term disability while to manage his grief/depression.  F/u after cardiac catheterization.   Nigel Mormon, MD Pager: [phone] Office: [phone]

## 2021-09-26 ENCOUNTER — Other Ambulatory Visit: Payer: Self-pay

## 2021-09-28 LAB — CBC
Hematocrit: 44.1 % (ref 37.5–51.0)
Hemoglobin: 14.8 g/dL (ref 13.0–17.7)
MCH: 27.6 pg (ref 26.6–33.0)
MCHC: 33.6 g/dL (ref 31.5–35.7)
MCV: 82 fL (ref 79–97)
Platelets: 139 10*3/uL — ABNORMAL LOW (ref 150–450)
RBC: 5.37 x10E6/uL (ref 4.14–5.80)
RDW: 13.2 % (ref 11.6–15.4)
WBC: 5.9 10*3/uL (ref 3.4–10.8)

## 2021-09-28 LAB — BASIC METABOLIC PANEL
BUN/Creatinine Ratio: 14 (ref 10–24)
BUN: 15 mg/dL (ref 8–27)
CO2: 20 mmol/L (ref 20–29)
Calcium: 9.8 mg/dL (ref 8.6–10.2)
Chloride: 103 mmol/L (ref 96–106)
Creatinine, Ser: 1.05 mg/dL (ref 0.76–1.27)
Glucose: 208 mg/dL — ABNORMAL HIGH (ref 70–99)
Potassium: 4.5 mmol/L (ref 3.5–5.2)
Sodium: 142 mmol/L (ref 134–144)
eGFR: 79 mL/min/{1.73_m2} (ref >59)

## 2021-10-01 DIAGNOSIS — R9439 Abnormal result of other cardiovascular function study: Secondary | ICD-10-CM

## 2021-10-01 DIAGNOSIS — I2510 Atherosclerotic heart disease of native coronary artery without angina pectoris: Secondary | ICD-10-CM | POA: Diagnosis present

## 2021-10-02 ENCOUNTER — Other Ambulatory Visit: Payer: Self-pay

## 2021-10-02 ENCOUNTER — Encounter (HOSPITAL_COMMUNITY): Payer: Self-pay | Admitting: Cardiology

## 2021-10-02 ENCOUNTER — Ambulatory Visit (HOSPITAL_COMMUNITY)
Admission: RE | Admit: 2021-10-02 | Discharge: 2021-10-02 | Disposition: A | Discharge status: Home / Self Care | Payer: Managed Care, Other (non HMO) | Attending: Cardiology | Admitting: Cardiology

## 2021-10-02 ENCOUNTER — Encounter (HOSPITAL_COMMUNITY)
Admission: RE | Disposition: A | Discharge status: Home / Self Care | Payer: Self-pay | Source: Home / Self Care | Attending: Cardiology

## 2021-10-02 DIAGNOSIS — E1165 Type 2 diabetes mellitus with hyperglycemia: Secondary | ICD-10-CM | POA: Diagnosis not present

## 2021-10-02 DIAGNOSIS — I25119 Atherosclerotic heart disease of native coronary artery with unspecified angina pectoris: Secondary | ICD-10-CM | POA: Diagnosis not present

## 2021-10-02 DIAGNOSIS — I2510 Atherosclerotic heart disease of native coronary artery without angina pectoris: Secondary | ICD-10-CM | POA: Diagnosis present

## 2021-10-02 DIAGNOSIS — I10 Essential (primary) hypertension: Secondary | ICD-10-CM | POA: Insufficient documentation

## 2021-10-02 DIAGNOSIS — E785 Hyperlipidemia, unspecified: Secondary | ICD-10-CM | POA: Diagnosis not present

## 2021-10-02 DIAGNOSIS — R55 Syncope and collapse: Secondary | ICD-10-CM | POA: Insufficient documentation

## 2021-10-02 DIAGNOSIS — R9439 Abnormal result of other cardiovascular function study: Secondary | ICD-10-CM

## 2021-10-02 HISTORY — PX: LEFT HEART CATH AND CORONARY ANGIOGRAPHY: CATH118249

## 2021-10-02 LAB — GLUCOSE, CAPILLARY: Glucose-Capillary: 189 mg/dL — ABNORMAL HIGH (ref 70–99)

## 2021-10-02 SURGERY — LEFT HEART CATH AND CORONARY ANGIOGRAPHY
Anesthesia: LOCAL

## 2021-10-02 MED ORDER — LIDOCAINE HCL (PF) 1 % IJ SOLN
INTRAMUSCULAR | Status: AC
  Filled 2021-10-02: qty 30

## 2021-10-02 MED ORDER — SODIUM CHLORIDE 0.9 % IV SOLN: 250.0000 mL | INTRAVENOUS | Status: DC | PRN

## 2021-10-02 MED ORDER — HEPARIN SODIUM (PORCINE) 1000 UNIT/ML IJ SOLN
INTRAMUSCULAR | Status: AC
  Filled 2021-10-02: qty 1

## 2021-10-02 MED ORDER — FENTANYL CITRATE (PF) 100 MCG/2ML IJ SOLN
INTRAMUSCULAR | Status: AC
  Filled 2021-10-02: qty 2

## 2021-10-02 MED ORDER — SODIUM CHLORIDE 0.9% FLUSH: 3.0000 mL | Freq: Two times a day (BID) | INTRAVENOUS | Status: DC

## 2021-10-02 MED ORDER — SODIUM CHLORIDE 0.9 % WEIGHT BASED INFUSION: 1.0000 mL/kg/h | INTRAVENOUS | Status: DC

## 2021-10-02 MED ORDER — SODIUM CHLORIDE 0.9 % WEIGHT BASED INFUSION
3.0000 mL/kg/h | INTRAVENOUS | Status: AC
  Administered 2021-10-02: 3 mL/kg/h via INTRAVENOUS

## 2021-10-02 MED ORDER — HEPARIN (PORCINE) IN NACL 1000-0.9 UT/500ML-% IV SOLN
INTRAVENOUS | Status: DC | PRN
  Administered 2021-10-02 (×2): 500 mL

## 2021-10-02 MED ORDER — SODIUM CHLORIDE 0.9 % IV SOLN: INTRAVENOUS | Status: DC

## 2021-10-02 MED ORDER — VERAPAMIL HCL 2.5 MG/ML IV SOLN
INTRAVENOUS | Status: DC | PRN
  Administered 2021-10-02: 10 mL via INTRA_ARTERIAL

## 2021-10-02 MED ORDER — MIDAZOLAM HCL 2 MG/2ML IJ SOLN
INTRAMUSCULAR | Status: AC
  Filled 2021-10-02: qty 2

## 2021-10-02 MED ORDER — MIDAZOLAM HCL 2 MG/2ML IJ SOLN
INTRAMUSCULAR | Status: DC | PRN
  Administered 2021-10-02: 2 mg via INTRAVENOUS

## 2021-10-02 MED ORDER — LIDOCAINE HCL (PF) 1 % IJ SOLN
INTRAMUSCULAR | Status: DC | PRN
  Administered 2021-10-02: 2 mL via INTRADERMAL

## 2021-10-02 MED ORDER — IOHEXOL 350 MG/ML SOLN
INTRAVENOUS | Status: DC | PRN
  Administered 2021-10-02: 60 mL

## 2021-10-02 MED ORDER — LABETALOL HCL 5 MG/ML IV SOLN: 10.0000 mg | INTRAVENOUS | Status: DC | PRN

## 2021-10-02 MED ORDER — HEPARIN SODIUM (PORCINE) 1000 UNIT/ML IJ SOLN
INTRAMUSCULAR | Status: DC | PRN
  Administered 2021-10-02: 5000 [IU] via INTRAVENOUS

## 2021-10-02 MED ORDER — HYDRALAZINE HCL 20 MG/ML IJ SOLN: 10.0000 mg | INTRAMUSCULAR | Status: DC | PRN

## 2021-10-02 MED ORDER — ACETAMINOPHEN 325 MG PO TABS: 650.0000 mg | ORAL_TABLET | ORAL | Status: DC | PRN

## 2021-10-02 MED ORDER — FENTANYL CITRATE (PF) 100 MCG/2ML IJ SOLN
INTRAMUSCULAR | Status: DC | PRN
  Administered 2021-10-02: 50 ug via INTRAVENOUS

## 2021-10-02 MED ORDER — ASPIRIN 81 MG PO CHEW: 81.0000 mg | CHEWABLE_TABLET | ORAL | Status: DC

## 2021-10-02 MED ORDER — ASPIRIN 81 MG PO CHEW
81.0000 mg | CHEWABLE_TABLET | ORAL | Status: AC
  Administered 2021-10-02: 81 mg via ORAL
  Filled 2021-10-02: qty 1

## 2021-10-02 MED ORDER — SODIUM CHLORIDE 0.9% FLUSH: 3.0000 mL | INTRAVENOUS | Status: DC | PRN

## 2021-10-02 MED ORDER — ONDANSETRON HCL 4 MG/2ML IJ SOLN: 4.0000 mg | Freq: Four times a day (QID) | INTRAMUSCULAR | Status: DC | PRN

## 2021-10-02 MED ORDER — HEPARIN (PORCINE) IN NACL 1000-0.9 UT/500ML-% IV SOLN
INTRAVENOUS | Status: AC
  Filled 2021-10-02: qty 1000

## 2021-10-02 SURGICAL SUPPLY — 12 items
CATH INFINITI JR4 5F (CATHETERS) ×2 IMPLANT
CATH LAUNCHER 5F EBU3.0 (CATHETERS) ×1 IMPLANT
CATH OPTITORQUE TIG 4.0 5F (CATHETERS) ×2 IMPLANT
CATHETER LAUNCHER 5F EBU3.0 (CATHETERS) ×2
DEVICE RAD COMP TR BAND LRG (VASCULAR PRODUCTS) ×2 IMPLANT
GLIDESHEATH SLEND A-KIT 6F 22G (SHEATH) ×2 IMPLANT
GUIDEWIRE INQWIRE 1.5J.035X260 (WIRE) ×1 IMPLANT
INQWIRE 1.5J .035X260CM (WIRE) ×2
KIT HEART LEFT (KITS) ×2 IMPLANT
PACK CARDIAC CATHETERIZATION (CUSTOM PROCEDURE TRAY) ×2 IMPLANT
TRANSDUCER W/STOPCOCK (MISCELLANEOUS) ×2 IMPLANT
TUBING CIL FLEX 10 FLL-RA (TUBING) ×2 IMPLANT

## 2021-10-02 NOTE — Interval H&P Note (Signed)
History and Physical Interval Note:  10/02/2021 10:07 AM  Johnny Maddox  has presented today for surgery, with the diagnosis of syncope.  The various methods of treatment have been discussed with the patient and family. After consideration of risks, benefits and other options for treatment, the patient has consented to  Procedure(s): LEFT HEART CATH AND CORONARY ANGIOGRAPHY (N/A) as a surgical intervention.  The patient's history has been reviewed, patient examined, no change in status, stable for surgery.  I have reviewed the patient's chart and labs.  Questions were answered to the patient's satisfaction.    2016/2017 Appropriate Use Criteria for Coronary Revascularization Symptom Status: Ischemic Symptoms  Non-invasive Testing: Intermediate Risk  If no or indeterminate stress test, FFR/iFR results in all diseased vessels: N/A  Diabetes Mellitus: Yes  S/P CABG: No  Antianginal therapy (number of long-acting drugs): >=2  Patient undergoing renal transplant: No  Patient undergoing percutaneous valve procedure: No  1 Vessel Disease PCI CABG  No proximal LAD involvement, No proximal left dominant LCX involvement A (8); Indication 2 M (6); Indication 2  Proximal left dominant LCX involvement A (8); Indication 5 A (8); Indication 5  Proximal LAD involvement A (8); Indication 5 A (8); Indication 5  2 Vessel Disease  No proximal LAD involvement A (8); Indication 8 A (7); Indication 8  Proximal LAD involvement A (8); Indication 14 A (9); Indication 14  3 Vessel Disease  Low disease complexity (e.g., focal stenoses, SYNTAX <=22) A (7); Indication 19 A (9); Indication 19  Intermediate or high disease complexity (e.g., SYNTAX >=23) M (6); Indication 23 A (9); Indication 23  Left Main Disease  Isolated LMCA disease: ostial or midshaft A (7); Indication 24 A (9); Indication 24  Isolated LMCA disease: bifurcation involvement M (6); Indication 25 A (9); Indication 25  LMCA ostial or midshaft,  concurrent low disease burden multivessel disease (e.g., 1-2 additional focal stenoses, SYNTAX <=22) A (7); Indication 26 A (9); Indication 26  LMCA ostial or midshaft, concurrent intermediate or high disease burden multivessel disease (e.g., 1-2 additional bifurcation stenoses, long stenoses, SYNTAX >=23) M (4); Indication 27 A (9); Indication 27  LMCA bifurcation involvement, concurrent low disease burden multivessel disease (e.g., 1-2 additional focal stenoses, SYNTAX <=22) M (6); Indication 28 A (9); Indication 28  LMCA bifurcation involvement, concurrent intermediate or high disease burden multivessel disease (e.g., 1-2 additional bifurcation stenoses, long stenoses, SYNTAX >=23) R (3); Indication 29 A (9); Indication [place]

## 2021-10-02 NOTE — H&P (Signed)
OV 09/24/2021 copied for documentation     Patient referred by Stallings, Zoe A, MD for syncoope  Subjective:   Johnny Maddox, male    DOB: 05/11/1956, 65 y.o.   MRN: [id number]   Chief Complaint  Patient presents with   Loss of Consciousness   Follow-up    4 week     HPI  65 y.o. African American male with hypertension, hyperlipidemia, uncontrolled type 2 DM, referred for evaluation of syncope  Patient is here for follow-up visit.  Discussed recent test results with the patient, details below.  Patient is tearful today, has been under a lot of stress after several deaths in the family.  He is currently seeing a therapist to help with grief counseling.  He is hoping to take some time off work and recover mentally and physically.  From cardiac standpoint, he does report at least 1 more episode of syncope during exertion while climbing up stairs.  He routinely has episodes of chest pain with exertion, improved with rest.  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 He has hypertension, type 2 DM, hyperlipidemia for at least 5 years. He has had a lot of stressors, both at home and work. Four weeks ago, patient has an episode of loss of consciousness while at work. Patient woke up that morning at 6 AM, as usual. He did not have any time to eat proper breakfast due to time constraints. He was at work, remembers getting up from his chair to go get a file. Next thing he remembers that he had a lot of people standing around him. He was confused on regaining consciousness, denies losing bowel or bladder tone. Patient was taken to [facility] regional ED. Trop HS was mildly elevated and flat at 22,20. No other significant abnormalities were noted.   He has not had any more episodes since then. Prior to this event, he has had dizziness, but never had syncope. At baseline, patient denies any chest pain, shortness of breath symptoms. He has leg edema, which improved with  compression stockings.    Current Outpatient Medications on File Prior to Visit  Medication Sig Dispense Refill   aspirin EC 81 MG tablet Take 81 mg by mouth every evening.      atorvastatin (LIPITOR) 40 MG tablet Take 40 mg by mouth every evening.     benzonatate (TESSALON) 200 MG capsule Take 1 capsule (200 mg total) by mouth 3 (three) times daily as needed for cough. 30 capsule 1   Budeson-Glycopyrrol-Formoterol (BREZTRI AEROSPHERE) 160-9-4.8 MCG/ACT AERO Inhale 2 puffs into the lungs in the morning and at bedtime. 4.8 g 5   Continuous Blood Gluc Receiver (FREESTYLE LIBRE READER) DEVI 1 Device by Other route 3 (three) times daily. E11.65 1 each 0   Continuous Blood Gluc Sensor (FREESTYLE LIBRE 14 DAY SENSOR) MISC USE TO TEST BLOOD SUGAR, CHANGING EVERY 14 DAYS  12   Dulaglutide 1.5 MG/0.5ML SOPN Inject 1.5 mg into the skin every Saturday.     finasteride (PROSCAR) 5 MG tablet Take 1 tablet (5 mg total) by mouth daily. 90 tablet 3   furosemide (LASIX) 20 MG tablet Take 20 mg by mouth daily.  3   glucose blood (ONETOUCH VERIO) test strip Use 1 strip via meter three times daily as directed.     HUMALOG KWIKPEN 100 UNIT/ML KiwkPen INJECT 12 UNITS SUBCUTANEOUSLY DAILY AT 7 AM.  12   Insulin Glargine (LANTUS SOLOSTAR) 100 UNIT/ML Solostar Pen Inject 22   Units into the skin at bedtime.     Insulin Pen Needle (B-D ULTRAFINE III SHORT PEN) 31G X 8 MM MISC Use 4 (four) times daily     losartan-hydrochlorothiazide (HYZAAR) 50-12.5 MG tablet Take 1 tablet by mouth daily. 30 tablet 5   metFORMIN (GLUCOPHAGE-XR) 500 MG 24 hr tablet TAKE 2 TABLETS (1,000 MG TOTAL) BY MOUTH DAILY WITH DINNER.  5   Multiple Vitamin (MULTI-VITAMINS) TABS Take by mouth.     omeprazole (PRILOSEC) 20 MG capsule Take 1 capsule (20 mg total) by mouth daily. 30 capsule 5   No current facility-administered medications on file prior to visit.    Cardiovascular and other pertinent studies:  Exercise Tetrofosmin stress test  09/14/2021: Exercise nuclear stress test was performed using Bruce protocol. Patient reached 4.6 METS, and 90% of age predicted maximum heart rate. Exercise capacity was low. No chest pain reported, dyspnea reported. Heart rate and hemodynamic response were normal. Stress EKG revealed no ischemic changes. SPECT images show medium sized, medium intensity, partially reversible perfusion defect in apical inferior, apical to basal inferoseptal myocardium, along with global decrease in myocardial thickening and wall motion. Stress LVEF 30%.  High risk study.   Echocardiogram 08/29/2021:  Left ventricle cavity is normal in size and wall thickness. Mild global  hypokinesis. LVEF 45-50%. Indeterminate diastolic filling pattern.  Structurally normal trileaflet aortic valve. No evidence of aortic  stenosis. Mild (Grade I) aortic regurgitation.  Mild (Grade I) mitral regurgitation.  Normal right atrial pressure.  EKG 06/26/2021: Sinus rhythm 86 bpm  Left ventricular hypertrophy IVCD Nonspecific T-abnormality   Recent labs: 05/24/2021: Glucose 167, BUN/Cr 12/0.88. EGFR >60. Na/K 138/36. Rest of the CMP normal H/H 13.5/4.3. MCV 83.4. Platelets 123 Trop HS 22, 20   10/2020: HbA1C 11.3%   Review of Systems  Cardiovascular:  Positive for chest pain, leg swelling and syncope. Negative for dyspnea on exertion and palpitations.        Vitals:   09/24/21 0848 09/24/21 0849  BP: (!) 163/110 (!) 178/104  Pulse: 97 96  Resp: 16   Temp: 98 F (36.7 C)   SpO2: 97%    Body mass index is 42.76 kg/m. Filed Weights   09/24/21 0848  Weight: 298 lb (135.2 kg)     Objective:   Physical Exam Vitals and nursing note reviewed.  Constitutional:      General: He is not in acute distress. Neck:     Vascular: No JVD.  Cardiovascular:     Rate and Rhythm: Normal rate and regular rhythm.     Heart sounds: Normal heart sounds. No murmur heard. Pulmonary:     Effort: Pulmonary effort is normal.      Breath sounds: Normal breath sounds. No wheezing or rales.  Abdominal:     Palpations: There is no mass.  Musculoskeletal:     Right lower leg: Edema (1+) present.     Left lower leg: Edema (1+) present.  Skin:    Comments: Discoloration and scarring bilateral legs medial aspect, stasis dermatitis         Assessment & Recommendations:   65 y.o. African American male with hypertension, hyperlipidemia, uncontrolled type 2 DM, referred for evaluation of syncope  Syncope: Placed on light cardiac telemetry today. Stress test did not show high risk findings, as above.  Keeping in mind his angina symptoms, ischemia, especially if left main or proximal LAD, could be a potential cause for syncope. Recommend metoprolol succinate 25 mg daily, Imdur 30 mg daily.   Continue aspirin and statin. Recommend coronary angiography and possible intervention.  Given upcoming cardiac work-up scheduled, consider time off from work.  I encouraged him to talk to his therapist in order to assist with short-term disability while to manage his grief/depression.  F/u after cardiac catheterization.   Manish J Patwardhan, MD Pager: [phone] Office: [phone]  

## 2021-10-12 ENCOUNTER — Inpatient Hospital Stay: Payer: Managed Care, Other (non HMO)

## 2021-10-15 ENCOUNTER — Inpatient Hospital Stay: Payer: Managed Care, Other (non HMO)

## 2021-10-22 ENCOUNTER — Inpatient Hospital Stay: Payer: Managed Care, Other (non HMO)

## 2021-10-23 ENCOUNTER — Telehealth: Payer: Self-pay | Admitting: Urology

## 2021-10-23 NOTE — Telephone Encounter (Signed)
Please have Johnny Maddox schedule an appointment for his history of elevated PSA as he is overdue for his 30-month visit.

## 2021-10-31 DIAGNOSIS — E1165 Type 2 diabetes mellitus with hyperglycemia: Secondary | ICD-10-CM | POA: Insufficient documentation

## 2021-10-31 DIAGNOSIS — Z794 Long term (current) use of insulin: Secondary | ICD-10-CM | POA: Insufficient documentation

## 2021-10-31 DIAGNOSIS — E785 Hyperlipidemia, unspecified: Secondary | ICD-10-CM | POA: Insufficient documentation

## 2021-10-31 NOTE — Progress Notes (Signed)
03/02/2021 3:02 PM   Johnny Maddox 10-Jul-1956 [id number]  Referring provider: Velna Hatchet, [place],  [place] [address]  Chief Complaint  Patient presents with   Benign Prostatic Hypertrophy   Urological history: 1. Elevated PSA -PSA Trend Component     Latest Ref Rng & Units 04/03/2016 04/10/2016 10/11/2016 02/04/2017  Prostate Specific Ag, Serum     0.0 - 4.0 ng/mL 4.4 (H) 4.0 6.2 (H) 3.7   Component     Latest Ref Rng & Units 05/23/2017 12/22/2017  Prostate Specific Ag, Serum     0.0 - 4.0 ng/mL 3.4 3.3   Component     Latest Ref Rng & Units 05/29/2018 09/09/2019 09/14/2019 10/29/2019  Prostate Specific Ag, Serum     0.0 - 4.0 ng/mL 4.7 (H) 6.5 (H) 5.3 (H) 3.9   Component     Latest Ref Rng & Units 08/15/2020 11/22/2020 03/02/2021  Prostate Specific Ag, Serum     0.0 - 4.0 ng/mL 4.2 (H) 5.4 (H) 4.7 (H)   Today's PSA pending  Patient on finasteride, but he does not take it consistently.    2. ED -contributing factors of age, BPH, DM and HTN, HLD, CAD and sleep apnea -SHIM 12  -managed with tadalafil 20 mg, on-demand-dosing  3. BPH with LUTS -PSA pending -I PSS 9/5 -managed with finasteride 5 mg daily  4. Left testicular mass -scrotal ultrasound 08/2020 Stable 4 mm nodule in the left testicle. This is favored to be benign given its 3 year stability  HPI: Johnny Maddox is a 65 y.o. male who presents today for follow up.   Patient is not having spontaneous erections.  He denies any pain or curvature with erections.    SHIM     Row Name 11/01/21 1410         SHIM: Over the last 6 months:   How do you rate your confidence that you could get and keep an erection? Low     When you had erections with sexual stimulation, how often were your erections hard enough for penetration (entering your partner)? Most Times (much more than half the time)     During sexual intercourse, how often were you able to maintain your erection after you had penetrated  (entered) your partner? A Few Times (much less than half the time)     During sexual intercourse, how difficult was it to maintain your erection to completion of intercourse? Very Difficult     When you attempted sexual intercourse, how often was it satisfactory for you? A Few Times (much less than half the time)       SHIM Total Score   SHIM 12               Score: 1-7 Severe ED 8-11 Moderate ED 12-16 Mild-Moderate ED 17-21 Mild ED 22-25 No ED   He continues to have urinary frequency secondary to diuretics.  Patient denies any modifying or aggravating factors.  Patient denies any gross hematuria, dysuria or suprapubic/flank pain.  Patient denies any fevers, chills, nausea or vomiting.     IPSS     Row Name 11/01/21 1400         International Prostate Symptom Score   How often have you had the sensation of not emptying your bladder? Not at All     How often have you had to urinate less than every two hours? More than half the time     How often have  you found you stopped and started again several times when you urinated? Less than 1 in 5 times     How often have you found it difficult to postpone urination? More than half the time     How often have you had a weak urinary stream? Not at All     How often have you had to strain to start urination? Not at All     How many times did you typically get up at night to urinate? None     Total IPSS Score 9       Quality of Life due to urinary symptoms   If you were to spend the rest of your life with your urinary condition just the way it is now how would you feel about that? Unhappy               Score:  1-7 Mild 8-19 Moderate 20-35 Severe   PMH: Past Medical History:  Diagnosis Date   Acute laryngitis, without mention of obstruction    Allergic rhinitis, cause unspecified    Chalazion    Cough    Dermatophytosis of foot    Diabetes mellitus without complication (HCC)    Elevated PSA    Hyperlipidemia     Hypertension    Microalbuminuria    Overweight    Seborrheic dermatitis, unspecified    Sleep apnea    Unspecified disorder of autonomic nervous system    Unspecified sleep apnea    Unspecified venous (peripheral) insufficiency    Unspecified vitamin D deficiency     Surgical History: Past Surgical History:  Procedure Laterality Date   CIRCUMCISION     HYDROCELE EXCISION Right 09/23/2016   Procedure: HYDROCELECTOMY ADULT ;  Surgeon: Hollice Espy, MD;  Location: [facility];  Service: Urology;  Laterality: Right;  INGUINAL APPROACH   LEFT HEART CATH AND CORONARY ANGIOGRAPHY N/A 10/02/2021   Procedure: LEFT HEART CATH AND CORONARY ANGIOGRAPHY;  Surgeon: Nigel Mormon, MD;  Location: [facility];  Service: Cardiovascular;  Laterality: N/A;   ROTATOR CUFF REPAIR  R 2011, L 2006   L and R shoulder   TONSILLECTOMY AND ADENOIDECTOMY      Home Medications:  Allergies as of 11/01/2021       Reactions   Other Anaphylaxis   Mushrooms   Lisinopril Cough        Medication List        Accurate as of November 01, 2021  3:02 PM. If you have any questions, ask your nurse or doctor.          atorvastatin 20 MG tablet Commonly known as: LIPITOR Take 20 mg by mouth every evening.   B-D ULTRAFINE III SHORT PEN 31G X 8 MM Misc Generic drug: Insulin Pen Needle Use 4 (four) times daily   benzonatate 200 MG capsule Commonly known as: TESSALON Take 1 capsule (200 mg total) by mouth 3 (three) times daily as needed for cough.   Breztri Aerosphere 160-9-4.8 MCG/ACT Aero Generic drug: Budeson-Glycopyrrol-Formoterol Inhale 2 puffs into the lungs in the morning and at bedtime.   finasteride 5 MG tablet Commonly known as: PROSCAR Take 1 tablet (5 mg total) by mouth daily.   FreeStyle Libre 14 Day Sensor Misc USE TO TEST BLOOD SUGAR, CHANGING EVERY 14 DAYS   FreeStyle Libre Reader Devi 1 Device by Other route 3 (three) times daily. E11.65   furosemide 20 MG  tablet Commonly known as: LASIX Take 20 mg by mouth  daily as needed for edema.   HumaLOG KwikPen 100 UNIT/ML KwikPen Generic drug: insulin lispro 20 Units 3 (three) times daily.   Lantus SoloStar 100 UNIT/ML Solostar Pen Generic drug: insulin glargine Inject 25 Units into the skin at bedtime.   losartan 50 MG tablet Commonly known as: COZAAR Take 50 mg by mouth daily.   metFORMIN 500 MG 24 hr tablet Commonly known as: GLUCOPHAGE-XR Take 1,000 mg by mouth in the morning and at bedtime.   metoprolol succinate 50 MG 24 hr tablet Commonly known as: TOPROL-XL Take 1 tablet (50 mg total) by mouth daily. Take with or immediately following a meal.   Multi-Vitamins Tabs Take 1 tablet by mouth daily.   omeprazole 20 MG capsule Commonly known as: PRILOSEC Take 1 capsule (20 mg total) by mouth daily.   OneTouch Verio test strip Generic drug: glucose blood Use 1 strip via meter three times daily as directed.   sildenafil 100 MG tablet Commonly known as: VIAGRA Take 1 tablet (100 mg total) by mouth daily as needed for erectile dysfunction. Take two hours prior to intercourse on an empty stomach Started by: Zara Council, PA-C   tadalafil 20 MG tablet Commonly known as: CIALIS Take 20 mg by mouth daily.   Trulicity 9.39 QZ/0.0PQ Sopn Generic drug: Dulaglutide Inject 0.75 mg into the skin every Saturday.        Allergies:  Allergies  Allergen Reactions   Other Anaphylaxis    Mushrooms   Lisinopril Cough    Family History: Family History  Problem Relation Age of Onset   Cancer Mother        leukemia   Hypertension Mother    Alcohol abuse Father    Hypertension Brother    Prostate cancer Neg Hx    Bladder Cancer Neg Hx    Kidney disease Neg Hx    Kidney cancer Neg Hx     Social History:  reports that he has quit smoking. He has never used smokeless tobacco. He reports current alcohol use of about 2.0 standard drinks per week. He reports that he does not use  drugs.  ROS: For pertinent review of systems please refer to history of present illness  Physical Exam: BP (!) 145/95    Pulse (!) 108    Ht 5\' 10"  (1.778 m)    Wt 285 lb (129.3 kg)    BMI 40.89 kg/m   Constitutional:  Well nourished. Alert and oriented, No acute distress. HEENT: [place] AT, mask in place.  Trachea midline Cardiovascular: No clubbing, cyanosis, or edema. Respiratory: Normal respiratory effort, no increased work of breathing. GU: No CVA tenderness.  No bladder fullness or masses.  Patient with uncircumcised phallus.  Foreskin easily retracted  Urethral meatus is patent.  No penile discharge. No penile lesions or rashes. Scrotum without lesions, cysts, rashes and/or edema.  Testicles are located scrotally bilaterally. No masses are appreciated in the testicles. Left and right epididymis are normal. Rectal: Patient with  normal sphincter tone. Anus and perineum without scarring or rashes. No rectal masses are appreciated. Prostate is approximately 50 + grams, could only palpate the apex, no nodules are appreciated. Seminal vesicles could not be palpated.  Neurologic: Grossly intact, no focal deficits, moving all 4 extremities. Psychiatric: Normal mood and affect.   Laboratory Data: See HPI  I have reviewed the labs.  Pertinent Imaging:  No results found for any visits on 11/01/21.    Assessment & Plan:    1. Testicular mass -stable  x serial scrotal ultrasounds -continue self testicular exams  2. Elevated PSA -PSA pending -If continues upward trend would recommend prostate MRI or prostate biopsy  3. BPH with LUTS -PSA pending -DRE benign -symptoms - frequency -continue conservative management, avoiding bladder irritants and timed voiding's -Continue finasteride 5 mg daily-refills given   4. Erectile dysfunction -would like to try a different PDE5i -sent in script for sildenafil 100 mg, on-demand-dosing  Return in about 6 months (around 05/02/2022) for IPSS,  SHIM, PSA and exam.      Zara Council, [facility]  [place] [address], [place], [place] [address] [phone]

## 2021-11-01 ENCOUNTER — Ambulatory Visit: Payer: Managed Care, Other (non HMO) | Admitting: Urology

## 2021-11-01 ENCOUNTER — Encounter: Payer: Self-pay | Admitting: Urology

## 2021-11-01 ENCOUNTER — Other Ambulatory Visit: Payer: Self-pay

## 2021-11-01 VITALS — BP 145/95 | HR 108 | Ht 70.0 in | Wt 285.0 lb

## 2021-11-01 DIAGNOSIS — R972 Elevated prostate specific antigen [PSA]: Secondary | ICD-10-CM

## 2021-11-01 DIAGNOSIS — N401 Enlarged prostate with lower urinary tract symptoms: Secondary | ICD-10-CM | POA: Diagnosis not present

## 2021-11-01 DIAGNOSIS — D4959 Neoplasm of unspecified behavior of other genitourinary organ: Secondary | ICD-10-CM | POA: Diagnosis not present

## 2021-11-01 DIAGNOSIS — N138 Other obstructive and reflux uropathy: Secondary | ICD-10-CM

## 2021-11-01 DIAGNOSIS — N529 Male erectile dysfunction, unspecified: Secondary | ICD-10-CM | POA: Diagnosis not present

## 2021-11-01 MED ORDER — SILDENAFIL CITRATE 100 MG PO TABS: 100.0000 mg | ORAL_TABLET | Freq: Every day | ORAL | 3 refills | Status: DC | PRN

## 2021-11-01 MED ORDER — FINASTERIDE 5 MG PO TABS: 5.0000 mg | ORAL_TABLET | Freq: Every day | ORAL | 3 refills | Status: DC

## 2021-11-02 LAB — PSA: Prostate Specific Ag, Serum: 7.7 ng/mL — ABNORMAL HIGH (ref 0.0–4.0)

## 2021-11-06 ENCOUNTER — Encounter: Payer: Self-pay | Admitting: *Deleted

## 2021-11-19 NOTE — Progress Notes (Signed)
° °  11/20/21  CC:  Chief Complaint  Patient presents with   Prostate Biopsy      HPI:  Johnny Maddox is a 66 y.o. male with a personal history of elevated PSA, ED, BPH with LUTS, and left testicular mass, who presents today for prostate biopsy.   His most recent PSA on 11/01/2021 was 7.7.   Rectal exam benign  Uses finasteride intermittently.   Vitals:   11/20/21 1029  BP: (!) 154/96  Pulse: (!) 101  NED. A&Ox3.   No respiratory distress   Abd soft, NT, ND Normal external genitalia with patent urethral meatus  Prostate Biopsy Procedure   Informed consent was obtained after discussing risks/benefits of the procedure.  A time out was performed to ensure correct patient identity.  Pre-Procedure: - Last PSA Level: Component     Latest Ref Rng & Units 11/01/2021  Prostate Specific Ag, Serum     0.0 - 4.0 ng/mL 7.7 (H)   - Gentamicin given prophylactically - Levaquin 500 mg administered PO -Transrectal Ultrasound performed revealing a 56 gm prostate -No significant hypoechoic or median lobe noted  Procedure: - Prostate block performed using 10 cc 1% lidocaine and biopsies taken from sextant areas, a total of 12 under ultrasound guidance.  Post-Procedure: - Patient tolerated the procedure well - He was counseled to seek immediate medical attention if experiences any severe pain, significant bleeding, or fevers - Return in one week to discuss biopsy results  I,Kailey Littlejohn,acting as a scribe for Hollice Espy, MD.,have documented all relevant documentation on the behalf of Hollice Espy, MD,as directed by  Hollice Espy, MD while in the presence of Hollice Espy, MD.  .[email or web address]

## 2021-11-20 ENCOUNTER — Other Ambulatory Visit: Payer: Self-pay

## 2021-11-20 ENCOUNTER — Ambulatory Visit: Payer: Managed Care, Other (non HMO) | Admitting: Urology

## 2021-11-20 VITALS — BP 154/96 | HR 101

## 2021-11-20 DIAGNOSIS — R972 Elevated prostate specific antigen [PSA]: Secondary | ICD-10-CM | POA: Diagnosis not present

## 2021-11-20 MED ORDER — LEVOFLOXACIN 500 MG PO TABS
500.0000 mg | ORAL_TABLET | Freq: Once | ORAL | Status: AC
  Administered 2021-11-20: 500 mg via ORAL

## 2021-11-20 MED ORDER — GENTAMICIN SULFATE 40 MG/ML IJ SOLN
80.0000 mg | Freq: Once | INTRAMUSCULAR | Status: AC
  Administered 2021-11-20: 80 mg via INTRAMUSCULAR

## 2021-11-22 LAB — SURGICAL PATHOLOGY

## 2021-11-26 NOTE — Progress Notes (Signed)
11/27/21 2:57 PM   [name] July 24, 1956 [id number]  Referring provider:  Velna Hatchet, [place],  [place] [address] Chief Complaint  Patient presents with   Results     HPI: Johnny Maddox is a 66 y.o.male with a personal history of elevated PSA, ED, BPH with LUTS, and left testicular mass, who presents today for prostate biopsy results.   His most recent PSA on 11/01/2021 was 7.7.    He underwent biopsy on 11/20/2021. Surgical pathology revealed Gleason 4+7 involving 2 cores affecting up to 75%, and Gleason 3+3 involving 4 cores affecting up to 50%. His TRUS volume was 56 cc.  Uses finasteride intermittently.  Him SHIM was 12 and his IPSS was 9/5.   He reports that his mother and brother passed away from cancer.  He is really upset today about this diagnosis.  He has 74 year old daughter.  Surgical Pathology:   [A] PROSTATE, LEFT BASE:   NEGATIVE FOR MALIGNANCY.   [B] PROSTATE, LEFT MID:   NEGATIVE FOR MALIGNANCY.   [C] PROSTATE, LEFT APEX:   ACINAR ADENOCARCINOMA, GLEASON 4+3=7  (GG  3), INVOLVING 1 CORES, MEASURING 9  MM ( 75%). % pattern 4 = 60%   [D] PROSTATE, RIGHT BASE:   NEGATIVE FOR MALIGNANCY.   [E] PROSTATE, RIGHT MID:   ACINAR ADENOCARCINOMA, GLEASON 3+3=6  (GG  1), INVOLVING 1 CORES, MEASURING 0.5  MM ( 3%).   [F] PROSTATE, RIGHT APEX:   NEGATIVE FOR MALIGNANCY.   [G] PROSTATE, LEFT LATERAL BASE:   ACINAR ADENOCARCINOMA, GLEASON 3+3=6  (GG 1), INVOLVING 1 CORES, MEASURING 2  MM ( 12%).   [H] PROSTATE, LEFT LATERAL MID:   ACINAR ADENOCARCINOMA, GLEASON 3+3=6  (GG 1), INVOLVING 1 CORES, MEASURING 1  MM ( 5%).   [I] PROSTATE, LEFT LATERAL APEX:   ACINAR ADENOCARCINOMA, GLEASON 4+3=7  (GG 3), INVOLVING 1 CORES, MEASURING 6  MM ( 75%). % pattern 4 = 60%   [J] PROSTATE, RIGHT LATERAL BASE:   NEGATIVE FOR MALIGNANCY.   [K] PROSTATE, RIGHT LATERAL MID:   NEGATIVE FOR MALIGNANCY.   [L] PROSTATE, RIGHT LATERAL APEX:   ACINAR ADENOCARCINOMA, GLEASON   3+3=6  (GG 1), INVOLVING 1 CORES, MEASURING 3  MM ( 50%).       PMH: Past Medical History:  Diagnosis Date   Acute laryngitis, without mention of obstruction    Allergic rhinitis, cause unspecified    Chalazion    Cough    Dermatophytosis of foot    Diabetes mellitus without complication (HCC)    Elevated PSA    Hyperlipidemia    Hypertension    Microalbuminuria    Overweight    Seborrheic dermatitis, unspecified    Sleep apnea    Unspecified disorder of autonomic nervous system    Unspecified sleep apnea    Unspecified venous (peripheral) insufficiency    Unspecified vitamin D deficiency     Surgical History: Past Surgical History:  Procedure Laterality Date   CIRCUMCISION     HYDROCELE EXCISION Right 09/23/2016   Procedure: HYDROCELECTOMY ADULT ;  Surgeon: Hollice Espy, MD;  Location: [facility];  Service: Urology;  Laterality: Right;  INGUINAL APPROACH   LEFT HEART CATH AND CORONARY ANGIOGRAPHY N/A 10/02/2021   Procedure: LEFT HEART CATH AND CORONARY ANGIOGRAPHY;  Surgeon: Nigel Mormon, MD;  Location: [facility];  Service: Cardiovascular;  Laterality: N/A;   ROTATOR CUFF REPAIR  R 2011, L 2006   L and R shoulder  TONSILLECTOMY AND ADENOIDECTOMY      Home Medications:  Allergies as of 11/27/2021       Reactions   Other Anaphylaxis   Mushrooms   Lisinopril Cough        Medication List        Accurate as of November 27, 2021  2:57 PM. If you have any questions, ask your nurse or doctor.          STOP taking these medications    benzonatate 200 MG capsule Commonly known as: TESSALON Stopped by: Hollice Espy, MD       TAKE these medications    atorvastatin 40 MG tablet Commonly known as: LIPITOR Take 40 mg by mouth daily. What changed: Another medication with the same name was removed. Continue taking this medication, and follow the directions you see here. Changed by: Hollice Espy, MD   B-D ULTRAFINE III SHORT PEN 31G  X 8 MM Misc Generic drug: Insulin Pen Needle Use 4 (four) times daily   Breztri Aerosphere 160-9-4.8 MCG/ACT Aero Generic drug: Budeson-Glycopyrrol-Formoterol Inhale 2 puffs into the lungs in the morning and at bedtime.   finasteride 5 MG tablet Commonly known as: PROSCAR Take 1 tablet (5 mg total) by mouth daily.   fluconazole 100 MG tablet Commonly known as: DIFLUCAN Take 100 mg by mouth daily.   FreeStyle Libre 14 Day Sensor Misc USE TO TEST BLOOD SUGAR, CHANGING EVERY 14 DAYS   FreeStyle Libre Reader Devi 1 Device by Other route 3 (three) times daily. E11.65   furosemide 20 MG tablet Commonly known as: LASIX Take 20 mg by mouth daily as needed for edema.   HumaLOG KwikPen 100 UNIT/ML KwikPen Generic drug: insulin lispro 20 Units 3 (three) times daily.   Lantus SoloStar 100 UNIT/ML Solostar Pen Generic drug: insulin glargine Inject 25 Units into the skin at bedtime.   losartan 50 MG tablet Commonly known as: COZAAR Take 50 mg by mouth daily.   metFORMIN 500 MG 24 hr tablet Commonly known as: GLUCOPHAGE-XR Take 1,000 mg by mouth in the morning and at bedtime.   metoprolol succinate 50 MG 24 hr tablet Commonly known as: TOPROL-XL Take 1 tablet (50 mg total) by mouth daily. Take with or immediately following a meal.   Multi-Vitamins Tabs Take 1 tablet by mouth daily.   ofloxacin 0.3 % ophthalmic solution Commonly known as: OCUFLOX Place 1 drop into the right eye 4 (four) times daily.   omeprazole 20 MG capsule Commonly known as: PRILOSEC Take 1 capsule (20 mg total) by mouth daily.   OneTouch Verio test strip Generic drug: glucose blood Use 1 strip via meter three times daily as directed.   prednisoLONE acetate 1 % ophthalmic suspension Commonly known as: PRED FORTE Place 1 drop into the right eye 4 (four) times daily.   sildenafil 100 MG tablet Commonly known as: VIAGRA Take 1 tablet (100 mg total) by mouth daily as needed for erectile dysfunction.  Take two hours prior to intercourse on an empty stomach   tadalafil 20 MG tablet Commonly known as: CIALIS Take 20 mg by mouth daily.   Trulicity 2.54 YH/0.6CB Sopn Generic drug: Dulaglutide Inject 0.75 mg into the skin every Saturday.        Allergies:  Allergies  Allergen Reactions   Other Anaphylaxis    Mushrooms   Lisinopril Cough    Family History: Family History  Problem Relation Age of Onset   Cancer Mother        leukemia  Hypertension Mother    Alcohol abuse Father    Hypertension Brother    Prostate cancer Neg Hx    Bladder Cancer Neg Hx    Kidney disease Neg Hx    Kidney cancer Neg Hx     Social History:  reports that he has quit smoking. He has never used smokeless tobacco. He reports current alcohol use of about 2.0 standard drinks per week. He reports that he does not use drugs.   Physical Exam: BP 135/86    Pulse 99    Ht 5\' 10"  (1.778 m)    Wt 285 lb (129.3 kg)    BMI 40.89 kg/m   Constitutional:  Alert and oriented, No acute distress. HEENT: [place] AT, moist mucus membranes.  Trachea midline, no masses. Cardiovascular: No clubbing, cyanosis, or edema. Respiratory: Normal respiratory effort, no increased work of breathing. Skin: No rashes, bruises or suspicious lesions. Neurologic: Grossly intact, no focal deficits, moving all 4 extremities. Psychiatric: Normal mood and affect.  Laboratory Data:  Lab Results  Component Value Date   CREATININE 1.05 09/27/2021   Lab Results  Component Value Date   HGBA1C 11.3 (A) 11/06/2020    Assessment & Plan:    Unfavorable intermediate risk prostate cancer  - Will complete staging with CT abdomen and pelvis with contrast   - The patient was counseled about the natural history of prostate cancer and the standard treatment options that are available for prostate cancer. It was explained to him how his age and life expectancy, clinical stage, Gleason score, and PSA affect his prognosis, the decision to  proceed with additional staging studies, as well as how that information influences recommended treatment strategies. We discussed the roles for active surveillance, radiation therapy, surgical therapy, androgen deprivation, as well as ablative therapy options for the treatment of prostate cancer as appropriate to his individual cancer situation. We discussed the risks and benefits of these options with regard to their impact on cancer control and also in terms of potential adverse events, complications, and impact on quality of life particularly related to urinary, bowel, and sexual function. The patient was encouraged to ask questions throughout the discussion today and all questions were answered to his stated satisfaction. In addition, the patient was provided with and/or directed to appropriate resources and literature for further education about prostate cancer treatment options.  -We did review the MSK nomogram including his excellent rate of overall survival and disease-free survival.  I reiterated today that with treatment, this disease can be well managed.  -We discussed surgical therapy for prostate cancer including the different available surgical approaches.  Specifically, we discussed robotic prostatectomy with pelvic lymph node dissection based on his restratification.  We discussed, in detail, the risks and expectations of surgery with regard to cancer control, urinary control, and erectile dysfunction as well as expected post operative recovery.  I do have some concerns based on his BMI which is greater than 40, medical conditions including diabetes about his ability to heal recover his continence and erectile issues given his baseline shim.  Hemoglobin A1c's have been greater than 11 in the past.  -Favor referral to radiation oncology to discuss his radiation options.  This would be coupled with 6 months of hormone i.e. chemical castration.  We discussed the side effects of this including  libido issues, loss of muscle mass, increased weight gain amongst others.  He was provided literature about Eligard today.  -We will plan on having him return in 3 to  4 weeks after he meets with Dr. Baruch Gouty of radiation oncology as well as after his CT abdomen pelvis with contrast possibly for Eligard depending on his decision how to proceed   I,Kailey Littlejohn,acting as a scribe for Hollice Espy, MD.,have documented all relevant documentation on the behalf of Hollice Espy, MD,as directed by  Hollice Espy, MD while in the presence of Hollice Espy, MD.  I have reviewed the above documentation for accuracy and completeness, and I agree with the above.   Hollice Espy, MD   [facility] [address] [place],  [address] [phone]  I spent 45 total minutes on the day of the encounter including pre-visit review of the medical record, face-to-face time with the patient, and post visit ordering of labs/imaging/tests.

## 2021-11-27 ENCOUNTER — Ambulatory Visit: Payer: Managed Care, Other (non HMO) | Admitting: Urology

## 2021-11-27 ENCOUNTER — Other Ambulatory Visit: Payer: Self-pay

## 2021-11-27 VITALS — BP 135/86 | HR 99 | Ht 70.0 in | Wt 285.0 lb

## 2021-11-27 DIAGNOSIS — R972 Elevated prostate specific antigen [PSA]: Secondary | ICD-10-CM

## 2021-11-27 DIAGNOSIS — C61 Malignant neoplasm of prostate: Secondary | ICD-10-CM | POA: Diagnosis not present

## 2021-11-29 ENCOUNTER — Encounter: Payer: Self-pay | Admitting: *Deleted

## 2021-11-29 NOTE — Progress Notes (Deleted)
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 Productive with yellow sputum. Clearing throat often. States this has been going on for at least 6 months Discussed OSA. He will start CPAP- tried before over 20 yrs ago. Discussed cough. Imaging hasn't revealed cause. Discussed lisinopril and his mild reflux. We will change him to an ARB for trial, but I emphasized his PCP will manage his BP.  CT cardiac scoring 08/28/21- The heart size is within normal limits. No pericardial fluid is identified. Visualized segments of the thoracic aorta and central pulmonary arteries are normal in caliber. Visualized mediastinum and hilar regions demonstrate no lymphadenopathy or masses. Visualized lungs show no evidence of pulmonary edema, consolidation, pneumothorax, nodule or pleural fluid. Visualized upper abdomen and bony structures are unremarkable. IMPRESSION: Coronary calcium score of 0.  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 HEENT:    headaches, difficulty swallowing, tooth/dental problems, sore throat,       sneezing, itching, ear ache, nasal congestion, post nasal drip, snoring CV:    chest pain, orthopnea, PND, swelling in lower extremities, anasarca,                                   dizziness, palpitations Resp:   shortness of breath with exertion or at rest.                +productive cough,   +non-productive cough, coughing up of blood.              change in color of mucus.  wheezing.   Skin:    rash or lesions. GI:  +heartburn, indigestion, abdominal pain, nausea, vomiting, diarrhea,                 change in bowel habits, loss of appetite GU: dysuria, change in color of urine, no urgency or frequency.   flank pain. MS:   joint pain, stiffness, decreased range of motion, back pain. Neuro-     nothing unusual Psych:  change in mood or affect.  depression or anxiety.   memory loss.   OBJ- Physical Exam General- Alert, Oriented, Affect-appropriate, Distress- none acute, + obese Skin- rash-none, lesions- none, excoriation- none Lymphadenopathy- none Head- atraumatic            Eyes- Gross vision intact, PERRLA, conjunctivae and secretions clear            Ears- Hearing, canals-normal  Nose- Clear, no-Septal dev, mucus, polyps, erosion, perforation             Throat- Mallampati III , mucosa clear , drainage- none, tonsils- atrophic, = teeth Neck- flexible , trachea midline, no stridor , thyroid nl, carotid no bruit Chest - symmetrical excursion , unlabored           Heart/CV- RRR , no murmur , no gallop  , no rub, nl s1 s2                           - JVD- none , edema- none, stasis changes- none, varices- none           Lung- clear to P&A, wheeze- none, cough+dry/ paroxysmal , dullness-none, rub- none           Chest wall-  Abd-  Br/ Gen/ Rectal- Not done, not  indicated Extrem- cyanosis- none, clubbing, none, atrophy- none, strength- nl Neuro- grossly intact to observation

## 2021-11-30 ENCOUNTER — Ambulatory Visit: Payer: Managed Care, Other (non HMO) | Admitting: Internal Medicine

## 2021-11-30 ENCOUNTER — Telehealth: Payer: Self-pay

## 2021-11-30 NOTE — Telephone Encounter (Signed)
-----   Message from Shanon Ace, [place] sent at 11/28/2021 12:37 PM EST ----- Regarding: eligard Hey!   Dr. Erlene Quan wants the patient to have possible Eligard on his follow up. Just wanted to give a heads up  Thanks [place]

## 2021-11-30 NOTE — Telephone Encounter (Signed)
Forms faxed for benefits investigation.

## 2021-12-06 ENCOUNTER — Other Ambulatory Visit: Payer: Self-pay

## 2021-12-06 ENCOUNTER — Encounter: Payer: Self-pay | Admitting: Radiation Oncology

## 2021-12-06 ENCOUNTER — Ambulatory Visit
Admission: RE | Admit: 2021-12-06 | Discharge: 2021-12-06 | Disposition: A | Discharge status: Home / Self Care | Payer: Managed Care, Other (non HMO) | Source: Ambulatory Visit | Attending: Radiation Oncology | Admitting: Radiation Oncology

## 2021-12-06 VITALS — BP 159/101 | HR 98 | Resp 20 | Wt 295.8 lb

## 2021-12-06 DIAGNOSIS — N529 Male erectile dysfunction, unspecified: Secondary | ICD-10-CM | POA: Insufficient documentation

## 2021-12-06 DIAGNOSIS — Z801 Family history of malignant neoplasm of trachea, bronchus and lung: Secondary | ICD-10-CM | POA: Diagnosis not present

## 2021-12-06 DIAGNOSIS — Z87891 Personal history of nicotine dependence: Secondary | ICD-10-CM | POA: Insufficient documentation

## 2021-12-06 DIAGNOSIS — E1165 Type 2 diabetes mellitus with hyperglycemia: Secondary | ICD-10-CM | POA: Diagnosis not present

## 2021-12-06 DIAGNOSIS — G4730 Sleep apnea, unspecified: Secondary | ICD-10-CM | POA: Insufficient documentation

## 2021-12-06 DIAGNOSIS — Z7984 Long term (current) use of oral hypoglycemic drugs: Secondary | ICD-10-CM | POA: Diagnosis not present

## 2021-12-06 DIAGNOSIS — E785 Hyperlipidemia, unspecified: Secondary | ICD-10-CM | POA: Insufficient documentation

## 2021-12-06 DIAGNOSIS — Z79899 Other long term (current) drug therapy: Secondary | ICD-10-CM | POA: Diagnosis not present

## 2021-12-06 DIAGNOSIS — E559 Vitamin D deficiency, unspecified: Secondary | ICD-10-CM | POA: Insufficient documentation

## 2021-12-06 DIAGNOSIS — C61 Malignant neoplasm of prostate: Secondary | ICD-10-CM | POA: Diagnosis present

## 2021-12-06 DIAGNOSIS — I10 Essential (primary) hypertension: Secondary | ICD-10-CM | POA: Diagnosis not present

## 2021-12-06 DIAGNOSIS — Z794 Long term (current) use of insulin: Secondary | ICD-10-CM | POA: Diagnosis not present

## 2021-12-06 DIAGNOSIS — N401 Enlarged prostate with lower urinary tract symptoms: Secondary | ICD-10-CM | POA: Insufficient documentation

## 2021-12-06 DIAGNOSIS — E1169 Type 2 diabetes mellitus with other specified complication: Secondary | ICD-10-CM | POA: Insufficient documentation

## 2021-12-06 NOTE — Consult Note (Signed)
NEW PATIENT EVALUATION  Name: Johnny Maddox  MRN: [id number]  Date:   12/06/2021     DOB: 1956-04-30   This 66 y.o. 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  REFERRING PHYSICIAN: Velna Hatchet, MD  CHIEF COMPLAINT:  Chief Complaint  Patient presents with   Prostate Cancer    Initial consultation    DIAGNOSIS: The encounter diagnosis was Prostate cancer ([place]).   PREVIOUS INVESTIGATIONS:  CT scans abdomen pelvis reviewed Pathology report reviewed Clinical notes reviewed  HPI: Patient is a 66 year old male who presented with an elevated PSA of 7.7.  He also has erectile dysfunction BPH with LUTS as well as uncontrolled type 2 diabetes hypertension and hyperlipidemia.  He underwent transrectal ultrasound-guided biopsy showing 6 of 12 cores positive for adenocarcinoma with 2 cores positive for Gleason 7 (4+3) and 4 cores showing Gleason 6 (3+3).  Prostate volume was 56 cc.  He has been seen by he is having very little lower urinary tract symptoms.  He has been seen by urology and has been recommended for radiation therapy.  Patient has had a CT scan which I have reviewed no formal report was seen although my review does not seem to be any evidence of extracapsular extension or pelvic lymphadenopathy.  PLANNED TREATMENT REGIMEN: I-125 interstitial implant  PAST MEDICAL HISTORY:  has a past medical history of Acute laryngitis, without mention of obstruction, Allergic rhinitis, cause unspecified, Chalazion, Cough, Dermatophytosis of foot, Diabetes mellitus without complication ([place]), ED (erectile dysfunction), Elevated PSA, Hyperlipidemia, Hypertension, Microalbuminuria, Microalbuminuria, Overweight, Prostate cancer ([place]), Sarcoidosis, Seborrheic dermatitis, unspecified, Sleep apnea, Unspecified disorder of autonomic nervous system, Unspecified sleep apnea, Unspecified  venous (peripheral) insufficiency, and Unspecified vitamin D deficiency.    PAST SURGICAL HISTORY:  Past Surgical History:  Procedure Laterality Date   CIRCUMCISION     HYDROCELE EXCISION Right 09/23/2016   Procedure: HYDROCELECTOMY ADULT ;  Surgeon: Hollice Espy, MD;  Location: [facility] ORS;  Service: Urology;  Laterality: Right;  INGUINAL APPROACH   LEFT HEART CATH AND CORONARY ANGIOGRAPHY N/A 10/02/2021   Procedure: LEFT HEART CATH AND CORONARY ANGIOGRAPHY;  Surgeon: Nigel Mormon, MD;  Location: [place] [facility];  Service: Cardiovascular;  Laterality: N/A;   ROTATOR CUFF REPAIR  R 2011, L 2006   L and R shoulder   TONSILLECTOMY AND ADENOIDECTOMY  1964   WISDOM TOOTH EXTRACTION     x4    FAMILY HISTORY: family history includes Alcohol abuse in his father; Cancer in his mother; Cancer - Lung in his brother; Hypertension in his brother and mother; Other in his father; Stroke in his brother.  SOCIAL HISTORY:  reports that he has quit smoking. His smoking use included cigarettes. He has never used smokeless tobacco. He reports current alcohol use of about 2.0 standard drinks per week. He reports that he does not use drugs.  ALLERGIES: Other and Lisinopril  MEDICATIONS:  Current Outpatient Medications  Medication Sig Dispense Refill   atorvastatin (LIPITOR) 40 MG tablet Take 40 mg by mouth daily.     Budeson-Glycopyrrol-Formoterol (BREZTRI AEROSPHERE) 160-9-4.8 MCG/ACT AERO Inhale 2 puffs into the lungs in the morning and at bedtime. 4.8 g 5   Continuous Blood Gluc Receiver (FREESTYLE LIBRE READER) DEVI 1 Device by Other route 3 (three) times daily. E11.65 1 each 0   Continuous Blood Gluc Sensor (FREESTYLE LIBRE 14 DAY SENSOR) MISC USE  TO TEST BLOOD SUGAR, CHANGING EVERY 14 DAYS  12   Dulaglutide (TRULICITY) 2.72 ZD/6.6YQ SOPN Inject 0.75 mg into the skin every Saturday.     finasteride (PROSCAR) 5 MG tablet Take 1 tablet (5 mg total) by mouth daily. 90 tablet 3   fluconazole  (DIFLUCAN) 100 MG tablet Take 100 mg by mouth daily.     furosemide (LASIX) 20 MG tablet Take 20 mg by mouth daily as needed for edema.  3   glucose blood (ONETOUCH VERIO) test strip Use 1 strip via meter three times daily as directed.     HUMALOG KWIKPEN 100 UNIT/ML KiwkPen 20 Units 3 (three) times daily.  12   Insulin Glargine (LANTUS SOLOSTAR) 100 UNIT/ML Solostar Pen Inject 25 Units into the skin at bedtime.     Insulin Pen Needle (B-D ULTRAFINE III SHORT PEN) 31G X 8 MM MISC Use 4 (four) times daily     losartan (COZAAR) 50 MG tablet Take 50 mg by mouth daily.     metFORMIN (GLUCOPHAGE-XR) 500 MG 24 hr tablet Take 1,000 mg by mouth in the morning and at bedtime.  5   metoprolol succinate (TOPROL-XL) 50 MG 24 hr tablet Take 1 tablet (50 mg total) by mouth daily. Take with or immediately following a meal. 30 tablet 3   Multiple Vitamin (MULTI-VITAMINS) TABS Take 1 tablet by mouth daily.     ofloxacin (OCUFLOX) 0.3 % ophthalmic solution Place 1 drop into the right eye 4 (four) times daily.     omeprazole (PRILOSEC) 20 MG capsule Take 1 capsule (20 mg total) by mouth daily. 30 capsule 5   prednisoLONE acetate (PRED FORTE) 1 % ophthalmic suspension Place 1 drop into the right eye 4 (four) times daily.     sildenafil (VIAGRA) 100 MG tablet Take 1 tablet (100 mg total) by mouth daily as needed for erectile dysfunction. Take two hours prior to intercourse on an empty stomach 30 tablet 3   tadalafil (CIALIS) 20 MG tablet Take 20 mg by mouth daily.     No current facility-administered medications for this encounter.    ECOG PERFORMANCE STATUS:  0 - Asymptomatic  REVIEW OF SYSTEMS: Patient denies any weight loss, fatigue, weakness, fever, chills or night sweats. Patient denies any loss of vision, blurred vision. Patient denies any ringing  of the ears or hearing loss. No irregular heartbeat. Patient denies heart murmur or history of fainting. Patient denies any chest pain or pain radiating to her  upper extremities. Patient denies any shortness of breath, difficulty breathing at night, cough or hemoptysis. Patient denies any swelling in the lower legs. Patient denies any nausea vomiting, vomiting of blood, or coffee ground material in the vomitus. Patient denies any stomach pain. Patient states has had normal bowel movements no significant constipation or diarrhea. Patient denies any dysuria, hematuria or significant nocturia. Patient denies any problems walking, swelling in the joints or loss of balance. Patient denies any skin changes, loss of hair or loss of weight. Patient denies any excessive worrying or anxiety or significant depression. Patient denies any problems with insomnia. Patient denies excessive thirst, polyuria, polydipsia. Patient denies any swollen glands, patient denies easy bruising or easy bleeding. Patient denies any recent infections, allergies or URI. Patient "s visual fields have not changed significantly in recent time.   PHYSICAL EXAM: BP (!) 159/101    Pulse 98    Resp 20    Wt 295 lb 12.8 oz (134.2 kg)    BMI 42.44 kg/m  Slightly obese male in NAD well-developed well-nourished patient in NAD. HEENT reveals PERLA, EOMI, discs not visualized.  Oral cavity is clear. No oral mucosal lesions are identified. Neck is clear without evidence of cervical or supraclavicular adenopathy. Lungs are clear to A&P. Cardiac examination is essentially unremarkable with regular rate and rhythm without murmur rub or thrill. Abdomen is benign with no organomegaly or masses noted. Motor sensory and DTR levels are equal and symmetric in the upper and lower extremities. Cranial nerves II through XII are grossly intact. Proprioception is intact. No peripheral adenopathy or edema is identified. No motor or sensory levels are noted. Crude visual fields are within normal range.  LABORATORY DATA: Pathology report reviewed    RADIOLOGY RESULTS: CT scans reviewed compatible with above-stated  findings   IMPRESSION: Stage IIc adenocarcinoma the prostate Gleason 7 (4+3) presenting with a PSA of 53.24 in 66 year old male  PLAN: At this time I believe patient would be a good candidate for I-125 interstitial implant.  I have run the [facility] which shows only a approximate 10 to 13% chance of lymph node involvement and around 50% chance of extracapsular extension.  I believe based on his young age I would favor implant over external beam treatment also based on his body habitus as well as my ability to to deliver significantly higher doses with the implant.  Only problem with my plan is if he is denied cardiac clearance although I do not see that is a major problem.  Risks and benefits of treatment including radiation safety precautions increased lower Neri tract symptoms diarrhea fatigue alteration blood counts all were discussed with the patient in detail.  I have also asked Dr. London Pepper office to start the patient on Eligard 62-month depot.  We will also schedule him for a volume study in anticipation of his implant.  Patient comprehends my recommendations well.  I would like to take this opportunity to thank you for allowing me to participate in the care of your patient.Noreene Filbert, MD

## 2021-12-11 NOTE — Progress Notes (Signed)
12/12/21 3:30 PM   Johnny Maddox 04-Jan-1956 [id number]  Referring provider:  Velna Hatchet, MD [address] [place],  [place] [address] No chief complaint on file.    HPI: Johnny Maddox is a 66 y.o.male  with a personal history of elevated PSA, ED, BPH with LUTS, and left testicular mass, who presents today for CT results.   He underwent biopsy on 11/20/2021. Surgical pathology revealed Gleason 4+3= 7 involving 2 cores affecting up to 75%, and Gleason 3+3 involving 4 cores affecting up to 50%. His TRUS volume was 56 cc.   Uses finasteride intermittently.  He underwent a CT abdomen and pelvis with IV contrast on 12/05/2021 that showed no evidence of metastatic disease.   He was offered gold seed implant. He is worried about seed implants because he heard he would not be able to be around his 98 year old daughter.  He is very nervious today about his dx and implications for his job.    He does mention that he is ready to start ADT x 6 mo today.  Alternatives including orgovix discussed, declined.     PMH: Past Medical History:  Diagnosis Date   Acute laryngitis, without mention of obstruction    Allergic rhinitis, cause unspecified    Chalazion    Cough    Dermatophytosis of foot    Diabetes mellitus without complication (HCC)    ED (erectile dysfunction)    Elevated PSA    Hyperlipidemia    Hypertension    Microalbuminuria    Microalbuminuria    Overweight    Prostate cancer ([place])    Sarcoidosis    Seborrheic dermatitis, unspecified    Sleep apnea    Unspecified disorder of autonomic nervous system    Unspecified sleep apnea    Unspecified venous (peripheral) insufficiency    Unspecified vitamin D deficiency     Surgical History: Past Surgical History:  Procedure Laterality Date   CIRCUMCISION     HYDROCELE EXCISION Right 09/23/2016   Procedure: HYDROCELECTOMY ADULT ;  Surgeon: Hollice Espy, MD;  Location: [facility];  Service: Urology;  Laterality: Right;   INGUINAL APPROACH   LEFT HEART CATH AND CORONARY ANGIOGRAPHY N/A 10/02/2021   Procedure: LEFT HEART CATH AND CORONARY ANGIOGRAPHY;  Surgeon: Nigel Mormon, MD;  Location: [place] [facility];  Service: Cardiovascular;  Laterality: N/A;   ROTATOR CUFF REPAIR  R 2011, L 2006   L and R shoulder   TONSILLECTOMY AND ADENOIDECTOMY  1964   WISDOM TOOTH EXTRACTION     x4    Home Medications:  Allergies as of 12/12/2021       Reactions   Other Anaphylaxis   Mushrooms   Lisinopril Cough        Medication List        Accurate as of December 12, 2021  3:30 PM. If you have any questions, ask your nurse or doctor.          STOP taking these medications    Breztri Aerosphere 160-9-4.8 MCG/ACT Aero Generic drug: Budeson-Glycopyrrol-Formoterol Stopped by: Hollice Espy, MD   fluconazole 100 MG tablet Commonly known as: DIFLUCAN Stopped by: Hollice Espy, MD   furosemide 20 MG tablet Commonly known as: LASIX Stopped by: Hollice Espy, MD   OneTouch Verio test strip Generic drug: glucose blood Stopped by: Hollice Espy, MD       TAKE these medications    atorvastatin 40 MG tablet Commonly known as: LIPITOR Take 40 mg by  mouth daily.   B-D ULTRAFINE III SHORT PEN 31G X 8 MM Misc Generic drug: Insulin Pen Needle Use 4 (four) times daily   finasteride 5 MG tablet Commonly known as: PROSCAR Take 1 tablet (5 mg total) by mouth daily.   FreeStyle Libre 14 Day Sensor Misc USE TO TEST BLOOD SUGAR, CHANGING EVERY 14 DAYS   FreeStyle Libre Reader Devi 1 Device by Other route 3 (three) times daily. E11.65   HumaLOG KwikPen 100 UNIT/ML KwikPen Generic drug: insulin lispro 20 Units 3 (three) times daily.   Lantus SoloStar 100 UNIT/ML Solostar Pen Generic drug: insulin glargine Inject 25 Units into the skin at bedtime.   losartan 50 MG tablet Commonly known as: COZAAR Take 50 mg by mouth daily.   metFORMIN 500 MG 24 hr tablet Commonly known as:  GLUCOPHAGE-XR Take 1,000 mg by mouth in the morning and at bedtime.   metoprolol succinate 50 MG 24 hr tablet Commonly known as: TOPROL-XL Take 1 tablet (50 mg total) by mouth daily. Take with or immediately following a meal.   Multi-Vitamins Tabs Take 1 tablet by mouth daily.   ofloxacin 0.3 % ophthalmic solution Commonly known as: OCUFLOX Place 1 drop into the right eye 4 (four) times daily.   omeprazole 20 MG capsule Commonly known as: PRILOSEC Take 1 capsule (20 mg total) by mouth daily.   prednisoLONE acetate 1 % ophthalmic suspension Commonly known as: PRED FORTE Place 1 drop into the right eye 4 (four) times daily.   sildenafil 100 MG tablet Commonly known as: VIAGRA Take 1 tablet (100 mg total) by mouth daily as needed for erectile dysfunction. Take two hours prior to intercourse on an empty stomach   tadalafil 20 MG tablet Commonly known as: CIALIS Take 20 mg by mouth daily.   Trulicity 4.09 WJ/1.9JY Sopn Generic drug: Dulaglutide Inject 0.75 mg into the skin every Saturday.        Allergies:  Allergies  Allergen Reactions   Other Anaphylaxis    Mushrooms   Lisinopril Cough    Family History: Family History  Problem Relation Age of Onset   Cancer Mother        leukemia   Hypertension Mother    Alcohol abuse Father    Other Father        Father was Murdered @ age 32   Hypertension Brother    Cancer - Lung Brother    Stroke Brother    Prostate cancer Neg Hx    Bladder Cancer Neg Hx    Kidney disease Neg Hx    Kidney cancer Neg Hx     Social History:  reports that he has quit smoking. His smoking use included cigarettes. He has never used smokeless tobacco. He reports current alcohol use of about 2.0 standard drinks per week. He reports that he does not use drugs.   Physical Exam: BP (!) 150/91    Pulse (!) 112    Ht 5\' 10"  (1.778 m)    Wt 295 lb (133.8 kg)    BMI 42.33 kg/m   Constitutional:  Alert and oriented, No acute distress. HEENT:  [place] AT, moist mucus membranes.  Trachea midline, no masses. Cardiovascular: No clubbing, cyanosis, or edema. Respiratory: Normal respiratory effort, no increased work of breathing. Skin: No rashes, bruises or suspicious lesions. Neurologic: Grossly intact, no focal deficits, moving all 4 extremities. Psychiatric: Normal mood and affect.  Laboratory Data:  Lab Results  Component Value Date   CREATININE 1.05 09/27/2021  Lab Results  Component Value Date   HGBA1C 11.3 (A) 11/06/2020    Pertinent Imaging: TECHNIQUE: CT abdomen and pelvis  70 mL  Isovue-370. Sagittal and coronal reconstructions with maximum intensity projections are evaluated on the workstation and stored in PACS. Radiation dose reduction was utilized, (automated exposure control, mA or kV adjustment based on patient size, or iterative image reconstruction).   Comparison: None.   INDICATION:Malignant neoplasm of prostate (#)   FINDINGS:   CHEST:   #  Lungs: Lung bases are clear..     #  Mediastinum:Normal heart size. No pericardial effusion..   ABDOMEN AND PELVIS:   #  Liver: Several cysts are present in left and right lobes largest measuring about 21 mm. There is no biliary dilatation..    #  Gallbladder:Normal.  #  Spleen:Normal  #  Pancreas:Normal.  #  Adrenals:Normal.  #  Kidneys: Normal right kidney. 11 mm low-attenuation lesion in the left upper pole cortex has an attenuation value of 3 units, image 13 series 5, consistent with a small cyst.  #  Ureters: Normal in course and caliber.  #  Urinary Bladder: Nearly empty, grossly normal.  #  Reproductive organs: No free fluid or pelvic mass.  #  Vessels:Vessels are normal in caliber..  #  Lymph nodes:No retroperitoneal or inguinal adenopathy.. Right common femoral node on image 156 series 2 has a normal short axis measurement of just under 10 mm.  #  VO:HYWVP caliber is normal. No evidence of appendicitis, diverticulitis or bowel obstruction. Normal  appendix  #  MSK:  No acute or aggressive bone lesions.. No sclerotic lesions identified.    IMPRESSION:    1. No evidence of metastatic disease.  2. Liver and left renal cortical cysts.   CT imaging personally reviewed today which was delivered on disc, no evidence of metastatic diease  Assessment & Plan:   Prostate cancer  -Intermittent unfavorable risk  - Discussed the benefits of hormone injections today as well as the side effects such as hot flashed.  - Leuprolide (6 month) (ELIGARD) injection 45 mg given today  - Eligard again in 6 months  - PSA; future  -He is scheduled for brachytherapy seed implantation in coordination with Dr Terrial Rhodes [place],acting as a scribe for Hollice Espy, MD.,have documented all relevant documentation on the behalf of Hollice Espy, MD,as directed by  Hollice Espy, MD while in the presence of Hollice Espy, MD.  I have reviewed the above documentation for accuracy and completeness, and I agree with the above.   Hollice Espy, MD   [facility] [address] [place], [place] [address] [phone]

## 2021-12-12 ENCOUNTER — Other Ambulatory Visit: Payer: Self-pay

## 2021-12-12 ENCOUNTER — Encounter: Payer: Self-pay | Admitting: Urology

## 2021-12-12 ENCOUNTER — Ambulatory Visit: Payer: Managed Care, Other (non HMO) | Admitting: Urology

## 2021-12-12 VITALS — BP 150/91 | HR 112 | Ht 70.0 in | Wt 295.0 lb

## 2021-12-12 DIAGNOSIS — R972 Elevated prostate specific antigen [PSA]: Secondary | ICD-10-CM

## 2021-12-12 DIAGNOSIS — C61 Malignant neoplasm of prostate: Secondary | ICD-10-CM

## 2021-12-12 MED ORDER — LEUPROLIDE ACETATE (6 MONTH) 45 MG ~~LOC~~ KIT
45.0000 mg | PACK | Freq: Once | SUBCUTANEOUS | Status: AC
  Administered 2021-12-12: 45 mg via SUBCUTANEOUS

## 2021-12-12 NOTE — Patient Instructions (Signed)
Take Vitamin D 800-1000iu and Calcium 1000-1200mg  daily while on Androgen Deprivation Therapy.

## 2021-12-12 NOTE — Progress Notes (Signed)
Eligard SubQ Injection   Due to Prostate Cancer patient is present today for a Eligard Injection.  Medication: Eligard 6 month Dose: 45 mg  Location: right arm Lot: [id number] Exp: 06/11/23  Patient tolerated well, no complications were noted  Performed by: Verlene Mayer, [place]  Per Dr. Erlene Quan patient is here for a one time dose. Take Vitamin D 800-1000iu and Calcium 1000-1200mg  daily while on Androgen Deprivation Therapy.

## 2021-12-13 ENCOUNTER — Other Ambulatory Visit: Payer: Self-pay

## 2021-12-13 DIAGNOSIS — C61 Malignant neoplasm of prostate: Secondary | ICD-10-CM

## 2021-12-13 NOTE — Progress Notes (Signed)
[facility] Posting Form   Surgery Date/Time: Date: 01/28/2022  Surgeon: Dr. Hollice Espy, MD  Surgery Location: Day Surgery  Inpt ( No  )   Outpt (Yes)   Obs ( No  )   Diagnosis: C61 Prostate Cancer   -CPT: 67893, [phone]  Surgery: Brachytherapy Radioactive Seed Implant  *Orders entered into EPIC  Date: 12/13/21   *Case booked in EPIC  Date: 12/13/21  *Notified pt of Surgery: Date: 12/13/21  *Placed into Prior Authorization Work Fabio Bering Date: 12/13/21   Assistant/laser/rep:No

## 2021-12-18 ENCOUNTER — Other Ambulatory Visit: Payer: Managed Care, Other (non HMO)

## 2021-12-28 NOTE — Telephone Encounter (Signed)
Clinicals faxed to [facility] for approval. Case [id number]. Faxed to [phone]

## 2022-01-02 ENCOUNTER — Other Ambulatory Visit: Payer: Self-pay

## 2022-01-02 ENCOUNTER — Ambulatory Visit
Admission: RE | Admit: 2022-01-02 | Discharge: 2022-01-02 | Disposition: A | Discharge status: Home / Self Care | Payer: Managed Care, Other (non HMO) | Source: Ambulatory Visit | Attending: Radiation Oncology | Admitting: Radiation Oncology

## 2022-01-02 ENCOUNTER — Ambulatory Visit: Admission: RE | Admit: 2022-01-02 | Payer: Managed Care, Other (non HMO) | Source: Home / Self Care

## 2022-01-02 ENCOUNTER — Encounter: Payer: Self-pay | Admitting: Radiation Oncology

## 2022-01-02 VITALS — BP 129/90 | HR 124 | Temp 99.1°F | Ht 70.0 in | Wt 286.6 lb

## 2022-01-02 DIAGNOSIS — I10 Essential (primary) hypertension: Secondary | ICD-10-CM | POA: Insufficient documentation

## 2022-01-02 DIAGNOSIS — D869 Sarcoidosis, unspecified: Secondary | ICD-10-CM | POA: Diagnosis not present

## 2022-01-02 DIAGNOSIS — Z87891 Personal history of nicotine dependence: Secondary | ICD-10-CM | POA: Insufficient documentation

## 2022-01-02 DIAGNOSIS — E559 Vitamin D deficiency, unspecified: Secondary | ICD-10-CM | POA: Insufficient documentation

## 2022-01-02 DIAGNOSIS — G4730 Sleep apnea, unspecified: Secondary | ICD-10-CM | POA: Diagnosis not present

## 2022-01-02 DIAGNOSIS — Z7985 Long-term (current) use of injectable non-insulin antidiabetic drugs: Secondary | ICD-10-CM | POA: Diagnosis not present

## 2022-01-02 DIAGNOSIS — E119 Type 2 diabetes mellitus without complications: Secondary | ICD-10-CM | POA: Diagnosis not present

## 2022-01-02 DIAGNOSIS — C61 Malignant neoplasm of prostate: Secondary | ICD-10-CM

## 2022-01-02 DIAGNOSIS — Z79899 Other long term (current) drug therapy: Secondary | ICD-10-CM | POA: Diagnosis not present

## 2022-01-02 DIAGNOSIS — Z7984 Long term (current) use of oral hypoglycemic drugs: Secondary | ICD-10-CM | POA: Diagnosis not present

## 2022-01-02 DIAGNOSIS — E785 Hyperlipidemia, unspecified: Secondary | ICD-10-CM | POA: Insufficient documentation

## 2022-01-02 SURGERY — ULTRASOUND, PROSTATE, FOR VOLUME DETERMINATION
Anesthesia: Choice

## 2022-01-02 NOTE — Progress Notes (Signed)
Radiation Oncology Follow up Note  Name: Johnny Maddox   Date:   01/02/2022 MRN:  [id number] DOB: [birth year]    This 66 y.o. 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  Seen today for a volume study in anticipation of an I-125 interstitial implant.  He had 6 of 12 cores positive for mostly Gleason 6 although there were 2 cores of Gleason 7 (4+3).  He is clinically doing well today.Marland Kitchen  REFERRING PROVIDER: Velna Hatchet, MD  HPI: As above.  COMPLICATIONS OF TREATMENT: none  FOLLOW UP COMPLIANCE: keeps appointments   PHYSICAL EXAM:  There were no vitals taken for this visit. Well-developed well-nourished patient in NAD. HEENT reveals PERLA, EOMI, discs not visualized.  Oral cavity is clear. No oral mucosal lesions are identified. Neck is clear without evidence of cervical or supraclavicular adenopathy. Lungs are clear to A&P. Cardiac examination is essentially unremarkable with regular rate and rhythm without murmur rub or thrill. Abdomen is benign with no organomegaly or masses noted. Motor sensory and DTR levels are equal and symmetric in the upper and lower extremities. Cranial nerves II through XII are grossly intact. Proprioception is intact. No peripheral adenopathy or edema is identified. No motor or sensory levels are noted. Crude visual fields are within normal range.  RADIOLOGY RESULTS: Ultrasound used for volume study  PLAN: Patient was taken to the cystoscopy suite in the OR. Patient was placed in the low lithotomy position. Foley catheter was placed. Trans-rectal ultrasound probe was inserted into the rectum and prostate seminal vesicles were visualized as well as bladder base. stepping images were performed on a 5 mm increments. Images will be placed in BrachyVision treatment planning system to determine seed placement coordinates for eventual I-125  interstitial implant. Images will be reviewed with the physics and dosimetry staff for final quality approval. I personally was present for the volume study and assisted in delineation of contour volumes.  At the end of the procedure Foley catheter was removed, rectal ultrasound probe was removed. Patient tolerated his procedures extremely well with no side effects or complaints. Patient has given appointment for interstitial implant date. Consent was signed today as well as history and physical performed in preparation for his outpatient surgical implant.     Noreene Filbert, MD

## 2022-01-02 NOTE — H&P (Signed)
H&P  Name: Johnny Maddox  MRN: [id number]  Date:   01/02/2022     DOB: 1956-01-27   This 66 y.o. male patient presents to the clinic for history and physical anticipation of I-125 interstitial implant for stage IIc (T1 cN0 M0) Gleason 7 (4+3) adenocarcinoma the prostate presenting with a PSA of 7.7  REFERRING PHYSICIAN: Velna Hatchet, MD  CHIEF COMPLAINT:  Chief Complaint  Patient presents with   Prostate Cancer    Post volume study    DIAGNOSIS: The encounter diagnosis was Prostate cancer ([place]).   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  Seen today for a volume study in anticipation of an I-125 interstitial implant.  He had 6 of 12 cores positive for mostly Gleason 6 although there were 2 cores of Gleason 7 (4+3).  He is clinically doing well today.  PLANNED TREATMENT REGIMEN: I-125 interstitial implant  PAST MEDICAL HISTORY:  has a past medical history of Acute laryngitis, without mention of obstruction, Allergic rhinitis, cause unspecified, Chalazion, Cough, Dermatophytosis of foot, Diabetes mellitus without complication ([place]), ED (erectile dysfunction), Elevated PSA, Hyperlipidemia, Hypertension, Microalbuminuria, Microalbuminuria, Overweight, Prostate cancer ([place]), Sarcoidosis, Seborrheic dermatitis, unspecified, Sleep apnea, Unspecified disorder of autonomic nervous system, Unspecified sleep apnea, Unspecified venous (peripheral) insufficiency, and Unspecified vitamin D deficiency.    PAST SURGICAL HISTORY:  Past Surgical History:  Procedure Laterality Date   CIRCUMCISION     HYDROCELE EXCISION Right 09/23/2016   Procedure: HYDROCELECTOMY ADULT ;  Surgeon: Hollice Espy, MD;  Location: [facility] ORS;  Service: Urology;  Laterality: Right;  INGUINAL APPROACH   LEFT HEART CATH AND CORONARY  ANGIOGRAPHY N/A 10/02/2021   Procedure: LEFT HEART CATH AND CORONARY ANGIOGRAPHY;  Surgeon: Nigel Mormon, MD;  Location: [facility];  Service: Cardiovascular;  Laterality: N/A;   ROTATOR CUFF REPAIR  R 2011, L 2006   L and R shoulder   TONSILLECTOMY AND ADENOIDECTOMY  1964   WISDOM TOOTH EXTRACTION     x4    FAMILY HISTORY: family history includes Alcohol abuse in his father; Cancer in his mother; Cancer - Lung in his brother; Hypertension in his brother and mother; Other in his father; Stroke in his brother.  SOCIAL HISTORY:  reports that he has quit smoking. His smoking use included cigarettes. He has never used smokeless tobacco. He reports current alcohol use of about 2.0 standard drinks per week. He reports that he does not use drugs.  ALLERGIES: Other and Lisinopril  MEDICATIONS:  Current Outpatient Medications  Medication Sig Dispense Refill   aspirin EC 81 MG tablet Take 81 mg by mouth in the morning. Swallow whole.     atorvastatin (LIPITOR) 40 MG tablet Take 40 mg by mouth in the morning.     Continuous Blood Gluc Receiver (FREESTYLE LIBRE READER) DEVI 1 Device by Other route 3 (three) times daily. E11.65 1 each 0   Continuous Blood Gluc Sensor (FREESTYLE LIBRE 14 DAY SENSOR) MISC USE TO TEST BLOOD SUGAR, CHANGING EVERY 14 DAYS  12   Dulaglutide 1.5 MG/0.5ML SOPN Inject 1.5 mg into the skin once a week. Either Saturdays or Sundays     finasteride (PROSCAR) 5 MG tablet Take 1 tablet (5 mg total) by mouth daily. 90 tablet 3   furosemide (LASIX) 20 MG tablet Take 20 mg by mouth in the morning.  HUMALOG KWIKPEN 100 UNIT/ML KiwkPen Inject 18 Units into the skin with breakfast, with lunch, and with evening meal.  12   Insulin Glargine (LANTUS SOLOSTAR) 100 UNIT/ML Solostar Pen Inject 18 Units into the skin at bedtime.     Insulin Pen Needle (B-D ULTRAFINE III SHORT PEN) 31G X 8 MM MISC Use 4 (four) times daily     ketorolac (ACULAR) 0.4 % SOLN Place 1 drop into the  left eye in the morning, at noon, and at bedtime.     losartan-hydrochlorothiazide (HYZAAR) 50-12.5 MG tablet Take 1 tablet by mouth in the morning.     metFORMIN (GLUCOPHAGE-XR) 500 MG 24 hr tablet Take 1,000 mg by mouth in the morning and at bedtime.  5   metoprolol succinate (TOPROL-XL) 50 MG 24 hr tablet Take 1 tablet (50 mg total) by mouth daily. Take with or immediately following a meal. (Patient not taking: Reported on 12/31/2021) 30 tablet 3   Multiple Vitamin (MULTIVITAMIN WITH MINERALS) TABS tablet Take 1 tablet by mouth in the morning.     ofloxacin (OCUFLOX) 0.3 % ophthalmic solution Place 1 drop into the left eye in the morning, at noon, and at bedtime.     omeprazole (PRILOSEC) 20 MG capsule Take 1 capsule (20 mg total) by mouth daily. (Patient taking differently: Take 20 mg by mouth daily as needed (indigestion/heartburn.).) 30 capsule 5   prednisoLONE acetate (PRED FORTE) 1 % ophthalmic suspension Place 1 drop into the left eye in the morning, at noon, and at bedtime.     sildenafil (VIAGRA) 100 MG tablet Take 1 tablet (100 mg total) by mouth daily as needed for erectile dysfunction. Take two hours prior to intercourse on an empty stomach 30 tablet 3   tadalafil (CIALIS) 20 MG tablet Take 20 mg by mouth in the morning.     No current facility-administered medications for this encounter.    ECOG PERFORMANCE STATUS:  0 - Asymptomatic  REVIEW OF SYSTEMS: Patient denies any weight loss, fatigue, weakness, fever, chills or night sweats. Patient denies any loss of vision, blurred vision. Patient denies any ringing  of the ears or hearing loss. No irregular heartbeat. Patient denies heart murmur or history of fainting. Patient denies any chest pain or pain radiating to her upper extremities. Patient denies any shortness of breath, difficulty breathing at night, cough or hemoptysis. Patient denies any swelling in the lower legs. Patient denies any nausea vomiting, vomiting of blood, or coffee  ground material in the vomitus. Patient denies any stomach pain. Patient states has had normal bowel movements no significant constipation or diarrhea. Patient denies any dysuria, hematuria or significant nocturia. Patient denies any problems walking, swelling in the joints or loss of balance. Patient denies any skin changes, loss of hair or loss of weight. Patient denies any excessive worrying or anxiety or significant depression. Patient denies any problems with insomnia. Patient denies excessive thirst, polyuria, polydipsia. Patient denies any swollen glands, patient denies easy bruising or easy bleeding. Patient denies any recent infections, allergies or URI. Patient "s visual fields have not changed significantly in recent time.   PHYSICAL EXAM: BP 129/90    Pulse (!) 124    Temp 99.1 F (37.3 C)    Ht 5\' 10"  (1.778 m)    Wt 286 lb 9.6 oz (130 kg)    BMI 41.12 kg/m  Well-developed well-nourished patient in NAD. HEENT reveals PERLA, EOMI, discs not visualized.  Oral cavity is clear. No oral mucosal lesions are identified.  Neck is clear without evidence of cervical or supraclavicular adenopathy. Lungs are clear to A&P. Cardiac examination is essentially unremarkable with regular rate and rhythm without murmur rub or thrill. Abdomen is benign with no organomegaly or masses noted. Motor sensory and DTR levels are equal and symmetric in the upper and lower extremities. Cranial nerves II through XII are grossly intact. Proprioception is intact. No peripheral adenopathy or edema is identified. No motor or sensory levels are noted. Crude visual fields are within normal range.  LABORATORY DATA: Pathology report reviewed    RADIOLOGY RESULTS: Ultrasound used today for volume study   IMPRESSION: Stage IIc adenocarcinoma the prostate in 66 year old male for right interstitial implant.  PLAN: This time patient is cleared to proceed with I-125 interstitial implant.  We performed a volume study which we will  use for the basis of RCA placement.  Risks and benefits of treatment including increased lower Neri tract symptoms possible diarrhea fatigue and the risks of general anesthesia were reviewed with the patient.  We also reviewed radiation safety precautions.  He comprehends my recommendations well.  I would like to take this opportunity to thank you for allowing me to participate in the care of your patient.Noreene Filbert, MD

## 2022-01-07 DIAGNOSIS — C61 Malignant neoplasm of prostate: Secondary | ICD-10-CM | POA: Diagnosis not present

## 2022-01-08 NOTE — Telephone Encounter (Signed)
Incoming approval of Lupron  Dates: 12/28/2021 - 12/28/2022

## 2022-01-18 ENCOUNTER — Encounter
Admission: RE | Admit: 2022-01-18 | Discharge: 2022-01-18 | Disposition: A | Discharge status: Home / Self Care | Payer: Managed Care, Other (non HMO) | Source: Ambulatory Visit | Attending: Urology | Admitting: Urology

## 2022-01-18 ENCOUNTER — Other Ambulatory Visit: Payer: Self-pay

## 2022-01-18 ENCOUNTER — Other Ambulatory Visit: Payer: Self-pay | Admitting: Urology

## 2022-01-18 VITALS — Ht 70.0 in | Wt 287.0 lb

## 2022-01-18 DIAGNOSIS — Z79899 Other long term (current) drug therapy: Secondary | ICD-10-CM

## 2022-01-18 NOTE — Progress Notes (Signed)
?  Perioperative Services ?Pre-Admission/Anesthesia Testing ?  ?Date: 01/18/22 ?Name: Johnny Maddox ?MRN:   [id number] ? ?Re: Consideration of preoperative prophylactic antibiotic change  ? ?Request sent to: Hollice Espy, MD (routed and/or faxed via [facility]) ? ?Planned Surgical Procedure(s):  ? ? Case: [id number] Date/Time: 01/28/22 0730  ? Procedure: RADIOACTIVE SEED IMPLANT/BRACHYTHERAPY IMPLANT  ? Anesthesia type: General  ? Pre-op diagnosis: Prostate Cancer  ? Location: [facility] OR ROOM 10 / [facility]S FOR ANESTHESIA GROUP  ? Surgeons: Hollice Espy, MD  ? ?Clinical Notes:  ?Patient has NO documented allergy to PCN  ? ?Request:  ?As an evidence based approach to reducing the rate of incidence for post-operative SSI and the development of MDROs, could an agent with narrower coverage for preoperative prophylaxis in this patient's upcoming surgical course be considered?  ? ?Currently ordered preoperative prophylactic ABX: ciprofloxacin.  ? ?Specifically requesting change to cephalosporin (CEFAZOLIN).  ? ?Please communicate decision with me and I will change the orders in Epic as per your direction.  ? ? ? ?Citation: ?Beckie Salts, Wardell Heath et al: Best practice statement on urologic procedures and antimicrobial prophylaxis.  J Urol 2020; 203: 351.  ? ?Honor Loh, MSN, APRN, FNP-C, CEN ?[name]  ?Peri-operative Services Nurse Practitioner ?FAX: [phone] ?01/18/22 12:01 PM ? ?

## 2022-01-18 NOTE — Patient Instructions (Addendum)
Your procedure is scheduled on: Monday 01/28/22 ?Report to the Registration Desk on the 1st floor of the [facility]. ?To find out your arrival time, please call [phone] between 1PM - 3PM on: Friday 01/25/22 ? ?REMEMBER: ?Instructions that are not followed completely may result in serious medical risk, up to and including death; or upon the discretion of your surgeon and anesthesiologist your surgery may need to be rescheduled. ? ?Do not eat or drink after midnight the night before surgery.  ?No gum chewing, lozengers or hard candies. ? ?TAKE THESE MEDICATIONS THE MORNING OF SURGERY WITH A SIP OF WATER: ?atorvastatin (LIPITOR) 40 MG tablet ?finasteride (PROSCAR) 5 MG tablet (if you take in the morning) ? ?omeprazole (PRILOSEC) 20 MG capsule (take one the night before and one on the morning of surgery - helps to prevent nausea after surgery.) ? ?Take half the dose of Insulin Glargine (LANTUS SOLOSTAR) 100 UNIT/ML Solostar Pen the night prior to surgery ? ?Stop taking Metformin 2 days prior to surgery which means Friday 01/25/22 will be your last day taking this until after surgery ? ?Follow recommendations from Surgeon regarding stopping Aspirin. ? ?One week prior to surgery: ?Stop Anti-inflammatories (NSAIDS) such as Advil, Aleve, Ibuprofen, Motrin, Naproxen, Naprosyn and Aspirin based products such as Excedrin, Goodys Powder, BC Powder. ?Stop taking your Multiple Vitamin (MULTIVITAMIN WITH MINERALS) TABS tablet and ANY other OVER THE COUNTER supplements until after surgery. ?You may however, continue to take Tylenol if needed for pain up until the day of surgery. ? ?No Alcohol for 24 hours before or after surgery. ? ?No Smoking including e-cigarettes for 24 hours prior to surgery.  ?No chewable tobacco products for at least 6 hours prior to surgery.  ?No nicotine patches on the day of surgery. ? ?Do not use any "recreational" drugs for at least a week prior to your surgery.  ?Please be advised that the  combination of cocaine and anesthesia may have negative outcomes, up to and including death. ?If you test positive for cocaine, your surgery will be cancelled. ? ?On the morning of surgery brush your teeth with toothpaste and water, you may rinse your mouth with mouthwash if you wish. ?Do not swallow any toothpaste or mouthwash. ? ?Do not wear jewelry. ? ?Do not wear lotions, powders, or colognes.  ? ?Do not shave body from the neck down 48 hours prior to surgery just in case you cut yourself which could leave a site for infection.  ? ?Your doctor has prescribed a fleets enema. Please use the enema 2 hours prior to surgery arrival as directed. ? ?Do not bring valuables to the hospital. [facility] is not responsible for any missing/lost belongings or valuables.  ? ?Bring your C-PAP to the hospital with you in case you may have to spend the night.  ? ?Notify your doctor if there is any change in your medical condition (cold, fever, infection). ? ?Wear comfortable clothing (specific to your surgery type) to the hospital. ? ?If you are being discharged the day of surgery, you will not be allowed to drive home. ?You will need a responsible adult (18 years or older) to drive you home and stay with you that night.  ? ?If you are taking public transportation, you will need to have a responsible adult (18 years or older) with you. ?Please confirm with your physician that it is acceptable to use public transportation.  ? ?Please call the [place] Dept. at [phone] if you have any questions  about these instructions. ? ?Surgery Visitation Policy: ? ?Patients undergoing a surgery or procedure may have one family member or support person with them as long as that person is not COVID-19 positive or experiencing its symptoms.  ?That person may remain in the waiting area during the procedure and may rotate out with other people. ? ?Inpatient Visitation:   ? ?Visiting hours are 7 a.m. to 8 p.m. ?Up to two  visitors ages 16+ are allowed at one time in a patient room. The visitors may rotate out with other people during the day. Visitors must check out when they leave, or other visitors will not be allowed. One designated support person may remain overnight. ?The visitor must pass COVID-19 screenings, use hand sanitizer when entering and exiting the patient?s room and wear a mask at all times, including in the patient?s room. ?Patients must also wear a mask when staff or their visitor are in the room. ?Masking is required regardless of vaccination status.  ?

## 2022-01-22 ENCOUNTER — Encounter
Admission: RE | Admit: 2022-01-22 | Discharge: 2022-01-22 | Disposition: A | Discharge status: Home / Self Care | Payer: Managed Care, Other (non HMO) | Source: Ambulatory Visit | Attending: Urology | Admitting: Urology

## 2022-01-22 ENCOUNTER — Other Ambulatory Visit: Payer: Self-pay

## 2022-01-22 DIAGNOSIS — Z79899 Other long term (current) drug therapy: Secondary | ICD-10-CM | POA: Diagnosis not present

## 2022-01-22 DIAGNOSIS — C61 Malignant neoplasm of prostate: Secondary | ICD-10-CM | POA: Insufficient documentation

## 2022-01-22 DIAGNOSIS — Z01812 Encounter for preprocedural laboratory examination: Secondary | ICD-10-CM | POA: Insufficient documentation

## 2022-01-22 DIAGNOSIS — Z5181 Encounter for therapeutic drug level monitoring: Secondary | ICD-10-CM | POA: Insufficient documentation

## 2022-01-22 LAB — CBC
HCT: 44.4 % (ref 39.0–52.0)
Hemoglobin: 13.8 g/dL (ref 13.0–17.0)
MCH: 26.3 pg (ref 26.0–34.0)
MCHC: 31.1 g/dL (ref 30.0–36.0)
MCV: 84.6 fL (ref 80.0–100.0)
Platelets: 137 10*3/uL — ABNORMAL LOW (ref 150–400)
RBC: 5.25 MIL/uL (ref 4.22–5.81)
RDW: 13.7 % (ref 11.5–15.5)
WBC: 4.2 10*3/uL (ref 4.0–10.5)
nRBC: 0 % (ref 0.0–0.2)

## 2022-01-22 LAB — POTASSIUM: Potassium: 3.7 mmol/L (ref 3.5–5.1)

## 2022-01-23 DIAGNOSIS — Z01812 Encounter for preprocedural laboratory examination: Secondary | ICD-10-CM | POA: Diagnosis not present

## 2022-01-27 MED ORDER — CHLORHEXIDINE GLUCONATE 0.12 % MT SOLN: 15.0000 mL | Freq: Once | OROMUCOSAL | Status: AC

## 2022-01-27 MED ORDER — ORAL CARE MOUTH RINSE: 15.0000 mL | Freq: Once | OROMUCOSAL | Status: AC

## 2022-01-27 MED ORDER — FLEET ENEMA 7-19 GM/118ML RE ENEM: 1.0000 | ENEMA | Freq: Once | RECTAL | Status: DC

## 2022-01-27 MED ORDER — SODIUM CHLORIDE 0.9 % IV SOLN: INTRAVENOUS | Status: DC

## 2022-01-27 MED ORDER — CIPROFLOXACIN IN D5W 400 MG/200ML IV SOLN
400.0000 mg | INTRAVENOUS | Status: AC
  Administered 2022-01-28: 400 mg via INTRAVENOUS

## 2022-01-28 ENCOUNTER — Ambulatory Visit: Payer: Medicare Other

## 2022-01-28 ENCOUNTER — Ambulatory Visit: Payer: Managed Care, Other (non HMO) | Admitting: Anesthesiology

## 2022-01-28 ENCOUNTER — Encounter
Admission: RE | Disposition: A | Discharge status: Home / Self Care | Payer: Self-pay | Source: Home / Self Care | Attending: Urology

## 2022-01-28 ENCOUNTER — Ambulatory Visit: Payer: Managed Care, Other (non HMO)

## 2022-01-28 ENCOUNTER — Encounter: Payer: Self-pay | Admitting: Urology

## 2022-01-28 ENCOUNTER — Ambulatory Visit
Admission: RE | Admit: 2022-01-28 | Discharge: 2022-01-28 | Disposition: A | Discharge status: Home / Self Care | Payer: Managed Care, Other (non HMO) | Attending: Urology | Admitting: Urology

## 2022-01-28 DIAGNOSIS — G4730 Sleep apnea, unspecified: Secondary | ICD-10-CM | POA: Insufficient documentation

## 2022-01-28 DIAGNOSIS — I25118 Atherosclerotic heart disease of native coronary artery with other forms of angina pectoris: Secondary | ICD-10-CM | POA: Diagnosis not present

## 2022-01-28 DIAGNOSIS — I10 Essential (primary) hypertension: Secondary | ICD-10-CM | POA: Insufficient documentation

## 2022-01-28 DIAGNOSIS — Z79899 Other long term (current) drug therapy: Secondary | ICD-10-CM | POA: Diagnosis not present

## 2022-01-28 DIAGNOSIS — E119 Type 2 diabetes mellitus without complications: Secondary | ICD-10-CM | POA: Diagnosis not present

## 2022-01-28 DIAGNOSIS — Z7984 Long term (current) use of oral hypoglycemic drugs: Secondary | ICD-10-CM | POA: Insufficient documentation

## 2022-01-28 DIAGNOSIS — Z6841 Body Mass Index (BMI) 40.0 and over, adult: Secondary | ICD-10-CM | POA: Insufficient documentation

## 2022-01-28 DIAGNOSIS — Z87891 Personal history of nicotine dependence: Secondary | ICD-10-CM | POA: Insufficient documentation

## 2022-01-28 DIAGNOSIS — C61 Malignant neoplasm of prostate: Secondary | ICD-10-CM

## 2022-01-28 HISTORY — PX: RADIOACTIVE SEED IMPLANT: SHX5150

## 2022-01-28 LAB — GLUCOSE, CAPILLARY
Glucose-Capillary: 138 mg/dL — ABNORMAL HIGH (ref 70–99)
Glucose-Capillary: 156 mg/dL — ABNORMAL HIGH (ref 70–99)

## 2022-01-28 SURGERY — INSERTION, RADIATION SOURCE, PROSTATE
Anesthesia: General

## 2022-01-28 MED ORDER — IPRATROPIUM-ALBUTEROL 0.5-2.5 (3) MG/3ML IN SOLN
RESPIRATORY_TRACT | Status: AC
  Filled 2022-01-28: qty 3

## 2022-01-28 MED ORDER — IPRATROPIUM-ALBUTEROL 0.5-2.5 (3) MG/3ML IN SOLN
3.0000 mL | Freq: Once | RESPIRATORY_TRACT | Status: AC
  Administered 2022-01-28: 3 mL via RESPIRATORY_TRACT

## 2022-01-28 MED ORDER — DEXAMETHASONE SODIUM PHOSPHATE 10 MG/ML IJ SOLN
INTRAMUSCULAR | Status: DC | PRN
Start: 2022-01-28 — End: 2022-01-28
  Administered 2022-01-28: 5 mg via INTRAVENOUS

## 2022-01-28 MED ORDER — FENTANYL CITRATE (PF) 100 MCG/2ML IJ SOLN
INTRAMUSCULAR | Status: AC
  Filled 2022-01-28: qty 2

## 2022-01-28 MED ORDER — ONDANSETRON HCL 4 MG/2ML IJ SOLN
INTRAMUSCULAR | Status: DC | PRN
  Administered 2022-01-28 (×2): 4 mg via INTRAVENOUS

## 2022-01-28 MED ORDER — SUCCINYLCHOLINE CHLORIDE 200 MG/10ML IV SOSY
PREFILLED_SYRINGE | INTRAVENOUS | Status: DC | PRN
  Administered 2022-01-28: 120 mg via INTRAVENOUS

## 2022-01-28 MED ORDER — IPRATROPIUM-ALBUTEROL 0.5-2.5 (3) MG/3ML IN SOLN
3.0000 mL | RESPIRATORY_TRACT | Status: DC
Start: 2022-01-28 — End: 2022-01-28

## 2022-01-28 MED ORDER — PHENYLEPHRINE HCL-NACL 20-0.9 MG/250ML-% IV SOLN
INTRAVENOUS | Status: AC
Start: 2022-01-28 — End: ?
  Filled 2022-01-28: qty 250

## 2022-01-28 MED ORDER — ROCURONIUM BROMIDE 100 MG/10ML IV SOLN
INTRAVENOUS | Status: DC | PRN
  Administered 2022-01-28: 40 mg via INTRAVENOUS
  Administered 2022-01-28: 10 mg via INTRAVENOUS

## 2022-01-28 MED ORDER — PHENYLEPHRINE 40 MCG/ML (10ML) SYRINGE FOR IV PUSH (FOR BLOOD PRESSURE SUPPORT)
PREFILLED_SYRINGE | INTRAVENOUS | Status: DC | PRN
  Administered 2022-01-28: 40 ug via INTRAVENOUS

## 2022-01-28 MED ORDER — PROPOFOL 10 MG/ML IV BOLUS
INTRAVENOUS | Status: AC
  Filled 2022-01-28: qty 20

## 2022-01-28 MED ORDER — EPHEDRINE SULFATE (PRESSORS) 50 MG/ML IJ SOLN
INTRAMUSCULAR | Status: DC | PRN
  Administered 2022-01-28: 5 mg via INTRAVENOUS

## 2022-01-28 MED ORDER — SODIUM CHLORIDE FLUSH 0.9 % IV SOLN
INTRAVENOUS | Status: AC
  Filled 2022-01-28: qty 10

## 2022-01-28 MED ORDER — MIDAZOLAM HCL 2 MG/2ML IJ SOLN
INTRAMUSCULAR | Status: AC
  Filled 2022-01-28: qty 2

## 2022-01-28 MED ORDER — ONDANSETRON HCL 4 MG/2ML IJ SOLN: 4.0000 mg | Freq: Once | INTRAMUSCULAR | Status: DC | PRN

## 2022-01-28 MED ORDER — LIDOCAINE HCL (CARDIAC) PF 100 MG/5ML IV SOSY
PREFILLED_SYRINGE | INTRAVENOUS | Status: DC | PRN
Start: 2022-01-28 — End: 2022-01-28
  Administered 2022-01-28: 100 mg via INTRAVENOUS

## 2022-01-28 MED ORDER — MIDAZOLAM HCL 2 MG/2ML IJ SOLN
INTRAMUSCULAR | Status: DC | PRN
  Administered 2022-01-28: 2 mg via INTRAVENOUS

## 2022-01-28 MED ORDER — TAMSULOSIN HCL 0.4 MG PO CAPS
0.4000 mg | ORAL_CAPSULE | Freq: Every day | ORAL | 0 refills | Status: DC
Start: 2022-01-28 — End: 2022-02-25

## 2022-01-28 MED ORDER — BACITRACIN ZINC 500 UNIT/GM EX OINT
TOPICAL_OINTMENT | CUTANEOUS | Status: AC
  Filled 2022-01-28: qty 28.35

## 2022-01-28 MED ORDER — PROPOFOL 10 MG/ML IV BOLUS
INTRAVENOUS | Status: DC | PRN
  Administered 2022-01-28: 200 mg via INTRAVENOUS

## 2022-01-28 MED ORDER — CHLORHEXIDINE GLUCONATE 0.12 % MT SOLN
OROMUCOSAL | Status: AC
  Administered 2022-01-28: 15 mL via OROMUCOSAL
  Filled 2022-01-28: qty 15

## 2022-01-28 MED ORDER — ACETAMINOPHEN 10 MG/ML IV SOLN
INTRAVENOUS | Status: AC
  Filled 2022-01-28: qty 100

## 2022-01-28 MED ORDER — PROPOFOL 500 MG/50ML IV EMUL
INTRAVENOUS | Status: AC
  Filled 2022-01-28: qty 100

## 2022-01-28 MED ORDER — FENTANYL CITRATE (PF) 100 MCG/2ML IJ SOLN
INTRAMUSCULAR | Status: DC | PRN
  Administered 2022-01-28 (×2): 50 ug via INTRAVENOUS

## 2022-01-28 MED ORDER — FENTANYL CITRATE (PF) 100 MCG/2ML IJ SOLN
25.0000 ug | INTRAMUSCULAR | Status: DC | PRN
  Administered 2022-01-28 (×3): 25 ug via INTRAVENOUS

## 2022-01-28 MED ORDER — CIPROFLOXACIN IN D5W 400 MG/200ML IV SOLN
INTRAVENOUS | Status: AC
  Filled 2022-01-28: qty 200

## 2022-01-28 MED ORDER — HYDROCODONE-ACETAMINOPHEN 5-325 MG PO TABS
1.0000 | ORAL_TABLET | Freq: Four times a day (QID) | ORAL | 0 refills | Status: DC | PRN
Start: 2022-01-28 — End: 2022-07-25

## 2022-01-28 MED ORDER — SUGAMMADEX SODIUM 500 MG/5ML IV SOLN
INTRAVENOUS | Status: DC | PRN
  Administered 2022-01-28: 500 mg via INTRAVENOUS

## 2022-01-28 MED ORDER — GLYCOPYRROLATE 0.2 MG/ML IJ SOLN
INTRAMUSCULAR | Status: DC | PRN
  Administered 2022-01-28: .2 mg via INTRAVENOUS

## 2022-01-28 MED ORDER — SODIUM CHLORIDE 0.9 % IR SOLN
Status: DC | PRN
  Administered 2022-01-28: 100 mL

## 2022-01-28 MED ORDER — ACETAMINOPHEN 10 MG/ML IV SOLN
INTRAVENOUS | Status: DC | PRN
Start: 2022-01-28 — End: 2022-01-28
  Administered 2022-01-28: 1000 mg via INTRAVENOUS

## 2022-01-28 SURGICAL SUPPLY — 28 items
BAG DRN RND TRDRP ANRFLXCHMBR (UROLOGICAL SUPPLIES) ×1
BAG URINE DRAIN 2000ML AR STRL (UROLOGICAL SUPPLIES) ×2 IMPLANT
BLADE CLIPPER SURG (BLADE) ×2 IMPLANT
CATH FOL 2WAY LX 16X5 (CATHETERS) ×2 IMPLANT
COVER BACK TABLE REUSABLE LG (DRAPES) ×2 IMPLANT
DRAPE INCISE 23X17 IOBAN STRL (DRAPES) ×1
DRAPE INCISE 23X17 STRL (DRAPES) ×1 IMPLANT
DRAPE INCISE IOBAN 23X17 STRL (DRAPES) ×1 IMPLANT
DRAPE UNDER BUTTOCK W/FLU (DRAPES) ×2 IMPLANT
DRSG TELFA 3X8 NADH (GAUZE/BANDAGES/DRESSINGS) ×2 IMPLANT
GAUZE 4X4 16PLY ~~LOC~~+RFID DBL (SPONGE) ×4 IMPLANT
GLOVE SURG ENC MOIS LTX SZ6.5 (GLOVE) ×4 IMPLANT
GLOVE SURG ENC MOIS LTX SZ7.5 (GLOVE) ×4 IMPLANT
GOWN STRL REUS W/ TWL LRG LVL3 (GOWN DISPOSABLE) ×2 IMPLANT
GOWN STRL REUS W/ TWL XL LVL3 (GOWN DISPOSABLE) ×1 IMPLANT
GOWN STRL REUS W/TWL LRG LVL3 (GOWN DISPOSABLE) ×4
GOWN STRL REUS W/TWL XL LVL3 (GOWN DISPOSABLE) ×2
IV NS 1000ML (IV SOLUTION) ×2
IV NS 1000ML BAXH (IV SOLUTION) ×1 IMPLANT
KIT TURNOVER CYSTO (KITS) ×2 IMPLANT
MANIFOLD NEPTUNE II (INSTRUMENTS) ×2 IMPLANT
PACK CYSTO AR (MISCELLANEOUS) ×2 IMPLANT
PAD DRESSING TELFA 3X8 NADH (GAUZE/BANDAGES/DRESSINGS) ×1 IMPLANT
SET CYSTO W/LG BORE CLAMP LF (SET/KITS/TRAYS/PACK) ×2 IMPLANT
SURGILUBE 2OZ TUBE FLIPTOP (MISCELLANEOUS) ×2 IMPLANT
SYR 10ML LL (SYRINGE) ×2 IMPLANT
WATER STERILE IRR 1000ML POUR (IV SOLUTION) ×2 IMPLANT
WATER STERILE IRR 500ML POUR (IV SOLUTION) ×2 IMPLANT

## 2022-01-28 NOTE — Anesthesia Procedure Notes (Signed)
Procedure Name: Intubation ?Date/Time: 01/28/2022 7:51 AM ?Performed by: Kelton Pillar, CRNA ?Pre-anesthesia Checklist: Patient identified, Patient being monitored, Timeout performed, Emergency Drugs available and Suction available ?Patient Re-evaluated:Patient Re-evaluated prior to induction ?Oxygen Delivery Method: Circle system utilized ?Preoxygenation: Pre-oxygenation with 100% oxygen ?Induction Type: IV induction ?Ventilation: Mask ventilation without difficulty ?Laryngoscope Size: 3, McGraph and 4 ?Grade View: Grade I ?Tube type: Oral ?Tube size: 7.5 mm ?Number of attempts: 1 ?Airway Equipment and Method: Stylet ?Placement Confirmation: ETT inserted through vocal cords under direct vision, positive ETCO2 and breath sounds checked- equal and bilateral ?Secured at: 22 cm ?Tube secured with: Tape ?Dental Injury: Teeth and Oropharynx as per pre-operative assessment  ? ? ? ? ?

## 2022-01-28 NOTE — Op Note (Signed)
Radiation Oncology ?I-125 interstitial implant note ? ?Name: Johnny Maddox   ?Date:   12/13/2021 ?MRN:  [id number] ?DOB: Mar 24, 1956  ? ? ?This 66 y.o. male presents to the OR today for I-125 interstitial implant for Gleason 7 (4+3) adenocarcinoma presenting with a PSA of 7.7 ? ?REFERRING PROVIDER: No ref. provider found ? ?HPI: Patient is a 66 year old male who presented with stage IIc Gleason 7 (4+3) adenocarcinoma the prostate with a PSA of 7.7.  He was taken to the OR today for interstitial implant.. ? ?COMPLICATIONS OF TREATMENT: none ? ?FOLLOW UP COMPLIANCE: keeps appointments  ? ?PHYSICAL EXAM:  ?BP (!) 144/85   Pulse (!) 107   Temp (!) 97.4 ?F (36.3 ?C) (Temporal)   Resp 20   Ht '5\' 10"'$  (1.778 m)   Wt 287 lb (130.2 kg)   SpO2 94%   BMI 41.18 kg/m?  ?Well-developed well-nourished patient in NAD. HEENT reveals PERLA, EOMI, discs not visualized.  Oral cavity is clear. No oral mucosal lesions are identified. Neck is clear without evidence of cervical or supraclavicular adenopathy. Lungs are clear to A&P. Cardiac examination is essentially unremarkable with regular rate and rhythm without murmur rub or thrill. Abdomen is benign with no organomegaly or masses noted. Motor sensory and DTR levels are equal and symmetric in the upper and lower extremities. Cranial nerves II through XII are grossly intact. Proprioception is intact. No peripheral adenopathy or edema is identified. No motor or sensory levels are noted. Crude visual fields are within normal range. ? ?RADIOLOGY RESULTS: Ultrasound used for source placement ? ?PLAN: Patient was taken to the operating room and general anesthesia was administered. Legs were immobilized in stirrups and patient was positioned in the exact same proportions as original volume study. Patient was prepped and Foley catheter was placed. Ultrasound guidance identified the prostate and recreated the original set up as per treatment planning volume study.  A needle grid was attached to  the ultrasound probe to position the needles.  28needles were placed under ultrasound guidance  to the  prostate PTV  . After completion of procedure cystoscopy was performed by urology and no evidence of seeds in the bladder were noted. Patient tolerated the procedure extremely well. Initial plain film as doublecheck identified 88 seeds in the prostate. Patient has followup appointment in one month for CT scan for quality assurance will be performed.Prior to implant 10% or 10 loose seeds which is ever higher were ordered and assayed to verify source strength. ? ?  ? Noreene Filbert, MD ? ?

## 2022-01-28 NOTE — Progress Notes (Signed)
?   01/28/22 0700  ?Clinical Encounter Type  ?Visited With Patient  ?Visit Type Initial;Pre-op  ?Referral From Nurse  ?Spiritual Encounters  ?Spiritual Needs Prayer  ? ?Chaplain provided support through meaningful conversation, compassionate presence and prayer. ?

## 2022-01-28 NOTE — Interval H&P Note (Signed)
History and Physical Interval Note: ? ?01/28/2022 ?7:22 AM ? ?Johnny Maddox  has presented today for surgery, with the diagnosis of Prostate Cancer.  The various methods of treatment have been discussed with the patient and family. After consideration of risks, benefits and other options for treatment, the patient has consented to  Procedure(s): ?RADIOACTIVE SEED IMPLANT/BRACHYTHERAPY IMPLANT (N/A) as a surgical intervention.  The patient's history has been reviewed, patient examined, no change in status, stable for surgery.  I have reviewed the patient's chart and labs.  Questions were answered to the patient's satisfaction.   ? ?RRR ?Ctab  ? ? ?Hollice Espy ? ? ?

## 2022-01-28 NOTE — Transfer of Care (Signed)
Immediate Anesthesia Transfer of Care Note ? ?Patient: Johnny Maddox ? ?Procedure(s) Performed: RADIOACTIVE SEED IMPLANT/BRACHYTHERAPY IMPLANT ? ?Patient Location: PACU ? ?Anesthesia Type:General ? ?Level of Consciousness: awake ? ?Airway & Oxygen Therapy: Patient Spontanous Breathing and Patient connected to face mask oxygen ? ?Post-op Assessment: Report given to RN and Post -op Vital signs reviewed and stable ? ?Post vital signs: Reviewed ? ?Last Vitals:  ?Vitals Value Taken Time  ?BP    ?Temp    ?Pulse 105 01/28/22 0840  ?Resp 28 01/28/22 0840  ?SpO2 92 % 01/28/22 0840  ?Vitals shown include unvalidated device data. ? ?Last Pain:  ?Vitals:  ? 01/28/22 0628  ?TempSrc: Temporal  ?PainSc: 0-No pain  ?   ? ?  ? ?Complications: No notable events documented. ?

## 2022-01-28 NOTE — Op Note (Signed)
Preoperative diagnosis: Adenocarcinoma of the prostate  ? ?Postoperative diagnosis: Same  ? ?Procedure: I-125 prostate seed implantation, cystoscopy ? ?Surgeon: Hollice Espy M.D. , ? ?Radiation Oncology: Lavena Stanford, M.D.  ? ?Anesthesia: General ? ?Drains: none ? ?Complications: none ? ?Indications: Prostate cancer ? ?Procedure: Patient was brought to operating suite and placement table in the supine position. At this time, a universal timeout protocol was performed, all team members were identified, Venodyne boots are placed, and he was administered IV Ancef in the preoperative period. He was placed in lithotomy position and prepped and draped in usual manner. Radiation oncology department placed a transrectal ultrasound probe anchoring stand/ grid and aligned with previous imaging from the volume study. Foley catheter was inserted without difficulty.  All needle passage was done with real-time transrectal ultrasound guidance in both the transverse and sagittal plains in order to achieve the desired preplanned position. A total of 28 needles were placed.  88 active seeds were implanted. The Foley catheter was removed and a rigid cystoscopy failed to show any seeds outside the prostate without evidence of trauma to the urethral, prostatic fossa, or bladder.  The bladder was drained.  A fluoroscopic image was then obtained showing excellent distrubution of the brachytherapy seeds.  Each seed was counted and counts were correct.   ? ?The patient was then repositioned in the supine position, reversed from anesthesia, and taken to the PACU in stable condition. ?  ?

## 2022-01-28 NOTE — Discharge Instructions (Addendum)

## 2022-01-28 NOTE — Progress Notes (Signed)
Dr Erlene Quan aware of void and post void scan and oked patient to be discharged ?

## 2022-01-28 NOTE — Progress Notes (Signed)
Patient breathing is much better, nonlabored and coughing has subsided. Room air sats 98 %. On 2 liters [place] O2 at this time due to drowsiness from pain medication. ?

## 2022-01-28 NOTE — Anesthesia Preprocedure Evaluation (Signed)
Anesthesia Evaluation  ?Patient identified by MRN, date of birth, ID band ?Patient awake ? ? ? ?Reviewed: ?Allergy & Precautions, H&P , NPO status , Patient's Chart, lab work & pertinent test results, reviewed documented beta blocker date and time  ? ?Airway ?Mallampati: III ? ?TM Distance: >3 FB ?Neck ROM: full ? ? ? Dental ? ?(+) Teeth Intact ?  ?Pulmonary ?sleep apnea , former smoker,  ?  ?Pulmonary exam normal ? ? ? ? ? ? ? Cardiovascular ?Exercise Tolerance: Good ?hypertension, On Medications ?+ angina with exertion + CAD  ?Normal cardiovascular exam ?Rate:Normal ? ? ?  ?Neuro/Psych ?PSYCHIATRIC DISORDERS negative neurological ROS ?   ? GI/Hepatic ?negative GI ROS, Neg liver ROS,   ?Endo/Other  ?diabetes, Well Controlled, Type 2, Oral Hypoglycemic AgentsMorbid obesity ? Renal/GU ?negative Renal ROS  ?negative genitourinary ?  ?Musculoskeletal ? ? Abdominal ?  ?Peds ? Hematology ?negative hematology ROS ?(+)   ?Anesthesia Other Findings ? ? Reproductive/Obstetrics ?negative OB ROS ? ?  ? ? ? ? ? ? ? ? ? ? ? ? ? ?  ?  ? ? ? ? ? ? ? ? ?Anesthesia Physical ?Anesthesia Plan ? ?ASA: 3 ? ?Anesthesia Plan: General LMA  ? ?Post-op Pain Management:   ? ?Induction:  ? ?PONV Risk Score and Plan:  ? ?Airway Management Planned:  ? ?Additional Equipment:  ? ?Intra-op Plan:  ? ?Post-operative Plan:  ? ?Informed Consent: I have reviewed the patients History and Physical, chart, labs and discussed the procedure including the risks, benefits and alternatives for the proposed anesthesia with the patient or authorized representative who has indicated his/her understanding and acceptance.  ? ? ? ? ? ?Plan Discussed with: CRNA ? ?Anesthesia Plan Comments:   ? ? ? ? ? ? ?Anesthesia Quick Evaluation ? ?

## 2022-01-30 ENCOUNTER — Telehealth: Payer: Self-pay | Admitting: Urology

## 2022-01-30 NOTE — Telephone Encounter (Signed)
Patient called the office today with complaint that his pain meds are not working.  ? ?Patient had seed implant/brachytherapy with Dr. Erlene Quan on 01/28/22. ? ?Please call patient with advice. ?

## 2022-01-30 NOTE — Anesthesia Postprocedure Evaluation (Signed)
Anesthesia Post Note ? ?Patient: Johnny Maddox ? ?Procedure(s) Performed: RADIOACTIVE SEED IMPLANT/BRACHYTHERAPY IMPLANT ? ?Patient location during evaluation: PACU ?Anesthesia Type: General ?Level of consciousness: awake and alert ?Pain management: pain level controlled ?Vital Signs Assessment: post-procedure vital signs reviewed and stable ?Respiratory status: spontaneous breathing, nonlabored ventilation, respiratory function stable and patient connected to nasal cannula oxygen ?Cardiovascular status: blood pressure returned to baseline and stable ?Postop Assessment: no apparent nausea or vomiting ?Anesthetic complications: no ? ? ?No notable events documented. ? ? ?Last Vitals:  ?Vitals:  ? 01/28/22 1010 01/28/22 1118  ?BP: (!) 144/85 139/81  ?Pulse: (!) 107 (!) 102  ?Resp: 20 20  ?Temp: (!) 36.3 ?C 36.7 ?C  ?SpO2: 94% 93%  ?  ?Last Pain:  ?Vitals:  ? 01/29/22 1308  ?TempSrc:   ?PainSc: 8   ? ? ?  ?  ?  ?  ?  ?  ? ?Molli Barrows ? ? ? ? ?

## 2022-01-30 NOTE — Telephone Encounter (Signed)
Called patient to offer Toradol injection per Dr. Erlene Quan, patient declined symptoms have improved. Will call the office with any changes.  ?

## 2022-01-30 NOTE — Telephone Encounter (Addendum)
Patient has been alternating with motrin and norco with no improvement in pain. Pain is described as needle prick. Per Dr. Erlene Quan take Tylenol with alternating Motrin and Norco. Voiced understanding. Patient was unhappy said "this is all she can offer am not going to go off on a tirate." Listened to concerns, offered recommendations informed to call the office with any other changes or worsening pain.  ?

## 2022-02-04 ENCOUNTER — Telehealth: Payer: Self-pay | Admitting: Urology

## 2022-02-04 NOTE — Telephone Encounter (Signed)
Johnny Maddox wants to know when he needs to follow up with Dr. Erlene Quan after his radiation seeds were placed. Please advise ?

## 2022-02-04 NOTE — Telephone Encounter (Signed)
See other encounter.

## 2022-02-05 ENCOUNTER — Telehealth: Payer: Self-pay

## 2022-02-05 NOTE — Telephone Encounter (Signed)
Patient called would like to know if he should still be taking Proscar and Tamsulosin post brachytherapy seed impants done on 01/28/22.  ?

## 2022-02-05 NOTE — Telephone Encounter (Signed)
Patient called in today. He states that he had Brachytherapy last Monday. He would like to know how long to continue Flomax and Finasteride? States that he called about this information yesterday and received no answer, I explained about Dr. Erlene Quan being out of the office this week and he asked If I could send to another provider.  ?

## 2022-02-05 NOTE — Telephone Encounter (Signed)
Spoke with pt. Pt. Advised of recommendations and verbalized understanding.  ?

## 2022-02-11 NOTE — Telephone Encounter (Signed)
Yes, often patients have irritative urinary symptoms after brachy seed implant and staying on these medications will hopefully help to stabilize his symptoms.  We can discuss stopping them after a period of time depending on how his symptoms evolve.  Please ask him to bring this up at his next follow-up. ? ?Hollice Espy, MD ? ?

## 2022-02-18 ENCOUNTER — Telehealth: Payer: Self-pay | Admitting: Urology

## 2022-02-18 NOTE — Telephone Encounter (Signed)
Would you notify Mr. Peary that we received a letter from his insurance company, [employer]ella [employer]uermann, from a Corporate treasurer, RN who is stating that she is a special tea case manager-[employer] and she is asking for information regarding Mr. Bunda medical history?  Is he aware of this individual?  If so, would he like [place] to send his medical records to her?  If he would like to speak with her, she left a number [phone]. ?

## 2022-02-19 NOTE — Telephone Encounter (Signed)
Tried calling phone just rang ?

## 2022-02-25 ENCOUNTER — Ambulatory Visit
Admission: RE | Admit: 2022-02-25 | Discharge: 2022-02-25 | Disposition: A | Discharge status: Home / Self Care | Payer: Managed Care, Other (non HMO) | Source: Ambulatory Visit | Attending: Radiation Oncology | Admitting: Radiation Oncology

## 2022-02-25 ENCOUNTER — Other Ambulatory Visit: Payer: Self-pay | Admitting: *Deleted

## 2022-02-25 ENCOUNTER — Encounter: Payer: Self-pay | Admitting: Radiation Oncology

## 2022-02-25 VITALS — BP 154/86 | HR 104 | Temp 97.2°F | Resp 18 | Ht 70.0 in | Wt 293.7 lb

## 2022-02-25 DIAGNOSIS — C61 Malignant neoplasm of prostate: Secondary | ICD-10-CM | POA: Insufficient documentation

## 2022-02-25 DIAGNOSIS — Z923 Personal history of irradiation: Secondary | ICD-10-CM | POA: Insufficient documentation

## 2022-02-25 MED ORDER — MIRABEGRON ER 25 MG PO TB24: 25.0000 mg | ORAL_TABLET | Freq: Every day | ORAL | 6 refills | Status: DC

## 2022-02-25 MED ORDER — TAMSULOSIN HCL 0.4 MG PO CAPS: 0.4000 mg | ORAL_CAPSULE | Freq: Two times a day (BID) | ORAL | 12 refills | Status: DC

## 2022-02-25 NOTE — Progress Notes (Signed)
Radiation Oncology ?Follow up Note ? ?Name: Johnny Maddox   ?Date:   02/25/2022 ?MRN:  [id number] ?DOB: Apr 15, 1956  ? ? ?This 66 y.o. male presents to the clinic today for 1 month follow-up status post I-125 interstitial implant for Gleason 7 (4+3) adenocarcinoma the prostate presenting with a PSA of 7.7.. ? ?REFERRING PROVIDER: Velna Hatchet, MD ? ?HPI: Patient is a 66 year old male now out 1 month having completed I-125 interstitial implant for stage IIc Gleason 7 adenocarcinoma the prostate.  He is been having significant urinary side effects including frequency urgency slight incontinence and marked marked nocturia.  He has been taking Flomax once a day.  He is not been seen by urology.  He is having no diarrhea or any other further side effects.  We performed CT scan showing excellent source placement today for quality assurance.. ? ?COMPLICATIONS OF TREATMENT: none ? ?FOLLOW UP COMPLIANCE: keeps appointments  ? ?PHYSICAL EXAM:  ?BP (!) 154/86 (BP Location: Left Arm)   Pulse (!) 104   Temp (!) 97.2 ?F (36.2 ?C) (Tympanic)   Resp 18   Ht '5\' 10"'$  (1.778 m)   Wt 293 lb 11.2 oz (133.2 kg)   BMI 42.14 kg/m?  ?Well-developed well-nourished patient in NAD. HEENT reveals PERLA, EOMI, discs not visualized.  Oral cavity is clear. No oral mucosal lesions are identified. Neck is clear without evidence of cervical or supraclavicular adenopathy. Lungs are clear to A&P. Cardiac examination is essentially unremarkable with regular rate and rhythm without murmur rub or thrill. Abdomen is benign with no organomegaly or masses noted. Motor sensory and DTR levels are equal and symmetric in the upper and lower extremities. Cranial nerves II through XII are grossly intact. Proprioception is intact. No peripheral adenopathy or edema is identified. No motor or sensory levels are noted. Crude visual fields are within normal range. ? ?RADIOLOGY RESULTS: CT scan for quality assurance reviewed showing excellent source placement.  Full  QA will be performed ? ?PLAN: At this time I am starting the patient on Mybetric as well as include increasing his Flomax to twice a day.  Of also starting him on Azo as directed.  I have assured him his implant is a pretty much full strength at this time and the side effects will diminish once the implant is complete with its radiation effects in about a month's time.  I have asked to see him back in 3 months with a repeat PSA at that time.  Should his symptoms worsen I have encouraged him to contact urology. ? ?I would like to take this opportunity to thank you for allowing me to participate in the care of your patient.. ?  ? Noreene Filbert, MD ? ?

## 2022-02-26 DIAGNOSIS — C61 Malignant neoplasm of prostate: Secondary | ICD-10-CM | POA: Diagnosis not present

## 2022-02-27 DIAGNOSIS — C61 Malignant neoplasm of prostate: Secondary | ICD-10-CM | POA: Diagnosis not present

## 2022-03-05 NOTE — Progress Notes (Signed)
? ? ?03/06/2022 ?4:14 PM  ? ?Johnny Maddox ?03/03/1956 ?[id number] ? ?Referring provider: Velna Hatchet, MD ?[address] ?[place],  [place] [address] ? ?Chief Complaint  ?Patient presents with  ? Urinary Frequency  ? Benign Prostatic Hypertrophy  ? Prostate Cancer  ? ?Urological history: ?1. Prostate cancer ?-Intermittent unfavorable risk prostate cancer ?-s/p I-125 prostate seeds implantation 01/28/2022 ?-Has follow-up with Dr. Donella Stade in July ? ?2. ED ?-Contributing factors of age, BPH, prostate cancer, radioactive seed placement, diabetes, hypertension, hyperlipidemia, coronary artery disease and sleep apnea ?-Managed with sildenafil 100 mg on demand dosing ? ?3. BPH with LU TS ?-I PSS 26/6 ?-PVR 104 mL  ? ?4.  Left testicular mass ?-scrotal ultrasound 08/2020 Stable 4 mm nodule in the left testicle. This is favored to be benign given its 3 year stability ? ?HPI: ?Johnny Maddox is a 66 y.o. male who presents today for frequent urination. ? ?He states since the brachii seeds were placed he has been having frequency with 10 trips to the bathroom daily and nocturia on the hours.  He has a very urgent need to urinate with some urinary hesitancy and urge incontinence.  He states he is wearing depends in case he cannot make it to the restroom on time. ? ?He has been taking tamsulosin 0.4 mg twice daily and was given Myrbetriq 25 mg when he saw Dr. Donella Stade last week.  He has noted some relief with the Myrbetriq. ? ?UA benign ? ?Bladder scan 104 mL ? ?PMH: ?Past Medical History:  ?Diagnosis Date  ? Acute laryngitis, without mention of obstruction   ? Allergic rhinitis, cause unspecified   ? Chalazion   ? Cough   ? Dermatophytosis of foot   ? Diabetes mellitus without complication ([place])   ? ED (erectile dysfunction)   ? Elevated PSA   ? Hyperlipidemia   ? Hypertension   ? Microalbuminuria   ? Microalbuminuria   ? Overweight   ? Prostate cancer ([place])   ? Sarcoidosis   ? Seborrheic dermatitis, unspecified   ? Sleep apnea   ?  Unspecified disorder of autonomic nervous system   ? Unspecified sleep apnea   ? Unspecified venous (peripheral) insufficiency   ? Unspecified vitamin D deficiency   ? ? ?Surgical History: ?Past Surgical History:  ?Procedure Laterality Date  ? CATARACT EXTRACTION Bilateral   ? CIRCUMCISION    ? HYDROCELE EXCISION Right 09/23/2016  ? Procedure: HYDROCELECTOMY ADULT ;  Surgeon: Hollice Espy, MD;  Location: [facility];  Service: Urology;  Laterality: Right;  INGUINAL APPROACH  ? LEFT HEART CATH AND CORONARY ANGIOGRAPHY N/A 10/02/2021  ? Procedure: LEFT HEART CATH AND CORONARY ANGIOGRAPHY;  Surgeon: Nigel Mormon, MD;  Location: [facility];  Service: Cardiovascular;  Laterality: N/A;  ? RADIOACTIVE SEED IMPLANT N/A 01/28/2022  ? Procedure: RADIOACTIVE SEED IMPLANT/BRACHYTHERAPY IMPLANT;  Surgeon: Hollice Espy, MD;  Location: [facility];  Service: Urology;  Laterality: N/A;  88 seeds 28 needles  ? ROTATOR CUFF REPAIR  R 2011, L 2006  ? L and R shoulder  ? [place]  ? WISDOM TOOTH EXTRACTION    ? x4  ? ? ?Home Medications:  ?Allergies as of 03/06/2022   ? ?   Reactions  ? Other Swelling  ? Mushrooms-lip swelling  ? Lisinopril Cough  ? [place] Nut (berthollefia [place]) Skin Test Rash  ? Allergic to [place] nut  ? ?  ? ?  ?Medication List  ?  ? ?  ?  Accurate as of March 06, 2022 11:59 PM. If you have any questions, ask your nurse or doctor.  ?  ?  ? ?  ? ?STOP taking these medications   ? ?mirabegron ER 25 MG Tb24 tablet ?Commonly known as: MYRBETRIQ ?Stopped by: Zara Council, PA-C ?  ? ?  ? ?TAKE these medications   ? ?aspirin EC 81 MG tablet ?Take 81 mg by mouth in the morning. Swallow whole. ?  ?atorvastatin 40 MG tablet ?Commonly known as: LIPITOR ?Take 40 mg by mouth in the morning. ?  ?B-D ULTRAFINE III SHORT PEN 31G X 8 MM Misc ?Generic drug: Insulin Pen Needle ?Use 4 (four) times daily ?  ?Dulaglutide 1.5 MG/0.5ML Sopn ?Inject 1.5 mg into the skin once a week. Either  Saturdays or Sundays ?  ?finasteride 5 MG tablet ?Commonly known as: PROSCAR ?Take 1 tablet (5 mg total) by mouth daily. ?  ?FreeStyle Libre 14 Day Sensor Misc ?USE TO TEST BLOOD SUGAR, CHANGING EVERY 14 DAYS ?  ?FreeStyle Libre Reader Kerrin Mo ?1 Device by Other route 3 (three) times daily. E11.65 ?  ?furosemide 20 MG tablet ?Commonly known as: LASIX ?Take 20 mg by mouth daily as needed for edema. ?  ?Gemtesa 75 MG Tabs ?Generic drug: Vibegron ?Take 75 mg by mouth daily. ?Started by: Zara Council, PA-C ?  ?HumaLOG KwikPen 100 UNIT/ML KwikPen ?Generic drug: insulin lispro ?Inject 18 Units into the skin with breakfast, with lunch, and with evening meal. ?  ?HYDROcodone-acetaminophen 5-325 MG tablet ?Commonly known as: NORCO/VICODIN ?Take 1-2 tablets by mouth every 6 (six) hours as needed for moderate pain. ?  ?Lantus SoloStar 100 UNIT/ML Solostar Pen ?Generic drug: insulin glargine ?Inject 18 Units into the skin at bedtime. ?  ?losartan-hydrochlorothiazide 50-12.5 MG tablet ?Commonly known as: HYZAAR ?Take 1 tablet by mouth in the morning. ?  ?metFORMIN 500 MG 24 hr tablet ?Commonly known as: GLUCOPHAGE-XR ?Take 1,000 mg by mouth in the morning and at bedtime. ?  ?metoprolol succinate 50 MG 24 hr tablet ?Commonly known as: TOPROL-XL ?Take 1 tablet (50 mg total) by mouth daily. Take with or immediately following a meal. ?  ?multivitamin with minerals Tabs tablet ?Take 1 tablet by mouth in the morning. ?  ?omeprazole 20 MG capsule ?Commonly known as: PRILOSEC ?Take 1 capsule (20 mg total) by mouth daily. ?What changed:  ?when to take this ?reasons to take this ?  ?sildenafil 100 MG tablet ?Commonly known as: VIAGRA ?Take 1 tablet (100 mg total) by mouth daily as needed for erectile dysfunction. Take two hours prior to intercourse on an empty stomach ?  ?tadalafil 20 MG tablet ?Commonly known as: CIALIS ?Take 20 mg by mouth in the morning. ?  ?tamsulosin 0.4 MG Caps capsule ?Commonly known as: FLOMAX ?Take 1 capsule (0.4  mg total) by mouth 2 (two) times daily at 10 AM and 5 PM. ?  ? ?  ? ? ?Allergies:  ?Allergies  ?Allergen Reactions  ? Other Swelling  ?  Mushrooms-lip swelling  ? Lisinopril Cough  ? [place] Nut (Berthollefia [place]) Skin Test Rash  ?  Allergic to [place] nut  ? ? ?Family History: ?Family History  ?Problem Relation Age of Onset  ? Cancer Mother   ?     leukemia  ? Hypertension Mother   ? Alcohol abuse Father   ? Other Father   ?     Father was Murdered @ age 59  ? Hypertension Brother   ? Cancer - Lung Brother   ?  Stroke Brother   ? Prostate cancer Neg Hx   ? Bladder Cancer Neg Hx   ? Kidney disease Neg Hx   ? Kidney cancer Neg Hx   ? ? ?Social History:  reports that he has quit smoking. His smoking use included cigarettes. He has never used smokeless tobacco. He reports current alcohol use of about 2.0 standard drinks per week. He reports that he does not use drugs. ? ?ROS: ?Pertinent ROS in HPI ? ?Physical Exam: ?BP 114/89   Pulse (!) 111   Ht '5\' 10"'$  (1.778 m)   Wt 285 lb (129.3 kg)   BMI 40.89 kg/m?   ?Constitutional:  Well nourished. Alert and oriented, No acute distress. ?HEENT: [place] AT, moist mucus membranes.  Trachea midline, no masses. ?Cardiovascular: No clubbing, cyanosis, or edema. ?Respiratory: Normal respiratory effort, no increased work of breathing. ?Neurologic: Grossly intact, no focal deficits, moving all 4 extremities. ?Psychiatric: Normal mood and affect. ? ?Laboratory Data: ?Lab Results  ?Component Value Date  ? WBC 4.2 01/22/2022  ? HGB 13.8 01/22/2022  ? HCT 44.4 01/22/2022  ? MCV 84.6 01/22/2022  ? PLT 137 (L) 01/22/2022  ? ? ?Lab Results  ?Component Value Date  ? CREATININE 1.05 09/27/2021  ? ?Lab Results  ?Component Value Date  ? AST 25 05/24/2021  ? ?Lab Results  ?Component Value Date  ? ALT 22 05/24/2021  ? ?Urinalysis ?Component ?    Latest Ref Rng 03/06/2022  ?Color, UA ?    Yellow  Yellow   ?Bilirubin, UA ?    Negative  Negative   ?Ketones, UA ?    Negative  Negative   ?Specific  Gravity, UA ?    1.005 - 1.030  1.015   ?RBC, UA ?    Negative  Negative   ?pH, UA ?    5.0 - 7.5  5.5   ?Protein,UA ?    Negative/Trace  Trace !   ?Nitrite, UA ?    Negative  Negative   ?Leukocytes,UA ?    Negativ

## 2022-03-06 ENCOUNTER — Ambulatory Visit (INDEPENDENT_AMBULATORY_CARE_PROVIDER_SITE_OTHER): Payer: Managed Care, Other (non HMO) | Admitting: Urology

## 2022-03-06 ENCOUNTER — Encounter: Payer: Self-pay | Admitting: Urology

## 2022-03-06 VITALS — BP 114/89 | HR 111 | Ht 70.0 in | Wt 285.0 lb

## 2022-03-06 DIAGNOSIS — R3915 Urgency of urination: Secondary | ICD-10-CM | POA: Diagnosis not present

## 2022-03-06 DIAGNOSIS — N401 Enlarged prostate with lower urinary tract symptoms: Secondary | ICD-10-CM | POA: Diagnosis not present

## 2022-03-06 DIAGNOSIS — C61 Malignant neoplasm of prostate: Secondary | ICD-10-CM

## 2022-03-06 DIAGNOSIS — N138 Other obstructive and reflux uropathy: Secondary | ICD-10-CM

## 2022-03-06 LAB — URINALYSIS, COMPLETE
Bilirubin, UA: NEGATIVE
Ketones, UA: NEGATIVE
Leukocytes,UA: NEGATIVE
Nitrite, UA: NEGATIVE
RBC, UA: NEGATIVE
Specific Gravity, UA: 1.015 (ref 1.005–1.030)
Urobilinogen, Ur: 0.2 mg/dL (ref 0.2–1.0)
pH, UA: 5.5 (ref 5.0–7.5)

## 2022-03-06 LAB — MICROSCOPIC EXAMINATION: Bacteria, UA: NONE SEEN

## 2022-03-06 LAB — BLADDER SCAN AMB NON-IMAGING

## 2022-03-06 MED ORDER — GEMTESA 75 MG PO TABS: 75.0000 mg | ORAL_TABLET | Freq: Every day | ORAL | 0 refills | Status: DC

## 2022-03-09 LAB — CULTURE, URINE COMPREHENSIVE

## 2022-03-13 ENCOUNTER — Telehealth: Payer: Self-pay

## 2022-03-13 NOTE — Telephone Encounter (Signed)
-----   Message from Nori Riis, PA-C sent at 03/12/2022  8:23 AM EDT ----- ?Please let Mr. Smola know that his urine culture was negative for infection.   ?

## 2022-03-13 NOTE — Telephone Encounter (Signed)
Notified pt as advised, pt expressed understanding.  ?

## 2022-03-13 NOTE — Telephone Encounter (Signed)
Pt approved for Gemtesa 03/07/2022 - 03/08/2023. [id number]. ?

## 2022-03-28 NOTE — Progress Notes (Signed)
03/29/2022 9:24 AM   Johnny Maddox Sep 25, 1956 [id number]  Referring provider: Velna Hatchet, [place],  [place] [place] [address]  Urological history: 1. Prostate cancer -Intermittent unfavorable risk prostate cancer -s/p I-125 prostate seeds implantation 01/28/2022 -Has follow-up with Dr. Donella Stade in July  2. ED -Contributing factors of age, BPH, prostate cancer, radioactive seed placement, diabetes, hypertension, hyperlipidemia, coronary artery disease and sleep apnea -Managed with sildenafil 100 mg on demand dosing  3. BPH with LU TS -I PSS 16/3 -PVR 45 mL   4.  Left testicular mass -scrotal ultrasound 08/2020 Stable 4 mm nodule in the left testicle. This is favored to be benign given its 3 year stability   Benign Prostatic Hypertrophy and Prostate Cancer   HPI: Johnny Maddox is a 66 y.o. male who presents today for 3 week follow up.   He presented 3 weeks ago complaining of frequency, nocturia, urgency, urinary hesitancy and urge incontinence.  He was experiencing incontinence and wearing depends for management.  He has been taking tamsulosin 0.4 mg twice daily and had been on a weeks worth of Myrbetriq 25 mg daily.  He was not at goal with this regimen.  His UA was benign and his urine culture grew out MUF.  His IPSS score was 26/6 and his PVR was 94 mL.  The Myrbetriq was exchanged for Gemtesa 75 mg samples and he is returning today for follow-up.  He has circled frequent urination and urgent urination on his ROS sheet.   He states that Johnny Maddox has decreased his frequency and urgency.  He states he can manage during the day by reducing fluids and taking the medication.  His nocturia has decreased from every hour to 2 times a night.  Patient denies any modifying or aggravating factors.  Patient denies any gross hematuria, dysuria or suprapubic/flank pain.  Patient denies any fevers, chills, nausea or vomiting.    He continues to have issues with no libido,  fatigue and joint achiness.  He states he is taking the calcium and the vitamin D.  He does take ibuprofen for the joints.   IPSS     Row Name 03/29/22 0800         International Prostate Symptom Score   How often have you had the sensation of not emptying your bladder? About half the time     How often have you had to urinate less than every two hours? About half the time     How often have you found you stopped and started again several times when you urinated? Not at All     How often have you found it difficult to postpone urination? Almost always     How often have you had a weak urinary stream? Not at All     How often have you had to strain to start urination? Less than half the time     How many times did you typically get up at night to urinate? 3 Times     Total IPSS Score 16       Quality of Life due to urinary symptoms   If you were to spend the rest of your life with your urinary condition just the way it is now how would you feel about that? Mixed              Score:  1-7 Mild 8-19 Moderate 20-35 Severe   PMH: Past Medical History:  Diagnosis Date   Acute laryngitis, without  mention of obstruction    Allergic rhinitis, cause unspecified    Chalazion    Cough    Dermatophytosis of foot    Diabetes mellitus without complication ([place])    ED (erectile dysfunction)    Elevated PSA    Hyperlipidemia    Hypertension    Microalbuminuria    Microalbuminuria    Overweight    Prostate cancer ([place])    Sarcoidosis    Seborrheic dermatitis, unspecified    Sleep apnea    Unspecified disorder of autonomic nervous system    Unspecified sleep apnea    Unspecified venous (peripheral) insufficiency    Unspecified vitamin D deficiency     Surgical History: Past Surgical History:  Procedure Laterality Date   CATARACT EXTRACTION Bilateral    CIRCUMCISION     HYDROCELE EXCISION Right 09/23/2016   Procedure: HYDROCELECTOMY ADULT ;  Surgeon: Hollice Espy, MD;   Location: [facility];  Service: Urology;  Laterality: Right;  INGUINAL APPROACH   LEFT HEART CATH AND CORONARY ANGIOGRAPHY N/A 10/02/2021   Procedure: LEFT HEART CATH AND CORONARY ANGIOGRAPHY;  Surgeon: Nigel Mormon, MD;  Location: [facility];  Service: Cardiovascular;  Laterality: N/A;   RADIOACTIVE SEED IMPLANT N/A 01/28/2022   Procedure: RADIOACTIVE SEED IMPLANT/BRACHYTHERAPY IMPLANT;  Surgeon: Hollice Espy, MD;  Location: [facility];  Service: Urology;  Laterality: N/A;  88 seeds 28 needles   ROTATOR CUFF REPAIR  R 2011, L 2006   L and R shoulder   TONSILLECTOMY AND ADENOIDECTOMY  1964   WISDOM TOOTH EXTRACTION     x4    Home Medications:  Allergies as of 03/29/2022       Reactions   Other Swelling   Mushrooms-lip swelling   Lisinopril Cough   [place] Nut (berthollefia Excelsa) Skin Test Rash   Allergic to [place] nut        Medication List        Accurate as of Mar 29, 2022  9:24 AM. If you have any questions, ask your nurse or doctor.          aspirin EC 81 MG tablet Take 81 mg by mouth in the morning. Swallow whole.   atorvastatin 40 MG tablet Commonly known as: LIPITOR Take 40 mg by mouth in the morning.   B-D ULTRAFINE III SHORT PEN 31G X 8 MM Misc Generic drug: Insulin Pen Needle Use 4 (four) times daily   Dulaglutide 1.5 MG/0.5ML Sopn Inject 1.5 mg into the skin once a week. Either Saturdays or Sundays   finasteride 5 MG tablet Commonly known as: PROSCAR Take 1 tablet (5 mg total) by mouth daily.   FreeStyle Libre 14 Day Sensor Misc USE TO TEST BLOOD SUGAR, CHANGING EVERY 14 DAYS   FreeStyle Libre Reader Devi 1 Device by Other route 3 (three) times daily. E11.65   furosemide 20 MG tablet Commonly known as: LASIX Take 20 mg by mouth daily as needed for edema.   Gemtesa 75 MG Tabs Generic drug: Vibegron Take 75 mg by mouth daily.   HumaLOG KwikPen 100 UNIT/ML KwikPen Generic drug: insulin lispro Inject 18 Units into the skin  with breakfast, with lunch, and with evening meal.   HYDROcodone-acetaminophen 5-325 MG tablet Commonly known as: NORCO/VICODIN Take 1-2 tablets by mouth every 6 (six) hours as needed for moderate pain.   Lantus SoloStar 100 UNIT/ML Solostar Pen Generic drug: insulin glargine Inject 18 Units into the skin at bedtime.   losartan-hydrochlorothiazide 50-12.5 MG tablet Commonly known as:  HYZAAR Take 1 tablet by mouth in the morning.   metFORMIN 500 MG 24 hr tablet Commonly known as: GLUCOPHAGE-XR Take 1,000 mg by mouth in the morning and at bedtime.   metoprolol succinate 50 MG 24 hr tablet Commonly known as: TOPROL-XL Take 1 tablet (50 mg total) by mouth daily. Take with or immediately following a meal.   multivitamin with minerals Tabs tablet Take 1 tablet by mouth in the morning.   omeprazole 20 MG capsule Commonly known as: PRILOSEC Take 1 capsule (20 mg total) by mouth daily.   sildenafil 100 MG tablet Commonly known as: VIAGRA Take 1 tablet (100 mg total) by mouth daily as needed for erectile dysfunction. Take two hours prior to intercourse on an empty stomach   tadalafil 20 MG tablet Commonly known as: CIALIS Take 20 mg by mouth in the morning.   tamsulosin 0.4 MG Caps capsule Commonly known as: FLOMAX Take 1 capsule (0.4 mg total) by mouth 2 (two) times daily at 10 AM and 5 PM.        Allergies:  Allergies  Allergen Reactions   Other Swelling    Mushrooms-lip swelling   Lisinopril Cough   [place] Nut Daisey Must) Skin Test Rash    Allergic to [place] nut    Family History: Family History  Problem Relation Age of Onset   Cancer Mother        leukemia   Hypertension Mother    Alcohol abuse Father    Other Father        Father was Murdered @ age 69   Hypertension Brother    Cancer - Lung Brother    Stroke Brother    Prostate cancer Neg Hx    Bladder Cancer Neg Hx    Kidney disease Neg Hx    Kidney cancer Neg Hx     Social History:   reports that he has quit smoking. His smoking use included cigarettes. He has never used smokeless tobacco. He reports current alcohol use of about 2.0 standard drinks per week. He reports that he does not use drugs.  ROS: Pertinent ROS in HPI  Physical Exam: BP 133/84   Pulse 98   Ht '5\' 10"'$  (1.778 m)   Wt 285 lb (129.3 kg)   BMI 40.89 kg/m   Constitutional:  Well nourished. Alert and oriented, No acute distress. HEENT: [place] AT, moist mucus membranes.  Trachea midline Cardiovascular: No clubbing, cyanosis, or edema. Respiratory: Normal respiratory effort, no increased work of breathing. Neurologic: Grossly intact, no focal deficits, moving all 4 extremities. Psychiatric: Normal mood and affect.  Laboratory Data: Component 3 wk ago  Urine Culture, Comprehensive Final report   Organism ID, Bacteria Comment   Comment: Mixed urogenital flora  10,000-25,000 colony forming units per mL   Resulting Agency [facility]       Narrative Performed by: Maryan Puls Performed at:  [facility]  [address], [place], [place]  [id number]  Lab Director: Rush Farmer MD, Phone:  [phone]    Specimen Collected: 03/06/22 14:54 Last Resulted: 03/09/22 16:36      I have reviewed the labs.   Pertinent Imaging:  03/29/22 08:45  Scan Result 34m    Assessment & Plan:    1. LU TS -Recent UA and urine cultures were negative -Significant improvement of L UTS with Gemtesa -He will continue Gemtesa 7 5 mg daily -Prescription sent to pharmacy  2.  Prostate cancer -Unfavorable intermittent prostate cancer -Advised patient to start glucosamine/chondroitin  and take vitamin B-12 for energy -Also advised patient to stop finasteride as it has a side effect of fatigue as well -He has upcoming appointments with Dr. Donella Stade in July  Return in about 8 months (around 11/29/2022) for IPSS, SHIM, PSA, PVR and exam.  These notes generated with voice recognition software. I apologize for  typographical errors.  Zara Council, PA-C  [facility] [address]  [place], [place] [address] [phone]   I spent 30 minutes on the day of the encounter to include pre-visit record review, face-to-face time with the patient, and post-visit ordering of tests.

## 2022-03-29 ENCOUNTER — Ambulatory Visit (INDEPENDENT_AMBULATORY_CARE_PROVIDER_SITE_OTHER): Payer: Managed Care, Other (non HMO) | Admitting: Urology

## 2022-03-29 ENCOUNTER — Encounter: Payer: Self-pay | Admitting: Urology

## 2022-03-29 VITALS — BP 133/84 | HR 98 | Ht 70.0 in | Wt 285.0 lb

## 2022-03-29 DIAGNOSIS — N401 Enlarged prostate with lower urinary tract symptoms: Secondary | ICD-10-CM

## 2022-03-29 DIAGNOSIS — C61 Malignant neoplasm of prostate: Secondary | ICD-10-CM | POA: Diagnosis not present

## 2022-03-29 DIAGNOSIS — R3915 Urgency of urination: Secondary | ICD-10-CM | POA: Diagnosis not present

## 2022-03-29 LAB — BLADDER SCAN AMB NON-IMAGING

## 2022-03-29 MED ORDER — GEMTESA 75 MG PO TABS: 75.0000 mg | ORAL_TABLET | Freq: Every day | ORAL | 3 refills | Status: DC

## 2022-04-05 ENCOUNTER — Ambulatory Visit: Payer: Managed Care, Other (non HMO) | Admitting: Urology

## 2022-04-23 ENCOUNTER — Other Ambulatory Visit: Payer: Managed Care, Other (non HMO)

## 2022-05-02 ENCOUNTER — Ambulatory Visit: Payer: Managed Care, Other (non HMO) | Admitting: Urology

## 2022-05-05 ENCOUNTER — Other Ambulatory Visit: Payer: Self-pay | Admitting: Urology

## 2022-05-05 DIAGNOSIS — R3915 Urgency of urination: Secondary | ICD-10-CM

## 2022-05-24 ENCOUNTER — Other Ambulatory Visit: Payer: Self-pay | Admitting: *Deleted

## 2022-05-24 DIAGNOSIS — C61 Malignant neoplasm of prostate: Secondary | ICD-10-CM

## 2022-05-27 ENCOUNTER — Ambulatory Visit: Payer: Managed Care, Other (non HMO) | Admitting: Cardiology

## 2022-05-27 ENCOUNTER — Inpatient Hospital Stay: Payer: Managed Care, Other (non HMO) | Attending: Radiation Oncology

## 2022-05-27 DIAGNOSIS — C61 Malignant neoplasm of prostate: Secondary | ICD-10-CM | POA: Diagnosis present

## 2022-05-27 LAB — PSA: Prostatic Specific Antigen: 0.1 ng/mL (ref 0.00–4.00)

## 2022-05-27 NOTE — Progress Notes (Deleted)
Patient referred by Velna Hatchet, MD for syncoope  Subjective:   Johnny Maddox, male    DOB: 21-Jul-1956, 66 y.o.   MRN: [id number]   No chief complaint on file.    HPI  66 y.o. African American male with hypertension, hyperlipidemia, uncontrolled type 2 DM, referred for evaluation of syncope  Patient is here for follow-up visit.  Discussed recent test results with the patient, details below.  Patient is tearful today, has been under a lot of stress after several deaths in the family.  He is currently seeing a therapist to help with grief counseling.  He is hoping to take some time off work and recover mentally and physically.  From cardiac standpoint, he does report at least 1 more episode of syncope during exertion while climbing up stairs.  He routinely has episodes of chest pain with exertion, improved with rest.  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 He has hypertension, type 2 DM, hyperlipidemia for at least 5 years. He has had a lot of stressors, both at home and work. Four weeks ago, patient has an episode of loss of consciousness while at work. Patient woke up that morning at 6 AM, as usual. He did not have any time to eat proper breakfast due to time constraints. He was at work, remembers getting up from his chair to go get a file. Next thing he remembers that he had a lot of people standing around him. He was confused on regaining consciousness, denies losing bowel or bladder tone. Patient was taken to [facility] ED. Trop HS was mildly elevated and flat at 22,20. No other significant abnormalities were noted.   He has not had any more episodes since then. Prior to this event, he has had dizziness, but never had syncope. At baseline, patient denies any chest pain, shortness of breath symptoms. He has leg edema, which improved with compression stockings.     Current Outpatient Medications:    aspirin EC 81 MG tablet, Take 81 mg by  mouth in the morning. Swallow whole., Disp: , Rfl:    atorvastatin (LIPITOR) 40 MG tablet, Take 40 mg by mouth in the morning., Disp: , Rfl:    Continuous Blood Gluc Receiver (FREESTYLE LIBRE READER) DEVI, 1 Device by Other route 3 (three) times daily. E11.65, Disp: 1 each, Rfl: 0   Continuous Blood Gluc Sensor (FREESTYLE LIBRE 14 DAY SENSOR) MISC, USE TO TEST BLOOD SUGAR, CHANGING EVERY 14 DAYS, Disp: , Rfl: 12   Dulaglutide 1.5 MG/0.5ML SOPN, Inject 1.5 mg into the skin once a week. Either Saturdays or Sundays, Disp: , Rfl:    finasteride (PROSCAR) 5 MG tablet, Take 1 tablet (5 mg total) by mouth daily., Disp: 90 tablet, Rfl: 3   furosemide (LASIX) 20 MG tablet, Take 20 mg by mouth daily as needed for edema. (Patient not taking: Reported on 03/29/2022), Disp: , Rfl:    GEMTESA 75 MG TABS, TAKE 1 TABLET BY MOUTH DAILY, Disp: 30 tablet, Rfl: 3   HUMALOG KWIKPEN 100 UNIT/ML KiwkPen, Inject 18 Units into the skin with breakfast, with lunch, and with evening meal., Disp: , Rfl: 12   HYDROcodone-acetaminophen (NORCO/VICODIN) 5-325 MG tablet, Take 1-2 tablets by mouth every 6 (six) hours as needed for moderate pain. (Patient not taking: Reported on 02/25/2022), Disp: 10 tablet, Rfl: 0   Insulin Glargine (LANTUS SOLOSTAR) 100 UNIT/ML Solostar Pen, Inject 18 Units into the skin at bedtime., Disp: , Rfl:  Insulin Pen Needle (B-D ULTRAFINE III SHORT PEN) 31G X 8 MM MISC, Use 4 (four) times daily, Disp: , Rfl:    losartan-hydrochlorothiazide (HYZAAR) 50-12.5 MG tablet, Take 1 tablet by mouth in the morning. (Patient not taking: Reported on 03/29/2022), Disp: , Rfl:    metFORMIN (GLUCOPHAGE-XR) 500 MG 24 hr tablet, Take 1,000 mg by mouth in the morning and at bedtime., Disp: , Rfl: 5   metoprolol succinate (TOPROL-XL) 50 MG 24 hr tablet, Take 1 tablet (50 mg total) by mouth daily. Take with or immediately following a meal. (Patient not taking: Reported on 12/31/2021), Disp: 30 tablet, Rfl: 3   Multiple Vitamin  (MULTIVITAMIN WITH MINERALS) TABS tablet, Take 1 tablet by mouth in the morning., Disp: , Rfl:    omeprazole (PRILOSEC) 20 MG capsule, Take 1 capsule (20 mg total) by mouth daily. (Patient not taking: Reported on 03/29/2022), Disp: 30 capsule, Rfl: 5   sildenafil (VIAGRA) 100 MG tablet, Take 1 tablet (100 mg total) by mouth daily as needed for erectile dysfunction. Take two hours prior to intercourse on an empty stomach (Patient not taking: Reported on 03/29/2022), Disp: 30 tablet, Rfl: 3   tadalafil (CIALIS) 20 MG tablet, Take 20 mg by mouth in the morning. (Patient not taking: Reported on 03/29/2022), Disp: , Rfl:    tamsulosin (FLOMAX) 0.4 MG CAPS capsule, Take 1 capsule (0.4 mg total) by mouth 2 (two) times daily at 10 AM and 5 PM., Disp: 60 capsule, Rfl: 12    Cardiovascular and other pertinent studies:  ***  Mobile cardiac telemetry 4 days 09/24/2021 - 09/28/2021: Dominant rhythm: Sinus. HR 65-137 bpm. Avg HR 102 bpm. 0 episodes of SVT. <1% isolated SVE, couplets 0 episodes of VT. <1% isolated VE, couplets No atrial fibrillation/atrial flutter/SVT/VT/high grade AV block, sinus pause >3sec noted. 0 patient triggered events.   Coronary angiography 09/2021: LM: Normal LAD: Mid 30% stenosis Lcx: Normal RCA: Normal   LVEDP 25 mmHg  Exercise Tetrofosmin stress test 09/14/2021: Exercise nuclear stress test was performed using Bruce protocol. Patient reached 4.6 METS, and 90% of age predicted maximum heart rate. Exercise capacity was low. No chest pain reported, dyspnea reported. Heart rate and hemodynamic response were normal. Stress EKG revealed no ischemic changes. SPECT images show medium sized, medium intensity, partially reversible perfusion defect in apical inferior, apical to basal inferoseptal myocardium, along with global decrease in myocardial thickening and wall motion. Stress LVEF 30%.  High risk study.   Echocardiogram 08/29/2021:  Left ventricle cavity is normal in size  and wall thickness. Mild global  hypokinesis. LVEF 45-50%. Indeterminate diastolic filling pattern.  Structurally normal trileaflet aortic valve. No evidence of aortic  stenosis. Mild (Grade I) aortic regurgitation.  Mild (Grade I) mitral regurgitation.  Normal right atrial pressure.  EKG 06/26/2021: Sinus rhythm 86 bpm  Left ventricular hypertrophy IVCD Nonspecific T-abnormality   Recent labs: 05/24/2021: Glucose 167, BUN/Cr 12/0.88. EGFR >60. Na/K 138/36. Rest of the CMP normal H/H 13.5/4.3. MCV 83.4. Platelets 123 Trop HS 22, 20   10/2020: HbA1C 11.3%   Review of Systems  Cardiovascular:  Positive for chest pain, leg swelling and syncope. Negative for dyspnea on exertion and palpitations.         There were no vitals filed for this visit.  There is no height or weight on file to calculate BMI. There were no vitals filed for this visit.    Objective:   Physical Exam Vitals and nursing note reviewed.  Constitutional:  General: He is not in acute distress. Neck:     Vascular: No JVD.  Cardiovascular:     Rate and Rhythm: Normal rate and regular rhythm.     Heart sounds: Normal heart sounds. No murmur heard. Pulmonary:     Effort: Pulmonary effort is normal.     Breath sounds: Normal breath sounds. No wheezing or rales.  Abdominal:     Palpations: There is no mass.  Musculoskeletal:     Right lower leg: Edema (1+) present.     Left lower leg: Edema (1+) present.  Skin:    Comments: Discoloration and scarring bilateral legs medial aspect, stasis dermatitis          Assessment & Recommendations:   66 y.o. African American male with hypertension, hyperlipidemia, uncontrolled type 2 DM, referred for evaluation of syncope  Syncope: Placed on light cardiac telemetry today. Stress test did not show high risk findings, as above.  Keeping in mind his angina symptoms, ischemia, especially if left main or proximal LAD, could be a potential cause for  syncope. Recommend metoprolol succinate 25 mg daily, Imdur 30 mg daily. Continue aspirin and statin. Recommend coronary angiography and possible intervention.  Given upcoming cardiac work-up scheduled, consider time off from work.  I encouraged him to talk to his therapist in order to assist with short-term disability while to manage his grief/depression.  F/u after cardiac catheterization.   Nigel Mormon, MD Pager: [phone] Office: [phone]

## 2022-05-30 ENCOUNTER — Ambulatory Visit: Payer: Managed Care, Other (non HMO) | Admitting: Cardiology

## 2022-06-03 ENCOUNTER — Ambulatory Visit
Admission: RE | Admit: 2022-06-03 | Discharge: 2022-06-03 | Disposition: A | Discharge status: Home / Self Care | Payer: Managed Care, Other (non HMO) | Source: Ambulatory Visit | Attending: Radiation Oncology | Admitting: Radiation Oncology

## 2022-06-03 ENCOUNTER — Encounter: Payer: Self-pay | Admitting: Radiation Oncology

## 2022-06-03 VITALS — BP 136/91 | HR 96 | Temp 97.4°F | Resp 17 | Ht 70.0 in

## 2022-06-03 DIAGNOSIS — C61 Malignant neoplasm of prostate: Secondary | ICD-10-CM | POA: Diagnosis present

## 2022-06-03 DIAGNOSIS — Z923 Personal history of irradiation: Secondary | ICD-10-CM | POA: Diagnosis not present

## 2022-06-03 NOTE — Progress Notes (Signed)
Radiation Oncology Follow up Note  Name: Johnny Maddox   Date:   06/03/2022 MRN:  [id number] DOB: [birth year]    This 66 y.o. male presents to the clinic today for 43-monthfollow-up status post I-125 interstitial implant for Gleason 7 (4+3) adenocarcinoma of the prostate presenting with a PSA of 7.7..  REFERRING PROVIDER: HVelna Hatchet MD  HPI: Patient is a 66year old male now out 4 months having completed I-125 interstitial implant for Gleason 7 (4+3) adenocarcinoma presenting with a PSA of 7.7 seen today in routine follow-up he is doing well specifically denies any increased lower urinary tract symptoms diarrhea or fatigue his most recent PSA is 0.10..Marland Kitchen COMPLICATIONS OF TREATMENT: none  FOLLOW UP COMPLIANCE: keeps appointments   PHYSICAL EXAM:  BP (!) 136/91   Pulse 96   Temp (!) 97.4 F (36.3 C)   Resp 17   Ht '5\' 10"'$  (1.778 m)   BMI 40.89 kg/m  Well-developed well-nourished patient in NAD. HEENT reveals PERLA, EOMI, discs not visualized.  Oral cavity is clear. No oral mucosal lesions are identified. Neck is clear without evidence of cervical or supraclavicular adenopathy. Lungs are clear to A&P. Cardiac examination is essentially unremarkable with regular rate and rhythm without murmur rub or thrill. Abdomen is benign with no organomegaly or masses noted. Motor sensory and DTR levels are equal and symmetric in the upper and lower extremities. Cranial nerves II through XII are grossly intact. Proprioception is intact. No peripheral adenopathy or edema is identified. No motor or sensory levels are noted. Crude visual fields are within normal range.  RADIOLOGY RESULTS: No current films for review  PLAN: Present time patient is under excellent biochemical control of his prostate cancer.  He has very low side effect profile.  I am pleased with his overall progress have asked to see him back in 6 months for follow-up.  Patient knows to call with any concerns.  I would like to take this  opportunity to thank you for allowing me to participate in the care of your patient..Noreene Filbert MD

## 2022-06-12 ENCOUNTER — Encounter: Payer: Self-pay | Admitting: Cardiology

## 2022-06-12 ENCOUNTER — Ambulatory Visit: Payer: Managed Care, Other (non HMO) | Admitting: Cardiology

## 2022-06-12 VITALS — BP 150/83 | HR 94 | Temp 98.0°F | Resp 16 | Ht 70.0 in | Wt 299.0 lb

## 2022-06-12 DIAGNOSIS — R55 Syncope and collapse: Secondary | ICD-10-CM

## 2022-06-12 DIAGNOSIS — I5020 Unspecified systolic (congestive) heart failure: Secondary | ICD-10-CM

## 2022-06-12 DIAGNOSIS — R600 Localized edema: Secondary | ICD-10-CM

## 2022-06-12 MED ORDER — EMPAGLIFLOZIN 10 MG PO TABS: 10.0000 mg | ORAL_TABLET | Freq: Every day | ORAL | 3 refills | Status: DC

## 2022-06-12 NOTE — Progress Notes (Signed)
Patient referred by Velna Hatchet, MD for syncoope  Subjective:   Johnny Maddox, male    DOB: 17-Jun-1956, 66 y.o.   MRN: [id number]   No chief complaint on file.    HPI  66 y.o. African American male with hypertension, hyperlipidemia, uncontrolled type 2 DM, syncope, leg edema  Syncope workup was largely unremarkable in 2022, except mildly reduced LVEF. Patient is now referred back to due to leg edema.   Recently, patient is experienced bilateral leg edema.  Blood pressure is elevated.  He denies any overt exertional dyspnea, orthopnea, PND symptoms.  He also undergoing treatment for prostate cancer.  PSA is well-controlled at 0.1.   Current Outpatient Medications:    aspirin EC 81 MG tablet, Take 81 mg by mouth in the morning. Swallow whole., Disp: , Rfl:    atorvastatin (LIPITOR) 40 MG tablet, Take 40 mg by mouth in the morning., Disp: , Rfl:    Continuous Blood Gluc Receiver (FREESTYLE LIBRE READER) DEVI, 1 Device by Other route 3 (three) times daily. E11.65, Disp: 1 each, Rfl: 0   Continuous Blood Gluc Sensor (FREESTYLE LIBRE 14 DAY SENSOR) MISC, USE TO TEST BLOOD SUGAR, CHANGING EVERY 14 DAYS, Disp: , Rfl: 12   Dulaglutide 1.5 MG/0.5ML SOPN, Inject 1.5 mg into the skin once a week. Either Saturdays or Sundays, Disp: , Rfl:    finasteride (PROSCAR) 5 MG tablet, Take 1 tablet (5 mg total) by mouth daily., Disp: 90 tablet, Rfl: 3   furosemide (LASIX) 20 MG tablet, Take 20 mg by mouth daily as needed for edema., Disp: , Rfl:    GEMTESA 75 MG TABS, TAKE 1 TABLET BY MOUTH DAILY, Disp: 30 tablet, Rfl: 3   HUMALOG KWIKPEN 100 UNIT/ML KiwkPen, Inject 18 Units into the skin with breakfast, with lunch, and with evening meal., Disp: , Rfl: 12   HYDROcodone-acetaminophen (NORCO/VICODIN) 5-325 MG tablet, Take 1-2 tablets by mouth every 6 (six) hours as needed for moderate pain., Disp: 10 tablet, Rfl: 0   Insulin Glargine (LANTUS SOLOSTAR) 100 UNIT/ML Solostar Pen, Inject 18 Units into the  skin at bedtime., Disp: , Rfl:    Insulin Pen Needle (B-D ULTRAFINE III SHORT PEN) 31G X 8 MM MISC, Use 4 (four) times daily, Disp: , Rfl:    losartan-hydrochlorothiazide (HYZAAR) 50-12.5 MG tablet, Take 1 tablet by mouth in the morning., Disp: , Rfl:    metFORMIN (GLUCOPHAGE-XR) 500 MG 24 hr tablet, Take 1,000 mg by mouth in the morning and at bedtime., Disp: , Rfl: 5   metoprolol succinate (TOPROL-XL) 50 MG 24 hr tablet, Take 1 tablet (50 mg total) by mouth daily. Take with or immediately following a meal., Disp: 30 tablet, Rfl: 3   Multiple Vitamin (MULTIVITAMIN WITH MINERALS) TABS tablet, Take 1 tablet by mouth in the morning., Disp: , Rfl:    omeprazole (PRILOSEC) 20 MG capsule, Take 1 capsule (20 mg total) by mouth daily., Disp: 30 capsule, Rfl: 5   tamsulosin (FLOMAX) 0.4 MG CAPS capsule, Take 1 capsule (0.4 mg total) by mouth 2 (two) times daily at 10 AM and 5 PM., Disp: 60 capsule, Rfl: 12  Cardiovascular and other pertinent studies:  EKG 06/12/2022: Sinus tachycardia 104 bpm  Right bundle branch block  Left anterior fascicular block Nonspecific T-abnormality  Coronary angiography 09/2021: LM: Normal LAD: Mid 30% stenosis Lcx: Normal RCA: Normal   LVEDP 25 mmHg  Mobile cardiac telemetry 4 days 09/24/2021 - 09/28/2021: Dominant rhythm: Sinus. HR 65-137 bpm. Avg HR  102 bpm. 0 episodes of SVT. <1% isolated SVE, couplets 0 episodes of VT. <1% isolated VE, couplets No atrial fibrillation/atrial flutter/SVT/VT/high grade AV block, sinus pause >3sec noted. 0 patient triggered events.      Echocardiogram 08/29/2021:  Left ventricle cavity is normal in size and wall thickness. Mild global  hypokinesis. LVEF 45-50%. Indeterminate diastolic filling pattern.  Structurally normal trileaflet aortic valve. No evidence of aortic  stenosis. Mild (Grade I) aortic regurgitation.  Mild (Grade I) mitral regurgitation.  Normal right atrial pressure.  EKG 06/26/2021: Sinus rhythm 86 bpm   Left ventricular hypertrophy IVCD Nonspecific T-abnormality   Recent labs: 01/22/2022: H/H 13/44. MCV 84. Platelets 137  09/27/2021: Glucose 208, BUN/Cr 15/1.05. EGFR 79. Na/K 142/4.5. Rest of the CMP normal  10/2020: HbA1C 11.3%   Review of Systems  Cardiovascular:  Positive for leg swelling and syncope. Negative for chest pain, dyspnea on exertion and palpitations.         Vitals:   06/12/22 0942  BP: (!) 150/83  Pulse: 94  Resp: 16  Temp: 98 F (36.7 C)  SpO2: 97%   Body mass index is 42.9 kg/m. Filed Weights   06/12/22 0942  Weight: 299 lb (135.6 kg)     Objective:   Physical Exam Vitals and nursing note reviewed.  Constitutional:      General: He is not in acute distress. Neck:     Vascular: No JVD.  Cardiovascular:     Rate and Rhythm: Normal rate and regular rhythm.     Heart sounds: Normal heart sounds. No murmur heard. Pulmonary:     Effort: Pulmonary effort is normal.     Breath sounds: Normal breath sounds. No wheezing or rales.  Abdominal:     Palpations: There is no mass.  Musculoskeletal:     Right lower leg: Edema (1+) present.     Left lower leg: Edema (1+) present.  Skin:    Comments: Discoloration and scarring bilateral legs medial aspect, stasis dermatitis          Assessment & Recommendations:   66 y.o. African American male with hypertension, hyperlipidemia, uncontrolled type 2 DM, HFrEF   HFrEF: Acute on chronic. EF now reduced at 45% in 09/2021.  Likely hypertensive cardiomyopathy.  Now clinically symptomatic with bilateral leg edema. Started Jardiance 10 mg daily.  Check labs including BMP, proBNP. At next visit, will change losartan/hydrochlorothiazide to [facility], as well as added spironolactone. Recommend low-salt diet. Will recheck echocardiogram  Syncope: No recurrence since 2022. No significant arrhythmia on cardiac telemetry (09/2021) No significant coronary artery disease (Cath 09/2021) Mildly reduced  EF (echocardiogram 08/2021)   F/u in 3-4 weeks   Nigel Mormon, MD Pager: [phone] Office: [phone]

## 2022-06-20 ENCOUNTER — Telehealth: Payer: Self-pay

## 2022-06-20 NOTE — Telephone Encounter (Signed)
Had new start follow-up call with patient after 1 week of being on BP cuff through RPM. Patient had no complaints at the time.  Average Systolic BP Level 388.82 mmHg Lowest Systolic BP Level 800 mmHg Highest Systolic BP Level 349 mmHg  06/20/2022 Thursday at 07:35 AM 116 / 78      06/19/2022 Wednesday at 09:22 AM 129 / 74      06/18/2022 Tuesday at 08:10 PM 122 / 85      06/18/2022 Tuesday at 07:26 AM 109 / 70      06/17/2022 Monday at 06:21 PM 148 / 92      06/17/2022 Monday at 07:34 AM 146 / 92      06/16/2022 'Sunday at 10:04 PM 148 / 87      06/16/2022 Sunday at 12:31 PM 158 / 99      06/16/2022 Sunday at 01:15 AM 148 / 94      06/15/2022 Saturday at 08:49 AM 136 / 93      06/13/2022 Thursday at 07:49 PM 153 / 108      06/13/2022 Thursday at 07:48 AM 122 / 76      08'$ /12/2021 Wednesday at 10:34 AM 141 / 92

## 2022-06-20 NOTE — Telephone Encounter (Signed)
Okay to continue monitoring for now.  Thanks MJP

## 2022-07-17 ENCOUNTER — Ambulatory Visit: Payer: Managed Care, Other (non HMO)

## 2022-07-17 DIAGNOSIS — I5020 Unspecified systolic (congestive) heart failure: Secondary | ICD-10-CM

## 2022-07-25 ENCOUNTER — Encounter: Payer: Self-pay | Admitting: Cardiology

## 2022-07-25 ENCOUNTER — Ambulatory Visit: Payer: Medicare Other | Admitting: Cardiology

## 2022-07-25 ENCOUNTER — Other Ambulatory Visit: Payer: Self-pay | Admitting: Cardiology

## 2022-07-25 ENCOUNTER — Telehealth: Payer: Self-pay

## 2022-07-25 ENCOUNTER — Other Ambulatory Visit: Payer: Self-pay

## 2022-07-25 VITALS — BP 140/88 | HR 80 | Temp 98.1°F | Resp 16 | Ht 70.0 in | Wt 285.0 lb

## 2022-07-25 DIAGNOSIS — I5020 Unspecified systolic (congestive) heart failure: Secondary | ICD-10-CM

## 2022-07-25 DIAGNOSIS — E1159 Type 2 diabetes mellitus with other circulatory complications: Secondary | ICD-10-CM

## 2022-07-25 DIAGNOSIS — I152 Hypertension secondary to endocrine disorders: Secondary | ICD-10-CM

## 2022-07-25 MED ORDER — SPIRONOLACTONE 25 MG PO TABS: 25.0000 mg | ORAL_TABLET | Freq: Every day | ORAL | 1 refills | Status: DC

## 2022-07-25 MED ORDER — ENTRESTO 24-26 MG PO TABS: 1.0000 | ORAL_TABLET | Freq: Two times a day (BID) | ORAL | 3 refills | Status: DC

## 2022-07-25 MED ORDER — ENTRESTO 24-26 MG PO TABS: 1.0000 | ORAL_TABLET | Freq: Two times a day (BID) | ORAL | 1 refills | Status: DC

## 2022-07-25 MED ORDER — SPIRONOLACTONE 25 MG PO TABS: 25.0000 mg | ORAL_TABLET | Freq: Every day | ORAL | 3 refills | Status: DC

## 2022-07-25 NOTE — Telephone Encounter (Signed)
Pt seen in office today. New meds sent to wrong pharmacy. Needs to go to [place]. Plz resend. I believe it was Entresto and Spironolactone

## 2022-07-25 NOTE — Progress Notes (Signed)
Patient referred by Velna Hatchet, MD for syncoope  Subjective:   Johnny Maddox, male    DOB: 04-25-1956, 66 y.o.   MRN: [id number]   Chief Complaint  Patient presents with   Edema   Follow-up    3 week   Results     HPI  66 y.o. African American male with hypertension, hyperlipidemia, uncontrolled type 2 DM, HFrEF  Reviewed recent test results with the patient, details below. Leg edema persists. Exertional dyspnea is mild.    Current Outpatient Medications:    aspirin EC 81 MG tablet, Take 81 mg by mouth in the morning. Swallow whole., Disp: , Rfl:    atorvastatin (LIPITOR) 40 MG tablet, Take 40 mg by mouth in the morning., Disp: , Rfl:    Continuous Blood Gluc Receiver (FREESTYLE LIBRE READER) DEVI, 1 Device by Other route 3 (three) times daily. E11.65, Disp: 1 each, Rfl: 0   Continuous Blood Gluc Sensor (FREESTYLE LIBRE 14 DAY SENSOR) MISC, USE TO TEST BLOOD SUGAR, CHANGING EVERY 14 DAYS, Disp: , Rfl: 12   Dulaglutide 1.5 MG/0.5ML SOPN, Inject 1.5 mg into the skin once a week. Either Saturdays or Sundays, Disp: , Rfl:    empagliflozin (JARDIANCE) 10 MG TABS tablet, Take 1 tablet (10 mg total) by mouth daily before breakfast., Disp: 30 tablet, Rfl: 3   finasteride (PROSCAR) 5 MG tablet, Take 1 tablet (5 mg total) by mouth daily., Disp: 90 tablet, Rfl: 3   furosemide (LASIX) 20 MG tablet, Take 20 mg by mouth daily., Disp: , Rfl:    GEMTESA 75 MG TABS, TAKE 1 TABLET BY MOUTH DAILY, Disp: 30 tablet, Rfl: 3   HUMALOG KWIKPEN 100 UNIT/ML KiwkPen, Inject 18 Units into the skin with breakfast, with lunch, and with evening meal., Disp: , Rfl: 12   Insulin Glargine (LANTUS SOLOSTAR) 100 UNIT/ML Solostar Pen, Inject 18 Units into the skin at bedtime., Disp: , Rfl:    Insulin Pen Needle (B-D ULTRAFINE III SHORT PEN) 31G X 8 MM MISC, Use 4 (four) times daily, Disp: , Rfl:    metFORMIN (GLUCOPHAGE-XR) 500 MG 24 hr tablet, Take 1,000 mg by mouth in the morning and at bedtime., Disp: ,  Rfl: 5   Multiple Vitamin (MULTIVITAMIN WITH MINERALS) TABS tablet, Take 1 tablet by mouth in the morning., Disp: , Rfl:    sacubitril-valsartan (ENTRESTO) 24-26 MG, Take 1 tablet by mouth 2 (two) times daily., Disp: 60 tablet, Rfl: 3   spironolactone (ALDACTONE) 25 MG tablet, Take 1 tablet (25 mg total) by mouth daily., Disp: 30 tablet, Rfl: 3  Cardiovascular and other pertinent studies:  Echocardiogram 07/17/2022:  Left ventricle cavity is normal in size. Mild concentric hypertrophy of  the left ventricle. Moderate global hypokinesis. LVEF 35-40%. Doppler  evidence of grade I (impaired) diastolic dysfunction, normal LAP.  Structurally normal trileaflet aortic valve.  Mild (Grade I) aortic  regurgitation.  Mild (Grade I) mitral regurgitation.  Normal right atrial pressure.  Compared to previous study on 08/29/2021, LVEF has reduced from 45-50%.   EKG 06/12/2022: Sinus tachycardia 104 bpm  Right bundle branch block  Left anterior fascicular block Nonspecific T-abnormality  Coronary angiography 09/2021: LM: Normal LAD: Mid 30% stenosis Lcx: Normal RCA: Normal   LVEDP 25 mmHg  Mobile cardiac telemetry 4 days 09/24/2021 - 09/28/2021: Dominant rhythm: Sinus. HR 65-137 bpm. Avg HR 102 bpm. 0 episodes of SVT. <1% isolated SVE, couplets 0 episodes of VT. <1% isolated VE, couplets No atrial fibrillation/atrial flutter/SVT/VT/high grade  AV block, sinus pause >3sec noted. 0 patient triggered events.    Recent labs: 01/22/2022: H/H 13/44. MCV 84. Platelets 137  09/27/2021: Glucose 208, BUN/Cr 15/1.05. EGFR 79. Na/K 142/4.5. Rest of the CMP normal  10/2020: HbA1C 11.3%   Review of Systems  Cardiovascular:  Positive for leg swelling and syncope. Negative for chest pain, dyspnea on exertion and palpitations.         Vitals:   07/25/22 1119  BP: (!) 140/88  Pulse: 80  Resp: 16  Temp: 98.1 F (36.7 C)  SpO2: 96%   Body mass index is 40.89 kg/m. Filed Weights    07/25/22 1119  Weight: 285 lb (129.3 kg)     Objective:   Physical Exam Vitals and nursing note reviewed.  Constitutional:      General: He is not in acute distress. Neck:     Vascular: No JVD.  Cardiovascular:     Rate and Rhythm: Normal rate and regular rhythm.     Heart sounds: Normal heart sounds. No murmur heard. Pulmonary:     Effort: Pulmonary effort is normal.     Breath sounds: Normal breath sounds. No wheezing or rales.  Abdominal:     Palpations: There is no mass.  Musculoskeletal:     Right lower leg: Edema (1+) present.     Left lower leg: Edema (1+) present.  Skin:    Comments: Discoloration and scarring bilateral legs medial aspect, stasis dermatitis          Assessment & Recommendations:   66 y.o. African American male with hypertension, hyperlipidemia, uncontrolled type 2 DM, HFrEF   HFrEF: EF 45% in 2022, now 35-40% Unlikely to be ischemic cardiomyopathy, given no obstructive CAD on cath in 09/2021. Change lisinopril-HCTZ to Entresto 24-26 mg bid, Added spironolactone 25 mg daily. Continue Jardiance 10 mg daily,  Check BMP and pro BNP in 1 week. Will add beta blocker at next visit.   Continue remote patient monitoring and principal care management for HFrEF with Haynes Dage, RPH   F/u in 4 weeks   Nigel Mormon, MD Pager: [phone] Office: [phone]

## 2022-07-25 NOTE — Telephone Encounter (Signed)
Ok done

## 2022-08-12 ENCOUNTER — Telehealth: Payer: Self-pay

## 2022-08-12 NOTE — Telephone Encounter (Signed)
Patient's BP has been variable but his HR has been on the higher end consistently.  Average Systolic BP Level 721.82 mmHg Lowest Systolic BP Level 883 mmHg Highest Systolic BP Level 374 mmHg  Average Pulse Level 116.37 BPM Lowest Pulse Level 109 BPM Highest Pulse Level 124 BPM

## 2022-08-12 NOTE — Telephone Encounter (Signed)
Lets check BMP, proBNP. Will address during 10.5 visit.   Thanks MJP

## 2022-08-13 LAB — PRO B NATRIURETIC PEPTIDE: NT-Pro BNP: <36 pg/mL (ref 0–376)

## 2022-08-13 LAB — BASIC METABOLIC PANEL
BUN/Creatinine Ratio: 22 (ref 10–24)
BUN: 22 mg/dL (ref 8–27)
CO2: 20 mmol/L (ref 20–29)
Calcium: 9.8 mg/dL (ref 8.6–10.2)
Chloride: 107 mmol/L — ABNORMAL HIGH (ref 96–106)
Creatinine, Ser: 1 mg/dL (ref 0.76–1.27)
Glucose: 200 mg/dL — ABNORMAL HIGH (ref 70–99)
Potassium: 4.1 mmol/L (ref 3.5–5.2)
Sodium: 142 mmol/L (ref 134–144)
eGFR: 83 mL/min/{1.73_m2} (ref >59)

## 2022-08-15 ENCOUNTER — Ambulatory Visit: Payer: Managed Care, Other (non HMO) | Admitting: Cardiology

## 2022-08-20 ENCOUNTER — Ambulatory Visit: Payer: Managed Care, Other (non HMO)

## 2022-08-21 ENCOUNTER — Ambulatory Visit: Payer: Medicare Other

## 2022-08-21 ENCOUNTER — Ambulatory Visit: Payer: Managed Care, Other (non HMO)

## 2022-08-21 VITALS — BP 126/78 | HR 102 | Temp 97.6°F | Resp 16 | Ht 70.0 in | Wt 302.0 lb

## 2022-08-21 DIAGNOSIS — I152 Hypertension secondary to endocrine disorders: Secondary | ICD-10-CM

## 2022-08-21 DIAGNOSIS — I5020 Unspecified systolic (congestive) heart failure: Secondary | ICD-10-CM

## 2022-08-21 DIAGNOSIS — E1165 Type 2 diabetes mellitus with hyperglycemia: Secondary | ICD-10-CM

## 2022-08-21 MED ORDER — METOPROLOL SUCCINATE ER 25 MG PO TB24: 25.0000 mg | ORAL_TABLET | Freq: Every day | ORAL | 2 refills | Status: DC

## 2022-08-21 NOTE — Progress Notes (Signed)
op   Subjective:   Johnny Maddox, male    DOB: [birth year], 66 y.o.   MRN: [id number]  Chief Complaint  Patient presents with   Follow-up    HF, HTN   66 y.o. African American male with hypertension, hyperlipidemia, uncontrolled type 2 DM, HFrEF  He presents today for 4-week follow-up. Overall, he is doing well.  He recently purchased a treadmill and plans to start an exercise routine.  He is trying to make dietary changes and decrease intake of fried foods and sodium.  He denies chest pain, shortness of breath, dizziness, lightheadedness, palpitations, or fatigue.  He does continue to have bilateral leg swelling.  This is worse during the day after standing or walking for long periods of time.  He has had 17 pound weight gain since last visit.   Current Outpatient Medications:    metoprolol succinate (TOPROL-XL) 25 MG 24 hr tablet, Take 1 tablet (25 mg total) by mouth daily. Take with or immediately following a meal., Disp: 30 tablet, Rfl: 2   aspirin EC 81 MG tablet, Take 81 mg by mouth in the morning. Swallow whole., Disp: , Rfl:    atorvastatin (LIPITOR) 40 MG tablet, Take 40 mg by mouth in the morning., Disp: , Rfl:    Continuous Blood Gluc Receiver (FREESTYLE LIBRE READER) DEVI, 1 Device by Other route 3 (three) times daily. E11.65, Disp: 1 each, Rfl: 0   Continuous Blood Gluc Sensor (FREESTYLE LIBRE 14 DAY SENSOR) MISC, USE TO TEST BLOOD SUGAR, CHANGING EVERY 14 DAYS, Disp: , Rfl: 12   Dulaglutide 1.5 MG/0.5ML SOPN, Inject 1.5 mg into the skin once a week. Either Saturdays or Sundays, Disp: , Rfl:    empagliflozin (JARDIANCE) 10 MG TABS tablet, Take 1 tablet (10 mg total) by mouth daily before breakfast., Disp: 30 tablet, Rfl: 3   finasteride (PROSCAR) 5 MG tablet, Take 1 tablet (5 mg total) by mouth daily., Disp: 90 tablet, Rfl: 3   furosemide (LASIX) 20 MG tablet, Take 20 mg by mouth daily., Disp: , Rfl:    GEMTESA 75 MG TABS, TAKE 1 TABLET BY MOUTH DAILY, Disp: 30 tablet, Rfl: 3    HUMALOG KWIKPEN 100 UNIT/ML KiwkPen, Inject 18 Units into the skin with breakfast, with lunch, and with evening meal., Disp: , Rfl: 12   Insulin Glargine (LANTUS SOLOSTAR) 100 UNIT/ML Solostar Pen, Inject 18 Units into the skin at bedtime., Disp: , Rfl:    Insulin Pen Needle (B-D ULTRAFINE III SHORT PEN) 31G X 8 MM MISC, Use 4 (four) times daily, Disp: , Rfl:    metFORMIN (GLUCOPHAGE-XR) 500 MG 24 hr tablet, Take 1,000 mg by mouth in the morning and at bedtime., Disp: , Rfl: 5   Multiple Vitamin (MULTIVITAMIN WITH MINERALS) TABS tablet, Take 1 tablet by mouth in the morning., Disp: , Rfl:    sacubitril-valsartan (ENTRESTO) 24-26 MG, Take 1 tablet by mouth 2 (two) times daily., Disp: 60 tablet, Rfl: 3   spironolactone (ALDACTONE) 25 MG tablet, Take 1 tablet (25 mg total) by mouth daily., Disp: 30 tablet, Rfl: 3  Cardiovascular and other pertinent studies:  Echocardiogram 07/17/2022:  Left ventricle cavity is normal in size. Mild concentric hypertrophy of  the left ventricle. Moderate global hypokinesis. LVEF 35-40%. Doppler  evidence of grade I (impaired) diastolic dysfunction, normal LAP.  Structurally normal trileaflet aortic valve.  Mild (Grade I) aortic  regurgitation.  Mild (Grade I) mitral regurgitation.  Normal right atrial pressure.  Compared to previous study on 08/29/2021,  LVEF has reduced from 45-50%.   Coronary angiography 09/2021: LM: Normal LAD: Mid 30% stenosis Lcx: Normal RCA: Normal   LVEDP 25 mmHg  Mobile cardiac telemetry 4 days 09/24/2021 - 09/28/2021: Dominant rhythm: Sinus. HR 65-137 bpm. Avg HR 102 bpm. 0 episodes of SVT. <1% isolated SVE, couplets 0 episodes of VT. <1% isolated VE, couplets No atrial fibrillation/atrial flutter/SVT/VT/high grade AV block, sinus pause >3sec noted. 0 patient triggered events.   EKG 08/21/2022: Sinus rhythm at rate of 100 bpm.  Left axis deviation.  Right bundle branch block, left anterior fascicular block. Nonspecific T wave  abnormality.  Compared to previous EKG on 06/12/2022, no significant change.   Recent labs:    Latest Ref Rng & Units 08/12/2022    1:49 PM 01/22/2022    8:37 AM 09/27/2021    2:36 PM  BMP  Glucose 70 - 99 mg/dL 200   208   BUN 8 - 27 mg/dL 22   15   Creatinine 0.76 - 1.27 mg/dL 1.00   1.05   BUN/Creat Ratio 10 - '24 22   14   '$ Sodium 134 - 144 mmol/L 142   142   Potassium 3.5 - 5.2 mmol/L 4.1  3.7  4.5   Chloride 96 - 106 mmol/L 107   103   CO2 20 - 29 mmol/L 20   20   Calcium 8.6 - 10.2 mg/dL 9.8   9.8    ProBNP (last 3 results) Recent Labs    08/12/22 1349  PROBNP <36   01/22/2022: H/H 13/44. MCV 84. Platelets 137  Review of Systems  Cardiovascular:  Positive for leg swelling. Negative for chest pain, claudication, dyspnea on exertion, palpitations and syncope.  Respiratory:  Negative for shortness of breath.   Neurological:  Negative for dizziness.   Vitals:   08/21/22 1422  BP: 126/78  Pulse: (!) 102  Resp: 16  Temp: 97.6 F (36.4 C)  SpO2: 98%   Body mass index is 43.33 kg/m. Filed Weights   08/21/22 1422  Weight: (!) 302 lb (137 kg)    Objective:   Physical Exam Vitals and nursing note reviewed.  Constitutional:      General: He is not in acute distress. Neck:     Vascular: No JVD.  Cardiovascular:     Rate and Rhythm: Regular rhythm. Tachycardia present.     Pulses:          Carotid pulses are 2+ on the right side and 2+ on the left side.      Radial pulses are 2+ on the right side and 2+ on the left side.       Dorsalis pedis pulses are 1+ on the right side and 1+ on the left side.     Heart sounds: Normal heart sounds. No murmur heard. Pulmonary:     Effort: Pulmonary effort is normal.     Breath sounds: Normal breath sounds. No wheezing or rales.  Abdominal:     General: Bowel sounds are normal.     Palpations: Abdomen is soft.     Tenderness: There is no abdominal tenderness.  Musculoskeletal:     Right lower leg: Edema (1+) present.      Left lower leg: Edema (1+) present.  Skin:    Comments: Discoloration and scarring bilateral legs medial aspect, stasis dermatitis  Neurological:     Mental Status: He is alert.    Assessment & Recommendations:   66 y.o. African American male with hypertension, hyperlipidemia,  uncontrolled type 2 DM, HFrEF.  HFrEF (heart failure with reduced ejection fraction) ([place]) EF 45% in 2022, now 35-40% Unlikely to be ischemic cardiomyopathy, given no obstructive CAD on cath in 09/2021. Continue Jardiance 10 mg daily, Entresto 24-26 mg twice daily, spironolactone 25 mg daily. Added ToprolXL '25mg'$  daily for primary prevention and in view of sinus tachycardia. No evidence of acute heart failure on physical exam.  Bilateral lower extremity edema is not new and has not worsened.  Feel that weight gain is related to diet and lack of exercise. Encouraged compression stockings, which she was fitted for in office today and elevation of legs.  Hypertension associated with diabetes ([place]) Blood pressure is well controlled today. Remains enrolled in remote patient BP monitoring, BP has been variable and his heart rate has been consistently on the higher end.  Average Systolic BP Level 009.23 mmHg Lowest Systolic BP Level 300 mmHg Highest Systolic BP Level 762 mmHg  Average Pulse Level 116.37 BPM Lowest Pulse Level 109 BPM Highest Pulse Level 124 BPM  Encouraged lifestyle modifications including dietary changes with low-sodium diet less than 2000 mg daily, exercise at least 30 minutes 5 times per week, and weight loss efforts.  Patient seems motivated for this.  Type 2 diabetes mellitus with hyperglycemia, with long-term current use of insulin ([place]) Diabetes is controlled, managed by PCP.  Continue remote patient monitoring and principal care management for HFrEF with Haynes Dage, RPH  F/u in 3 months or sooner if needed.   Ernst Spell, AGNP-C Office: [phone]

## 2022-09-18 ENCOUNTER — Other Ambulatory Visit: Payer: Self-pay

## 2022-09-18 DIAGNOSIS — I471 Supraventricular tachycardia, unspecified: Secondary | ICD-10-CM

## 2022-09-18 MED ORDER — IVABRADINE HCL 5 MG PO TABS: 5.0000 mg | ORAL_TABLET | Freq: Two times a day (BID) | ORAL | 2 refills | Status: DC

## 2022-09-18 NOTE — Telephone Encounter (Signed)
Per conversation with Dr. Virgina Jock - adding corlanor '5mg'$  BID with meal due to sustained increased HR and EF 35-45%.  Notably patient has not been taking metoprolol due to causing sleepiness/lethargy.   Average Pulse Level 117.22 BPM Lowest Pulse Level 108 BPM Highest Pulse Level 128 BPM  09/18/2022 Wednesday at 07:36 AM 110      09/17/2022 Tuesday at 10:43 PM 117      09/17/2022 Tuesday at 07:24 AM 118      09/16/2022 Monday at 07:33 AM 128      09/15/2022 'Sunday at 06:15 PM 125      09/15/2022 Sunday at 11:03 AM 117      09/13/2022 Friday at 07:51 AM 108      09/12/2022 Thursday at 08:20 AM 123      09/11/2022 Wednesday at 09:19 PM 109      10'$ /31/2023 Tuesday at 11:11 PM 113

## 2022-10-01 ENCOUNTER — Other Ambulatory Visit: Payer: Self-pay

## 2022-10-01 DIAGNOSIS — R3915 Urgency of urination: Secondary | ICD-10-CM

## 2022-10-02 MED ORDER — GEMTESA 75 MG PO TABS: 1.0000 | ORAL_TABLET | Freq: Every day | ORAL | 3 refills | Status: DC

## 2022-10-10 ENCOUNTER — Other Ambulatory Visit: Payer: Self-pay | Admitting: Cardiology

## 2022-10-10 DIAGNOSIS — I5020 Unspecified systolic (congestive) heart failure: Secondary | ICD-10-CM

## 2022-10-14 ENCOUNTER — Other Ambulatory Visit: Payer: Self-pay

## 2022-10-14 DIAGNOSIS — I5020 Unspecified systolic (congestive) heart failure: Secondary | ICD-10-CM

## 2022-10-14 MED ORDER — SPIRONOLACTONE 25 MG PO TABS: 25.0000 mg | ORAL_TABLET | Freq: Every day | ORAL | 3 refills | Status: DC

## 2022-10-14 MED ORDER — EMPAGLIFLOZIN 10 MG PO TABS: 10.0000 mg | ORAL_TABLET | Freq: Every day | ORAL | 3 refills | Status: DC

## 2022-10-14 NOTE — Telephone Encounter (Signed)
Patient requesting refills of spironolactone and Jardiance.

## 2022-10-17 ENCOUNTER — Other Ambulatory Visit: Payer: Self-pay | Admitting: Internal Medicine

## 2022-11-05 ENCOUNTER — Telehealth: Payer: Self-pay

## 2022-11-05 ENCOUNTER — Telehealth: Payer: Self-pay | Admitting: *Deleted

## 2022-11-05 NOTE — Telephone Encounter (Signed)
Discussed patient symptoms with Doctor Chrystal patient is not currently under treatment and was advised to see his urologist or PCP. Patient verbalized understanding.

## 2022-11-05 NOTE — Telephone Encounter (Signed)
Patient called reporting that he is having pain in his legs and also in between his legs. He states that he has seed implants and that the pain is dull as well as feels like he is getting shocks too. He states he feels as if a "gorilla holding on to my legs and beating my legs"  He "would really like to come in today please".

## 2022-11-05 NOTE — Telephone Encounter (Signed)
Incoming call from patient who states he is having debilitating leg pain and was told to call our office for assistance. Patient states he has had leg pain on and off for the last several weeks. Patient states that yesterday he had to "crawl up the steps". He states that pain is radiating down his legs. Patient states he was scheduled to see his PCP today at 3pm, but that office called him back and rescheduled him to tomorrow. Patient denies any UTI, kidney stone, or prostatitis symptoms.   Lengthy conversation had with patient on the scope of urology as well as the fact that we do not have pain medication in clinic. Advised pt to seek care in ED if indeed pain is uncontrollable as they would be able to offer pain control medications and facilitate imaging for possible leg pain etiologies. Patient voiced understanding.

## 2022-11-12 ENCOUNTER — Other Ambulatory Visit: Payer: Self-pay | Admitting: Adult Health

## 2022-11-12 ENCOUNTER — Other Ambulatory Visit: Payer: Self-pay | Admitting: *Deleted

## 2022-11-12 DIAGNOSIS — M5416 Radiculopathy, lumbar region: Secondary | ICD-10-CM

## 2022-11-12 DIAGNOSIS — C61 Malignant neoplasm of prostate: Secondary | ICD-10-CM

## 2022-11-12 DIAGNOSIS — R29898 Other symptoms and signs involving the musculoskeletal system: Secondary | ICD-10-CM

## 2022-11-14 ENCOUNTER — Other Ambulatory Visit: Payer: Medicare Other

## 2022-11-14 DIAGNOSIS — C61 Malignant neoplasm of prostate: Secondary | ICD-10-CM

## 2022-11-15 LAB — PSA: Prostate Specific Ag, Serum: <0.1 ng/mL (ref 0.0–4.0)

## 2022-11-17 ENCOUNTER — Encounter (HOSPITAL_COMMUNITY): Payer: Self-pay

## 2022-11-17 ENCOUNTER — Ambulatory Visit (HOSPITAL_COMMUNITY): Admission: RE | Admit: 2022-11-17 | Payer: Medicare Other | Source: Ambulatory Visit

## 2022-11-17 ENCOUNTER — Other Ambulatory Visit: Payer: Self-pay | Admitting: Urology

## 2022-11-17 DIAGNOSIS — R3915 Urgency of urination: Secondary | ICD-10-CM

## 2022-11-18 ENCOUNTER — Other Ambulatory Visit: Payer: Medicare Other

## 2022-11-18 NOTE — Progress Notes (Unsigned)
11/19/2022 9:40 AM   Johnny Maddox 01-02-1956 [id number]  Referring provider: Velna Hatchet, [place],  [place] [address]  Urological history: 1. Intermittent unfavorable risk prostate cancer -PSA (11/2022) <0.1 -s/p I-125 prostate seeds implantation 01/28/2022 -Has follow-up with Dr. Donella Stade in January   2. ED -Contributing factors of age, BPH, prostate cancer, radioactive seed placement, diabetes, hypertension, hyperlipidemia, coronary artery disease and sleep apnea -SHIM 14 -sildenafil 100 mg on demand dosing  3. BPH with LU TS -I PSS 11/2 -PVR 35 mL   4.  Left testicular mass -scrotal ultrasound 08/2020 Stable 4 mm nodule in the left testicle. This is favored to be benign given its 3 year stability   Benign Prostatic Hypertrophy   HPI: Johnny Maddox is a 67 y.o. male who presents today for 6 months follow up.   I PSS 11/2  PVR 35 mL  He is having bothersome urge incontinence.  He is taking the [place] daily.  He feels there is some minor improvement w/ Gemtesa.  He is consuming energy drinks throughout the day to give him energy.  He does have sleep apnea, but he does not sleep with a CPAP machine as it makes him feel like he is suffocating.  He also does not want to pursue the surgical treatment for sleep apnea as he is afraid of going under general anesthesia.  He is very overwhelmed at today's visit.  He states his insurance company's clinical nurse advised him to get a full body scan to make sure he does not have cancer anywhere else.  Patient denies any modifying or aggravating factors.  Patient denies any gross hematuria, dysuria or suprapubic/flank pain.  Patient denies any fevers, chills, nausea or vomiting.      IPSS     Row Name 11/19/22 0800         International Prostate Symptom Score   How often have you had the sensation of not emptying your bladder? Not at All     How often have you had to urinate less than every two hours?  More than half the time     How often have you found you stopped and started again several times when you urinated? Not at All     How often have you found it difficult to postpone urination? Almost always     How often have you had a weak urinary stream? Not at All     How often have you had to strain to start urination? Not at All     How many times did you typically get up at night to urinate? 2 Times     Total IPSS Score 11       Quality of Life due to urinary symptoms   If you were to spend the rest of your life with your urinary condition just the way it is now how would you feel about that? Mostly Satisfied               Score:  1-7 Mild 8-19 Moderate 20-35 Severe   Patient still having spontaneous erections.  He denies any pain or curvature with erections.  He has not been sexually active.  He was under the impression that he could not take the sildenafil due to his prostate cancer.     SHIM     Row Name 11/19/22 0902         SHIM: Over the last 6 months:   How do you  rate your confidence that you could get and keep an erection? Low     When you had erections with sexual stimulation, how often were your erections hard enough for penetration (entering your partner)? A Few Times (much less than half the time)     During sexual intercourse, how often were you able to maintain your erection after you had penetrated (entered) your partner? Sometimes (about half the time)     During sexual intercourse, how difficult was it to maintain your erection to completion of intercourse? Difficult     When you attempted sexual intercourse, how often was it satisfactory for you? A Few Times (much less than half the time)       SHIM Total Score   SHIM 12              Score: 1-7 Severe ED 8-11 Moderate ED 12-16 Mild-Moderate ED 17-21 Mild ED 22-25 No ED    PMH: Past Medical History:  Diagnosis Date   Acute laryngitis, without mention of obstruction    Allergic  rhinitis, cause unspecified    Chalazion    Cough    Dermatophytosis of foot    Diabetes mellitus without complication (HCC)    ED (erectile dysfunction)    Elevated PSA    Hyperlipidemia    Hypertension    Microalbuminuria    Microalbuminuria    Overweight    Prostate cancer ([place])    Sarcoidosis    Seborrheic dermatitis, unspecified    Sleep apnea    Unspecified disorder of autonomic nervous system    Unspecified sleep apnea    Unspecified venous (peripheral) insufficiency    Unspecified vitamin D deficiency     Surgical History: Past Surgical History:  Procedure Laterality Date   CATARACT EXTRACTION Bilateral    CIRCUMCISION     HYDROCELE EXCISION Right 09/23/2016   Procedure: HYDROCELECTOMY ADULT ;  Surgeon: Hollice Espy, MD;  Location: [facility];  Service: Urology;  Laterality: Right;  INGUINAL APPROACH   LEFT HEART CATH AND CORONARY ANGIOGRAPHY N/A 10/02/2021   Procedure: LEFT HEART CATH AND CORONARY ANGIOGRAPHY;  Surgeon: Nigel Mormon, MD;  Location: [place] [facility];  Service: Cardiovascular;  Laterality: N/A;   RADIOACTIVE SEED IMPLANT N/A 01/28/2022   Procedure: RADIOACTIVE SEED IMPLANT/BRACHYTHERAPY IMPLANT;  Surgeon: Hollice Espy, MD;  Location: [facility];  Service: Urology;  Laterality: N/A;  88 seeds 28 needles   ROTATOR CUFF REPAIR  R 2011, L 2006   L and R shoulder   TONSILLECTOMY AND ADENOIDECTOMY  1964   WISDOM TOOTH EXTRACTION     x4    Home Medications:  Allergies as of 11/19/2022       Reactions   Other Swelling   Mushrooms-lip swelling   Lisinopril Cough   [place] Nut (berthollefia Excelsa) Skin Test Rash   Allergic to [place] nut        Medication List        Accurate as of November 19, 2022  9:40 AM. If you have any questions, ask your nurse or doctor.          aspirin EC 81 MG tablet Take 81 mg by mouth in the morning. Swallow whole.   atorvastatin 40 MG tablet Commonly known as: LIPITOR Take 40 mg by mouth in the  morning.   B-D ULTRAFINE III SHORT PEN 31G X 8 MM Misc Generic drug: Insulin Pen Needle Use 4 (four) times daily   Dulaglutide 1.5 MG/0.5ML Sopn Inject 1.5 mg into  the skin once a week. Either Saturdays or Sundays   empagliflozin 10 MG Tabs tablet Commonly known as: Jardiance Take 1 tablet (10 mg total) by mouth daily before breakfast.   Entresto 24-26 MG Generic drug: sacubitril-valsartan TAKE 1 TABLET BY MOUTH TWICE DAILY   finasteride 5 MG tablet Commonly known as: PROSCAR Take 1 tablet (5 mg total) by mouth daily.   FreeStyle Libre 14 Day Sensor Misc USE TO TEST BLOOD SUGAR, CHANGING EVERY 14 DAYS   FreeStyle Libre Reader Devi 1 Device by Other route 3 (three) times daily. E11.65   furosemide 20 MG tablet Commonly known as: LASIX Take 20 mg by mouth daily.   Gemtesa 75 MG Tabs Generic drug: Vibegron TAKE 1 TABLET BY MOUTH DAILY   HumaLOG KwikPen 100 UNIT/ML KwikPen Generic drug: insulin lispro Inject 18 Units into the skin with breakfast, with lunch, and with evening meal.   ivabradine 5 MG Tabs tablet Commonly known as: Corlanor Take 1 tablet (5 mg total) by mouth 2 (two) times daily with a meal.   Lantus SoloStar 100 UNIT/ML Solostar Pen Generic drug: insulin glargine Inject 18 Units into the skin at bedtime.   metFORMIN 500 MG 24 hr tablet Commonly known as: GLUCOPHAGE-XR Take 1,000 mg by mouth in the morning and at bedtime.   metoprolol succinate 25 MG 24 hr tablet Commonly known as: TOPROL-XL Take 1 tablet (25 mg total) by mouth daily. Take with or immediately following a meal.   multivitamin with minerals Tabs tablet Take 1 tablet by mouth in the morning.   sildenafil 100 MG tablet Commonly known as: VIAGRA Take 1 tablet (100 mg total) by mouth daily as needed for erectile dysfunction. Take two hours prior to intercourse on an empty stomach Started by: Zara Council, PA-C   spironolactone 25 MG tablet Commonly known as: ALDACTONE Take 1  tablet (25 mg total) by mouth daily.        Allergies:  Allergies  Allergen Reactions   Other Swelling    Mushrooms-lip swelling   Lisinopril Cough   [place] Nut Daisey Must) Skin Test Rash    Allergic to [place] nut    Family History: Family History  Problem Relation Age of Onset   Cancer Mother        leukemia   Hypertension Mother    Alcohol abuse Father    Other Father        Father was Murdered @ age 44   Hypertension Brother    Cancer - Lung Brother    Stroke Brother    Prostate cancer Neg Hx    Bladder Cancer Neg Hx    Kidney disease Neg Hx    Kidney cancer Neg Hx     Social History:  reports that he has quit smoking. His smoking use included cigarettes. He has never used smokeless tobacco. He reports current alcohol use of about 2.0 standard drinks of alcohol per week. He reports that he does not use drugs.  ROS: Pertinent ROS in HPI  Physical Exam: BP 119/72   Pulse (!) 101   Ht '5\' 10"'$  (1.778 m)   Wt 285 lb (129.3 kg)   BMI 40.89 kg/m   Constitutional:  Well nourished. Alert and oriented, No acute distress. HEENT: [place] AT, moist mucus membranes.  Trachea midline Cardiovascular: No clubbing, cyanosis, or edema. Respiratory: Normal respiratory effort, no increased work of breathing. Neurologic: Grossly intact, no focal deficits, moving all 4 extremities. Psychiatric: Normal mood and affect.   Laboratory  Data: See Urological history and EPIC I have reviewed the labs.   Pertinent Imaging:  11/19/22 08:58  Scan Result 35    Assessment & Plan:    1.  Prostate cancer -PSA undetectable  -Unfavorable intermittent prostate cancer -He has upcoming appointments with Dr. Donella Stade in January -Explained that PSA levels is what is indicated to monitor prostate cancer and that since his is undetectable at this time this is good news -I advised him to really think through getting all over body scan citing cost and also the possibility of opening a  Pandora's box as CT scans often find " unknowns " that have to be followed with serial CT scans which will incur additional cost and exposure to radiation and will likely turn out to be nothing -I explained the best thing that he can do in order to prevent any cancers from occurring are to increase fruits and vegetables into his diet, lose weight and increase exercise -He will return in 6 months for PSA, IPSS and SHIM  2. BPH with LU TS -continue Gemtesa 75 mg daily -Explained that the energy drinks may be exacerbating his urgency and incontinence and tried to reduce the intake  3. ED -Advised him that sildenafil is only contraindicated with nitroglycerin products and that he is safe to use it after a diagnosis of prostate cancer -Sildenafil is refilled  Return in about 6 months (around 05/20/2023) for IPSS, SHIM, PSA and exam.  These notes generated with voice recognition software. I apologize for typographical errors.  [place], [place] [address]  [place], [place] [address] [phone]

## 2022-11-19 ENCOUNTER — Encounter: Payer: Self-pay | Admitting: Urology

## 2022-11-19 ENCOUNTER — Ambulatory Visit (INDEPENDENT_AMBULATORY_CARE_PROVIDER_SITE_OTHER): Payer: Managed Care, Other (non HMO) | Admitting: Urology

## 2022-11-19 VITALS — BP 119/72 | HR 101 | Ht 70.0 in | Wt 285.0 lb

## 2022-11-19 DIAGNOSIS — N401 Enlarged prostate with lower urinary tract symptoms: Secondary | ICD-10-CM

## 2022-11-19 DIAGNOSIS — N138 Other obstructive and reflux uropathy: Secondary | ICD-10-CM

## 2022-11-19 DIAGNOSIS — N529 Male erectile dysfunction, unspecified: Secondary | ICD-10-CM | POA: Diagnosis not present

## 2022-11-19 DIAGNOSIS — C61 Malignant neoplasm of prostate: Secondary | ICD-10-CM

## 2022-11-19 LAB — BLADDER SCAN AMB NON-IMAGING: Scan Result: 35

## 2022-11-19 MED ORDER — SILDENAFIL CITRATE 100 MG PO TABS: 100.0000 mg | ORAL_TABLET | Freq: Every day | ORAL | 3 refills | Status: DC | PRN

## 2022-11-21 ENCOUNTER — Ambulatory Visit: Payer: Managed Care, Other (non HMO)

## 2022-11-22 ENCOUNTER — Telehealth: Payer: Self-pay

## 2022-11-22 NOTE — Telephone Encounter (Signed)
Patient home BP has been controlled. Pulse still elevated. As of 1/2, patient had not yet started Corlanor.  Average Systolic BP Level 748.27 mmHg Lowest Systolic BP Level 94 mmHg Highest Systolic BP Level 078 mmHg  Average Pulse Level 106.35 BPM Lowest Pulse Level 63 BPM Highest Pulse Level 132 BPM  11/20/2022 Wednesday at 07:30 AM 129 / 80      11/19/2022 Tuesday at 08:08 AM 126 / 69      11/18/2022 Monday at 06:43 PM 123 / 73      11/18/2022 Monday at 07:37 AM 124 / 82      11/16/2022 Saturday at 04:45 PM 101 / 62      11/14/2022 Thursday at 09:17 PM 132 / 76      11/13/2022 Wednesday at 08:39 PM 129 / 77      11/11/2022 Monday at 09:00 PM 168 / 100      11/10/2022 'Sunday at 10:31 AM 116 / 72      12'$ /30/2023 Saturday at 12:02 PM 111 / 76

## 2022-11-23 ENCOUNTER — Ambulatory Visit (HOSPITAL_COMMUNITY): Payer: Managed Care, Other (non HMO)

## 2022-11-25 ENCOUNTER — Ambulatory Visit: Payer: Managed Care, Other (non HMO) | Admitting: Cardiology

## 2022-11-25 NOTE — Telephone Encounter (Signed)
In the meantime, let us go up on metoprolol succinate to 50 mg daily.  Thanks MJP

## 2022-11-27 NOTE — Telephone Encounter (Signed)
Ok. Will discuss then.  Thanks MJP

## 2022-12-02 ENCOUNTER — Inpatient Hospital Stay: Payer: Managed Care, Other (non HMO) | Attending: Radiation Oncology

## 2022-12-02 ENCOUNTER — Other Ambulatory Visit: Payer: Self-pay | Admitting: Urology

## 2022-12-02 DIAGNOSIS — C61 Malignant neoplasm of prostate: Secondary | ICD-10-CM | POA: Diagnosis present

## 2022-12-02 DIAGNOSIS — N138 Other obstructive and reflux uropathy: Secondary | ICD-10-CM

## 2022-12-02 LAB — PSA: Prostatic Specific Antigen: <0.01 ng/mL (ref 0.00–4.00)

## 2022-12-04 ENCOUNTER — Other Ambulatory Visit (HOSPITAL_COMMUNITY): Payer: Self-pay

## 2022-12-04 MED ORDER — OZEMPIC (1 MG/DOSE) 4 MG/3ML ~~LOC~~ SOPN
1.0000 mg | PEN_INJECTOR | SUBCUTANEOUS | 3 refills | Status: AC
  Filled 2022-12-04 – 2022-12-09 (×4): qty 3, 28d supply, fill #0

## 2022-12-05 ENCOUNTER — Other Ambulatory Visit: Payer: Self-pay | Admitting: Urology

## 2022-12-05 ENCOUNTER — Other Ambulatory Visit (HOSPITAL_COMMUNITY): Payer: Self-pay

## 2022-12-05 DIAGNOSIS — N401 Enlarged prostate with lower urinary tract symptoms: Secondary | ICD-10-CM

## 2022-12-07 ENCOUNTER — Ambulatory Visit (HOSPITAL_COMMUNITY)
Admission: RE | Admit: 2022-12-07 | Discharge: 2022-12-07 | Disposition: A | Discharge status: Home / Self Care | Payer: Managed Care, Other (non HMO) | Source: Ambulatory Visit | Attending: Adult Health | Admitting: Adult Health

## 2022-12-07 DIAGNOSIS — C61 Malignant neoplasm of prostate: Secondary | ICD-10-CM | POA: Diagnosis present

## 2022-12-07 DIAGNOSIS — M5416 Radiculopathy, lumbar region: Secondary | ICD-10-CM

## 2022-12-07 DIAGNOSIS — R29898 Other symptoms and signs involving the musculoskeletal system: Secondary | ICD-10-CM | POA: Diagnosis present

## 2022-12-07 MED ORDER — GADOBUTROL 1 MMOL/ML IV SOLN
10.0000 mL | Freq: Once | INTRAVENOUS | Status: AC | PRN
  Administered 2022-12-07: 10 mL via INTRAVENOUS

## 2022-12-09 ENCOUNTER — Telehealth: Payer: Self-pay

## 2022-12-09 ENCOUNTER — Other Ambulatory Visit (HOSPITAL_COMMUNITY): Payer: Self-pay

## 2022-12-09 ENCOUNTER — Ambulatory Visit
Admission: RE | Admit: 2022-12-09 | Discharge: 2022-12-09 | Disposition: A | Discharge status: Home / Self Care | Payer: Managed Care, Other (non HMO) | Source: Ambulatory Visit | Attending: Radiation Oncology | Admitting: Radiation Oncology

## 2022-12-09 ENCOUNTER — Encounter: Payer: Self-pay | Admitting: Radiation Oncology

## 2022-12-09 ENCOUNTER — Ambulatory Visit: Payer: Medicare Other | Admitting: Cardiology

## 2022-12-09 ENCOUNTER — Other Ambulatory Visit: Payer: Self-pay | Admitting: *Deleted

## 2022-12-09 ENCOUNTER — Encounter: Payer: Self-pay | Admitting: Cardiology

## 2022-12-09 VITALS — BP 134/74 | HR 98 | Temp 97.1°F | Resp 18 | Ht 70.0 in | Wt 290.0 lb

## 2022-12-09 VITALS — BP 123/71 | HR 94 | Resp 16 | Ht 70.0 in | Wt 290.0 lb

## 2022-12-09 DIAGNOSIS — C61 Malignant neoplasm of prostate: Secondary | ICD-10-CM | POA: Insufficient documentation

## 2022-12-09 DIAGNOSIS — R351 Nocturia: Secondary | ICD-10-CM | POA: Insufficient documentation

## 2022-12-09 DIAGNOSIS — I10 Essential (primary) hypertension: Secondary | ICD-10-CM

## 2022-12-09 DIAGNOSIS — Z923 Personal history of irradiation: Secondary | ICD-10-CM | POA: Diagnosis not present

## 2022-12-09 DIAGNOSIS — I5020 Unspecified systolic (congestive) heart failure: Secondary | ICD-10-CM

## 2022-12-09 MED ORDER — METOPROLOL SUCCINATE ER 50 MG PO TB24: 50.0000 mg | ORAL_TABLET | Freq: Every day | ORAL | 3 refills | Status: DC

## 2022-12-09 MED ORDER — ENTRESTO 49-51 MG PO TABS: 1.0000 | ORAL_TABLET | Freq: Two times a day (BID) | ORAL | 3 refills | Status: DC

## 2022-12-09 NOTE — Telephone Encounter (Signed)
Patient home BP is overall moderately controlled. Patient pulse is elevated at baseline. Unknown if patient has started Corlanor at this time. Patient not taking metoprolol  Average Systolic BP Level 680.32 mmHg Lowest Systolic BP Level 122 mmHg Highest Systolic BP Level 482 mmHg  Average Diastolic BP Level 50.03 mmHg Lowest Diastolic BP Level 62 mmHg Highest Diastolic BP Level 704 mmHg  Average Pulse Level 108.09 BPM Lowest Pulse Level 73 BPM Highest Pulse Level 123 BPM  12/09/2022 Monday at 07:46 AM 122 / 75      12/07/2022 Saturday at 07:24 AM 115 / 74      12/06/2022 Friday at 07:37 AM 126 / 85      12/05/2022 Thursday at 07:37 AM 126 / 81      12/04/2022 Wednesday at 11:37 PM 166 / 91      12/03/2022 Tuesday at 06:12 PM 135 / 78      12/03/2022 Tuesday at 07:37 AM 115 / 73      12/02/2022 Monday at 08:20 PM 125 / 71      11/30/2022 Saturday at 11:02 PM 140 / 91      11/29/2022 Friday at 07:36 AM 128 / 80      11/27/2022 Wednesday at 09:11 PM 143 / 89      11/25/2022 Monday at 10:15 PM 153 / 96

## 2022-12-09 NOTE — Progress Notes (Signed)
Radiation Oncology Follow up Note  Name: Johnny Maddox   Date:   12/09/2022 MRN:  [id number] DOB: 12/29/1955    This 67 y.o. male presents to the clinic today for 59-monthfollow-up status post I-125 interstitial implant for Gleason 7 (4+3) adenocarcinoma the prostate presenting with a PSA of 7.7.  REFERRING PROVIDER: HVelna Hatchet MD  HPI: Patient is a 67year old male now out 10 months having completed I-125 interstitial implant for Gleason 7 adenocarcinoma the prostate.  Seen today in routine follow-up he is doing well.  Specifically denies any increased lower urinary tract symptoms diarrhea or fatigue.  He has nocturia x 1.  His most recent PSA this month is less than 0.01.  Patient had MR scan of his lumbar spine this month showing no osseous abnormality or evidence of metastatic disease mild degenerative changes of his lumbar spine.  Those images were reviewed  COMPLICATIONS OF TREATMENT: none  FOLLOW UP COMPLIANCE: keeps appointments   PHYSICAL EXAM:  BP 134/74   Pulse 98   Temp (!) 97.1 F (36.2 C)   Resp 18   Ht '5\' 10"'$  (1.778 m)   Wt 290 lb (131.5 kg)   BMI 41.61 kg/m  Well-developed well-nourished patient in NAD. HEENT reveals PERLA, EOMI, discs not visualized.  Oral cavity is clear. No oral mucosal lesions are identified. Neck is clear without evidence of cervical or supraclavicular adenopathy. Lungs are clear to A&P. Cardiac examination is essentially unremarkable with regular rate and rhythm without murmur rub or thrill. Abdomen is benign with no organomegaly or masses noted. Motor sensory and DTR levels are equal and symmetric in the upper and lower extremities. Cranial nerves II through XII are grossly intact. Proprioception is intact. No peripheral adenopathy or edema is identified. No motor or sensory levels are noted. Crude visual fields are within normal range.  RADIOLOGY RESULTS: MRI of his lumbar spine reviewed compatible with above-stated findings  PLAN: Present  time patient is doing well with no evidence of disease now out 10 months from I-125 interstitial implant under excellent biochemical control of his prostate cancer.  On pleased with his overall progress.  I have asked to see him back in 6 months for follow-up.  Should he continue his PSA level will start once year visits after that.  Patient comprehends my recommendations well.  I would like to take this opportunity to thank you for allowing me to participate in the care of your patient..Noreene Filbert MD

## 2022-12-09 NOTE — Progress Notes (Signed)
Patient referred by Velna Hatchet, MD for syncoope  Subjective:   Johnny Maddox, male    DOB: [birth year], 67 y.o.   MRN: [id number]   Chief Complaint  Patient presents with   HFrEF   Hyperlipidemia   Follow-up    3 month     HPI  67 y.o. African American male with hypertension, hyperlipidemia, uncontrolled type 2 DM, HFrEF   Patient home BP is overall moderately controlled. Patient pulse is elevated at baseline. Unknown if patient has started Corlanor at this time. Patient not taking metoprolol   Average Systolic BP Level 846.96 mmHg Lowest Systolic BP Level 295 mmHg Highest Systolic BP Level 284 mmHg   Average Diastolic BP Level 13.24 mmHg Lowest Diastolic BP Level 62 mmHg Highest Diastolic BP Level 401 mmHg   Average Pulse Level 108.09 BPM Lowest Pulse Level 73 BPM Highest Pulse Level 123 BPM   12/09/2022 Monday at 07:46 AM       122 / 75                12/07/2022 Saturday at 07:24 AM      115 / 74                12/06/2022 Friday at 07:37 AM          126 / 85                12/05/2022 Thursday at 07:37 AM     126 / 81                12/04/2022 Wednesday at 11:37 PM 166 / 91                12/03/2022 Tuesday at 06:12 PM      135 / 78                12/03/2022 Tuesday at 07:37 AM      115 / 73                12/02/2022 Monday at 08:20 PM       125 / 71                11/30/2022 Saturday at 11:02 PM      140 / 91                11/29/2022 Friday at 07:36 AM          128 / 80                11/27/2022 Wednesday at 09:11 PM 143 / 89                11/25/2022 Monday at 10:15 PM       153 / 96  Overall, patient has had significant improvement in dyspnea symptoms.  Leg edema persists, but has improved.  Patient reports that he has known OSA, but does not use CPAP regularly.  He does feel very sleepy during the daytime and drinks a lot of caffeine.   Current Outpatient Medications:    aspirin EC 81 MG tablet, Take 81 mg by mouth in the morning. Swallow whole., Disp:  , Rfl:    atorvastatin (LIPITOR) 40 MG tablet, Take 40 mg by mouth in the morning., Disp: , Rfl:    Continuous Blood Gluc Receiver (FREESTYLE LIBRE READER) DEVI, 1 Device by Other route 3 (three) times daily. E11.65, Disp: 1 each, Rfl: 0   Continuous Blood  Gluc Sensor (FREESTYLE LIBRE 14 DAY SENSOR) MISC, USE TO TEST BLOOD SUGAR, CHANGING EVERY 14 DAYS, Disp: , Rfl: 12   empagliflozin (JARDIANCE) 10 MG TABS tablet, Take 1 tablet (10 mg total) by mouth daily before breakfast., Disp: 30 tablet, Rfl: 3   finasteride (PROSCAR) 5 MG tablet, TAKE 1 TABLET BY MOUTH DAILY, Disp: 90 tablet, Rfl: 3   furosemide (LASIX) 20 MG tablet, Take 20 mg by mouth daily., Disp: , Rfl:    GEMTESA 75 MG TABS, TAKE 1 TABLET BY MOUTH DAILY, Disp: 90 tablet, Rfl: 3   HUMALOG KWIKPEN 100 UNIT/ML KiwkPen, Inject 18 Units into the skin with breakfast, with lunch, and with evening meal., Disp: , Rfl: 12   Insulin Glargine (LANTUS SOLOSTAR) 100 UNIT/ML Solostar Pen, Inject 18 Units into the skin at bedtime., Disp: , Rfl:    Insulin Pen Needle (B-D ULTRAFINE III SHORT PEN) 31G X 8 MM MISC, Use 4 (four) times daily, Disp: , Rfl:    ivabradine (CORLANOR) 5 MG TABS tablet, Take 1 tablet (5 mg total) by mouth 2 (two) times daily with a meal., Disp: 60 tablet, Rfl: 2   metFORMIN (GLUCOPHAGE-XR) 500 MG 24 hr tablet, Take 1,000 mg by mouth in the morning and at bedtime., Disp: , Rfl: 5   metoprolol succinate (TOPROL-XL) 25 MG 24 hr tablet, Take 1 tablet (25 mg total) by mouth daily. Take with or immediately following a meal., Disp: 30 tablet, Rfl: 2   Multiple Vitamin (MULTIVITAMIN WITH MINERALS) TABS tablet, Take 1 tablet by mouth in the morning., Disp: , Rfl:    sacubitril-valsartan (ENTRESTO) 24-26 MG, TAKE 1 TABLET BY MOUTH TWICE DAILY, Disp: 180 tablet, Rfl: 1   Semaglutide, 1 MG/DOSE, (OZEMPIC, 1 MG/DOSE,) 4 MG/3ML SOPN, Inject 1 mg into the skin once a week as directed, Disp: 3 mL, Rfl: 3   spironolactone (ALDACTONE) 25 MG tablet,  Take 1 tablet (25 mg total) by mouth daily., Disp: 90 tablet, Rfl: 3   sildenafil (VIAGRA) 100 MG tablet, Take 1 tablet (100 mg total) by mouth daily as needed for erectile dysfunction. Take two hours prior to intercourse on an empty stomach (Patient not taking: Reported on 12/09/2022), Disp: 30 tablet, Rfl: 3  Cardiovascular and other pertinent studies:  Echocardiogram 07/17/2022:  Left ventricle cavity is normal in size. Mild concentric hypertrophy of  the left ventricle. Moderate global hypokinesis. LVEF 35-40%. Doppler  evidence of grade I (impaired) diastolic dysfunction, normal LAP.  Structurally normal trileaflet aortic valve.  Mild (Grade I) aortic  regurgitation.  Mild (Grade I) mitral regurgitation.  Normal right atrial pressure.  Compared to previous study on 08/29/2021, LVEF has reduced from 45-50%.   EKG 06/12/2022: Sinus tachycardia 104 bpm  Right bundle branch block  Left anterior fascicular block Nonspecific T-abnormality  Coronary angiography 09/2021: LM: Normal LAD: Mid 30% stenosis Lcx: Normal RCA: Normal   LVEDP 25 mmHg  Mobile cardiac telemetry 4 days 09/24/2021 - 09/28/2021: Dominant rhythm: Sinus. HR 65-137 bpm. Avg HR 102 bpm. 0 episodes of SVT. <1% isolated SVE, couplets 0 episodes of VT. <1% isolated VE, couplets No atrial fibrillation/atrial flutter/SVT/VT/high grade AV block, sinus pause >3sec noted. 0 patient triggered events.    Recent labs: 01/22/2022: H/H 13/44. MCV 84. Platelets 137  09/27/2021: Glucose 208, BUN/Cr 15/1.05. EGFR 79. Na/K 142/4.5. Rest of the CMP normal  10/2020: HbA1C 11.3%   Review of Systems  Cardiovascular:  Positive for leg swelling. Negative for chest pain, dyspnea on exertion, palpitations and syncope.  Vitals:   12/09/22 1139  BP: 123/71  Pulse: 94  Resp: 16  SpO2: 98%   Body mass index is 41.61 kg/m. Filed Weights   12/09/22 1139  Weight: 290 lb (131.5 kg)     Objective:   Physical  Exam Vitals and nursing note reviewed.  Constitutional:      General: He is not in acute distress. Neck:     Vascular: No JVD.  Cardiovascular:     Rate and Rhythm: Normal rate and regular rhythm.     Heart sounds: Normal heart sounds. No murmur heard. Pulmonary:     Effort: Pulmonary effort is normal.     Breath sounds: Normal breath sounds. No wheezing or rales.  Abdominal:     Palpations: There is no mass.  Musculoskeletal:     Right lower leg: Edema (1+) present.     Left lower leg: Edema (1+) present.  Skin:    Comments: Discoloration and scarring bilateral legs medial aspect, stasis dermatitis          Assessment & Recommendations:   67 y.o. African American male with hypertension, hyperlipidemia, uncontrolled type 2 DM, HFrEF   HFrEF: EF 45% in 2022, now 35-40% (07/2022). Unlikely to be ischemic cardiomyopathy, given no obstructive CAD on cath in 09/2021. Improvement in volume status, still has mild leg edema. Increase Entresto to 49-50 mg twice daily, continue spironolactone 25 mg daily, Jardiance 10 mg daily. Given resting heart rate >90 bpm, increase metoprolol succinate to 50 mg daily, continue: 5 mg twice daily. Some of the resting tachycardia may also be due to caffeine intake.  Strongly encouraged to use CPAP regularly at night, which may reduce his daytime sleepiness, and thereby the caffeine intake. Check BMP and pro BNP in 1 week. Check echocardiogram in 02/2023.  Given absence of CAD, stopped aspirin 81 mg daily.  Continue remote patient monitoring and principal care management for HFrEF with Haynes Dage, RPH   F/u in 4 weeks   Nigel Mormon, MD Pager: [phone] Office: [phone]

## 2022-12-10 ENCOUNTER — Other Ambulatory Visit (HOSPITAL_COMMUNITY): Payer: Self-pay

## 2022-12-11 ENCOUNTER — Other Ambulatory Visit (HOSPITAL_COMMUNITY): Payer: Self-pay

## 2023-01-13 ENCOUNTER — Ambulatory Visit: Payer: Managed Care, Other (non HMO) | Admitting: Cardiology

## 2023-01-18 IMAGING — CT CT CARDIAC CORONARY ARTERY CALCIUM SCORE
3 series · 14 of 20 positions shown, 16 images · non-contrast
Comparison: None.

CLINICAL DATA: 65-year-old African American male with history of
hyperlipidemia, hypertension, diabetes and prior smoking history.

EXAM:
CT CARDIAC CORONARY ARTERY CALCIUM SCORE
TECHNIQUE: Non-contrast imaging through the heart was performed using
prospective ECG gating. Image post processing was performed on an
independent workstation, allowing for quantitative analysis of the
heart and coronary arteries. Note that this exam targets the heart
and the chest was not imaged in its entirety.

[Series 2: calcium scoring 2.00 qr36 bestdiast 72% hrt calciu · axial · 0.37mm/px · z∈[+1802,+1886]mm · 4 of 70 slices shown]
[im 14/70  vessel]
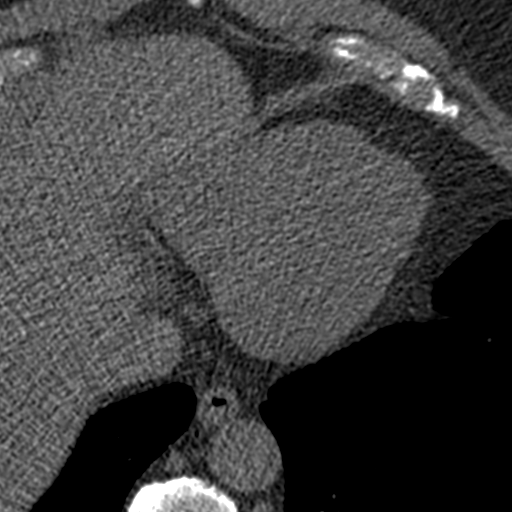
[im 28/70  vessel]
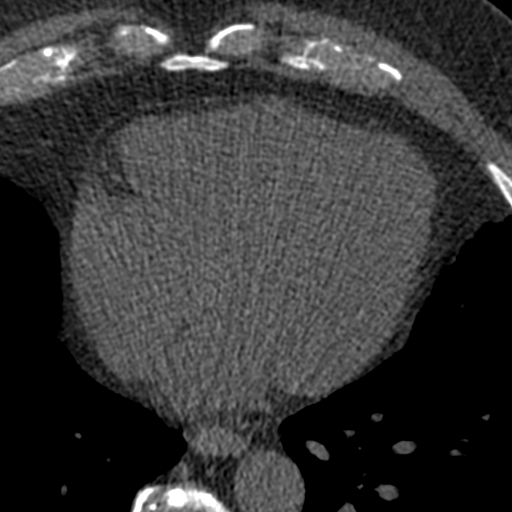
[im 42/70  vessel]
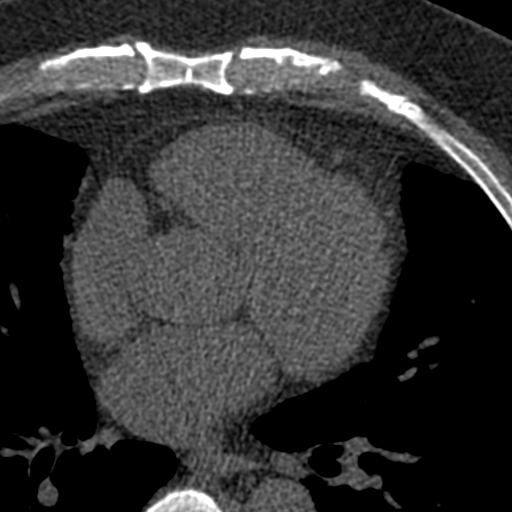
[im 56/70  vessel]
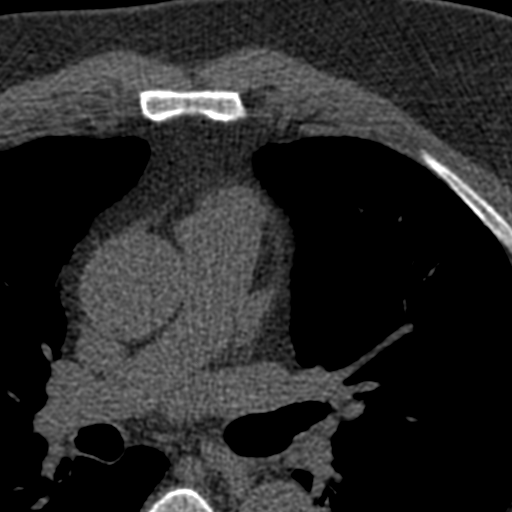

[Series 3: calcium scoring 2.00 br40 bestdiast 72% axial · axial · 0.74mm/px · z∈[+1798,+1890]mm · 5 of 70 slices shown, 7 images]
[im 12/70  vessel]
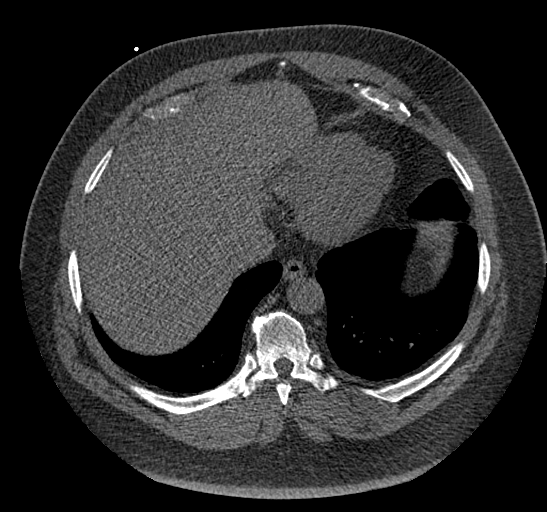
[im 12/70  lung]
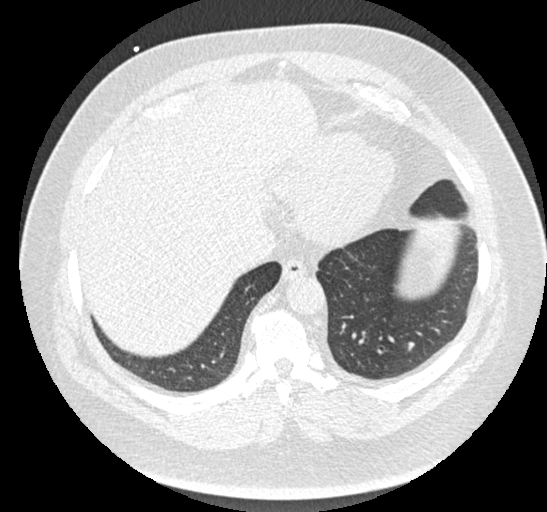
[im 24/70  vessel]
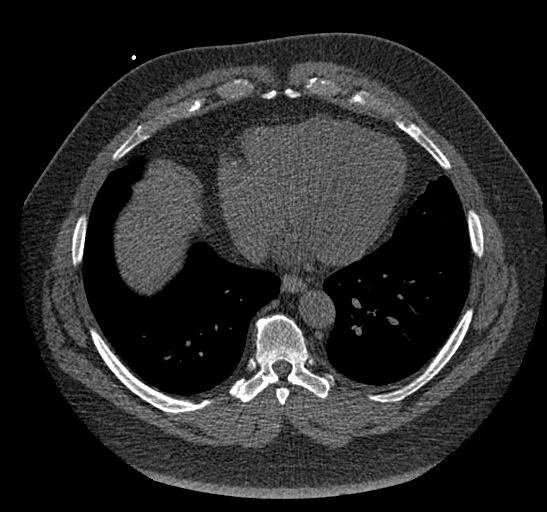
[im 35/70  vessel]
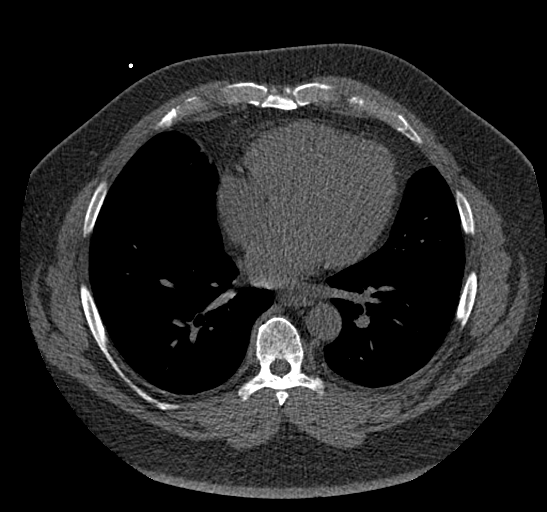
[im 47/70  vessel]
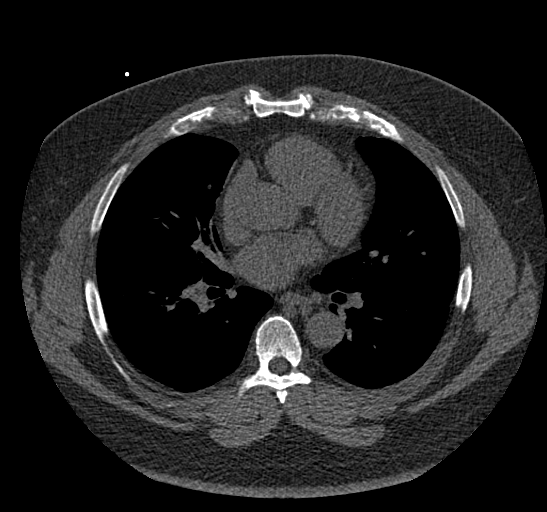
[im 58/70  vessel]
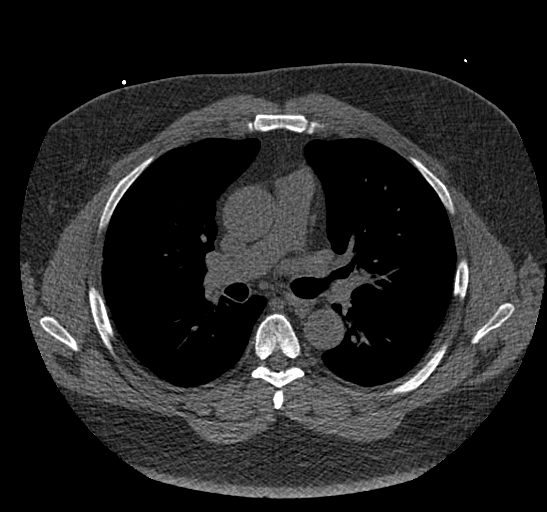
[im 58/70  lung]
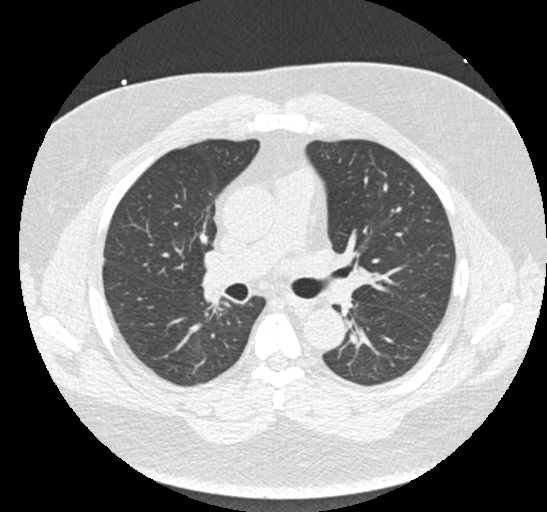

[Series 9: calcium scoring 2.00 br60 bestdiast 72% lungs · axial · 0.74mm/px · z∈[+1798,+1890]mm · 5 of 70 slices shown]
[im 12/70  vessel]
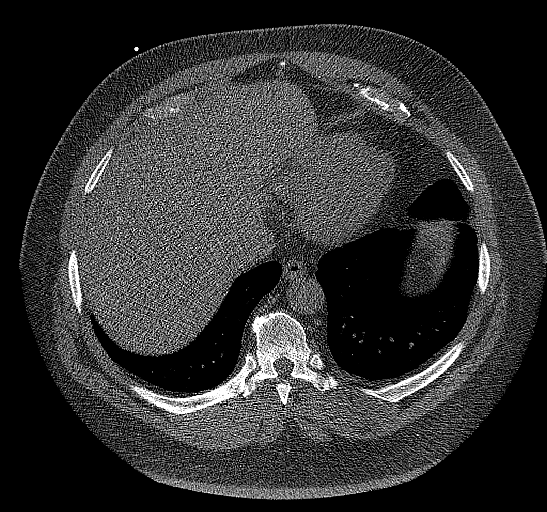
[im 24/70  vessel]
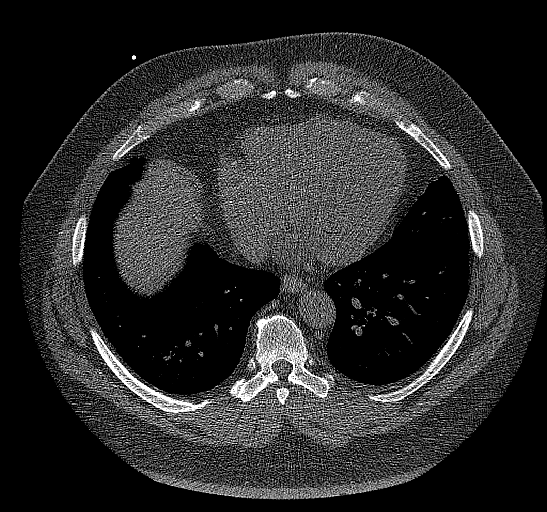
[im 35/70  vessel]
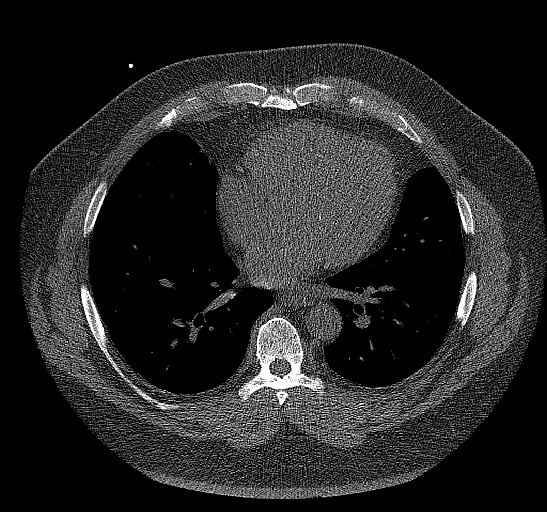
[im 47/70  vessel]
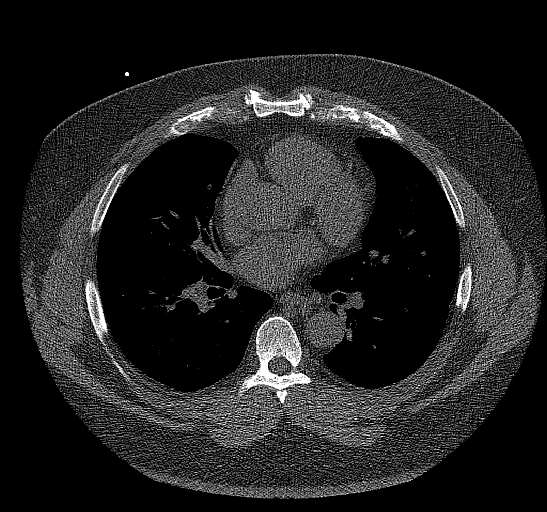
[im 58/70  vessel]
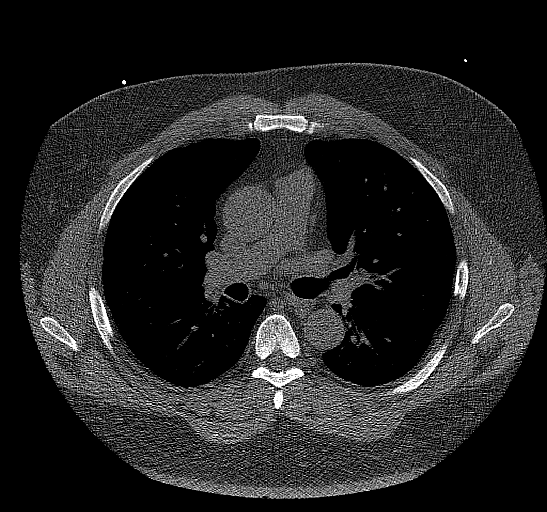

[14 of 20 positions shown; findings below may reference images not displayed]

FINDINGS: CORONARY CALCIUM SCORES:

Left Main: 0

LAD: 0

LCx: 0

RCA: 0

Total Agatston Score: 0

[HOSPITAL] percentile: 0

AORTA MEASUREMENTS:

Ascending Aorta: 36 mm

Descending Aorta: 27 mm

OTHER FINDINGS:


## 2023-01-24 ENCOUNTER — Other Ambulatory Visit: Payer: Self-pay

## 2023-01-24 DIAGNOSIS — I10 Essential (primary) hypertension: Secondary | ICD-10-CM

## 2023-01-24 MED ORDER — BISOPROLOL FUMARATE 5 MG PO TABS: 5.0000 mg | ORAL_TABLET | Freq: Every day | ORAL | 2 refills | Status: DC

## 2023-01-24 NOTE — Progress Notes (Signed)
Per discussion with Dr. Virgina Jock - adding bisprolol 5mg  daily for elevated HR.   Patient taking Corlanor but not metoprolol (lethargy)

## 2023-02-13 ENCOUNTER — Other Ambulatory Visit: Payer: Managed Care, Other (non HMO)

## 2023-03-11 ENCOUNTER — Other Ambulatory Visit: Payer: Self-pay

## 2023-03-11 DIAGNOSIS — I5020 Unspecified systolic (congestive) heart failure: Secondary | ICD-10-CM

## 2023-03-11 MED ORDER — EMPAGLIFLOZIN 10 MG PO TABS: 10.0000 mg | ORAL_TABLET | Freq: Every day | ORAL | 6 refills | Status: DC

## 2023-03-11 MED ORDER — EMPAGLIFLOZIN 10 MG PO TABS: 10.0000 mg | ORAL_TABLET | Freq: Every day | ORAL | 3 refills | Status: AC

## 2023-03-24 ENCOUNTER — Other Ambulatory Visit: Payer: Self-pay

## 2023-03-24 DIAGNOSIS — N529 Male erectile dysfunction, unspecified: Secondary | ICD-10-CM

## 2023-03-24 MED ORDER — SILDENAFIL CITRATE 100 MG PO TABS: 100.0000 mg | ORAL_TABLET | Freq: Every day | ORAL | 0 refills | Status: DC | PRN

## 2023-03-24 NOTE — Telephone Encounter (Signed)
Received refill request fax from [facility] pharmacy for Sildenafil, I called patient and confirmed that he did request this. Advised I was not sure if it would be more expensive or not and to let us know if we need to do anything else for this.

## 2023-03-25 ENCOUNTER — Other Ambulatory Visit: Payer: Self-pay | Admitting: Urology

## 2023-03-25 ENCOUNTER — Telehealth: Payer: Self-pay

## 2023-03-25 ENCOUNTER — Telehealth: Payer: Self-pay | Admitting: Urology

## 2023-03-25 DIAGNOSIS — R3915 Urgency of urination: Secondary | ICD-10-CM

## 2023-03-25 DIAGNOSIS — N529 Male erectile dysfunction, unspecified: Secondary | ICD-10-CM

## 2023-03-25 DIAGNOSIS — R972 Elevated prostate specific antigen [PSA]: Secondary | ICD-10-CM

## 2023-03-25 MED ORDER — MIRABEGRON ER 25 MG PO TB24: 25.0000 mg | ORAL_TABLET | Freq: Every day | ORAL | 3 refills | Status: DC

## 2023-03-25 MED ORDER — SILDENAFIL CITRATE 100 MG PO TABS: 100.0000 mg | ORAL_TABLET | Freq: Every day | ORAL | 0 refills | Status: DC | PRN

## 2023-03-25 MED ORDER — TADALAFIL 20 MG PO TABS: 20.0000 mg | ORAL_TABLET | Freq: Every day | ORAL | 3 refills | Status: DC | PRN

## 2023-03-25 NOTE — Telephone Encounter (Signed)
Received fax from [facility] that Johnny Maddox is not covered under patient's insurance. Other medications that are preferred are Oxybutynin, Vesicare, Tolterodine. Please advise

## 2023-03-25 NOTE — Telephone Encounter (Signed)
[place] with [employer] called and stated that Viagra is not covered. If need to discuss further; her number is [phone] Option 2.

## 2023-03-25 NOTE — Telephone Encounter (Signed)
Spoke with patient. Patient states he received a call from [facility] pharmacist who said with patient's plan Sildenafil is not covered but Tadalafil is on preferred list. Patient states he took Tadalafil in the past 5 mg but did not work the best but wonders if he can try 20 mg dose instead and feels like that would help a lot better. I advised patient that these medications are not usually covered with PA and patient's have to use coupon to pay, I was not sure how it worked with [employer]. Advised patient I would ask Carollee Herter about trying Tadalafil higher dose and I can try PA to see if his plan may cover it.

## 2023-03-25 NOTE — Telephone Encounter (Signed)
He is not taking Ivabradine anymore. He was told by primary care that patient was taking too much medication so he has stopped this medication a while back. He does not remember it helping his ED when he took it. Would like to try Tadalfil the higher dose to [facility]

## 2023-03-25 NOTE — Telephone Encounter (Signed)
Patient advised. Patient does not want to get dry eyes. Myrbetriq shows to be on preferred list of medication if he can try that?

## 2023-03-25 NOTE — Addendum Note (Signed)
Addended by: Consuella Lose on: 03/25/2023 11:58 AM   Modules accepted: Orders

## 2023-03-25 NOTE — Telephone Encounter (Signed)
Due to his work schedule and not having someone else to come by for samples he would like to go ahead and just get a RX to [facility] for Myrbetriq please.

## 2023-03-26 NOTE — Telephone Encounter (Signed)
Working on this. Thank you

## 2023-03-26 NOTE — Telephone Encounter (Signed)
PA faxed to [facility] at [phone]

## 2023-03-26 NOTE — Telephone Encounter (Signed)
Pt said he needs a PA for Tadalafil called in to [facility] [phone].    He also said this number is for any representative from [employer], pt said RX needs PA [phone]

## 2023-03-27 NOTE — Telephone Encounter (Signed)
Patient was advised that I am waiting for insurance to respond

## 2023-03-28 MED ORDER — TADALAFIL 20 MG PO TABS
20.0000 mg | ORAL_TABLET | Freq: Every day | ORAL | 3 refills | Status: DC | PRN
Start: 2023-03-28 — End: 2024-03-24

## 2023-03-28 MED ORDER — TADALAFIL 20 MG PO TABS: 20.0000 mg | ORAL_TABLET | Freq: Every day | ORAL | 3 refills | Status: DC | PRN

## 2023-03-28 NOTE — Addendum Note (Signed)
Addended by: Consuella Lose on: 03/28/2023 02:17 PM   Modules accepted: Orders

## 2023-03-28 NOTE — Telephone Encounter (Signed)
Received information from covermymeds and PA is denied, not covered by his plan. Left detailed message for the patient letting him know this and that this medication would be an out of pocket expense, can use [facility] coupon.

## 2023-03-28 NOTE — Telephone Encounter (Signed)
Patient called back and we discussed the PA been denied. Discussed again using [facility] and RX sent to [facility] pharmacy.

## 2023-05-20 ENCOUNTER — Other Ambulatory Visit: Payer: BC Managed Care – PPO

## 2023-05-20 DIAGNOSIS — C61 Malignant neoplasm of prostate: Secondary | ICD-10-CM

## 2023-05-21 LAB — PSA: Prostate Specific Ag, Serum: <0.1 ng/mL (ref 0.0–4.0)

## 2023-05-27 ENCOUNTER — Ambulatory Visit (INDEPENDENT_AMBULATORY_CARE_PROVIDER_SITE_OTHER): Payer: BC Managed Care – PPO | Admitting: Urology

## 2023-05-27 VITALS — BP 155/94 | HR 88 | Ht 70.0 in | Wt 300.1 lb

## 2023-05-27 DIAGNOSIS — C61 Malignant neoplasm of prostate: Secondary | ICD-10-CM

## 2023-05-27 DIAGNOSIS — N529 Male erectile dysfunction, unspecified: Secondary | ICD-10-CM

## 2023-05-27 DIAGNOSIS — N401 Enlarged prostate with lower urinary tract symptoms: Secondary | ICD-10-CM

## 2023-05-27 DIAGNOSIS — N138 Other obstructive and reflux uropathy: Secondary | ICD-10-CM

## 2023-05-27 NOTE — Progress Notes (Signed)
I,Amy L Pierron,acting as a scribe for Vanna Scotland, MD.,have documented all relevant documentation on the behalf of Vanna Scotland, MD,as directed by  Vanna Scotland, MD while in the presence of Vanna Scotland, MD.  05/27/2023 10:07 AM   Johnny Maddox 03-Jun-1956 [id number]  Referring provider: Alysia Penna, MD [address] [place],  [place] [address]  Chief Complaint Patient presents with  Benign Prostatic Hypertrophy  Prostate Cancer   HPI: 67 year-old male with a personal history of prostate cancer, erectile dysfunction, and BPH returns today for a routine follow-up.   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 He received six months of ADT and underwent brachy seed implantation.  He had irritative urinary symptoms and is chronically on Finasteride.  His most recent PSA as of 05/20/2023 remains undetectable.   He uses Tadalafil as needed for erectile dysfunction.   He reports his urinary symptoms have improved. He still has some nocturia and urgency upon standing. He has not tried [employer]. He has cut back on having energy drinks.   He takes the Tadalafil each day and it hasn't helped much.   IPSS  Row Name 05/27/23 0900 International Prostate Symptom Score How often have you had the sensation of not emptying your bladder? Not at All How often have you had to urinate less than every two hours? Less than half the time How often have you found you stopped and started again several times when you urinated? Not at All How often have you found it difficult to postpone urination? Less than 1 in 5 times How often have you had a weak urinary stream? Not at All How often have you had to strain to start urination? Not at All How many times did you typically get up at night to urinate? 2 Times Total IPSS Score 5 Quality of Life due to urinary  symptoms If you were to spend the rest of your life with your urinary condition just the way it is now how would you feel about that? Terrible  Score:  1-7 Mild 8-19 Moderate 20-35 Severe    PMH: Past Medical History: Diagnosis Date  Acute laryngitis, without mention of obstruction  Allergic rhinitis, cause unspecified  Chalazion  Cough  Dermatophytosis of foot  Diabetes mellitus without complication (HCC)  ED (erectile dysfunction)  Elevated PSA  Hyperlipidemia  Hypertension  Microalbuminuria  Microalbuminuria  Overweight  Prostate cancer (HCC)  Sarcoidosis  Seborrheic dermatitis, unspecified  Sleep apnea  Unspecified disorder of autonomic nervous system  Unspecified sleep apnea  Unspecified venous (peripheral) insufficiency  Unspecified vitamin D deficiency   Surgical History: Past Surgical History: Procedure Laterality Date  CATARACT EXTRACTION Bilateral  CIRCUMCISION  HYDROCELE EXCISION Right 09/23/2016 Procedure: HYDROCELECTOMY ADULT ;  Surgeon: Vanna Scotland, MD;  Location: [facility] ORS;  Service: Urology;  Laterality: Right;  INGUINAL APPROACH  LEFT HEART CATH AND CORONARY ANGIOGRAPHY N/A 10/02/2021 Procedure: LEFT HEART CATH AND CORONARY ANGIOGRAPHY;  Surgeon: Elder Negus, MD;  Location: [facility];  Service: Cardiovascular;  Laterality: N/A;  RADIOACTIVE SEED IMPLANT N/A 01/28/2022 Procedure: RADIOACTIVE SEED IMPLANT/BRACHYTHERAPY IMPLANT;  Surgeon: Vanna Scotland, MD;  Location: [facility] ORS;  Service: Urology;  Laterality: N/A;  88 seeds 28 needles  ROTATOR CUFF REPAIR R 2011, L 2006 L and R shoulder  TONSILLECTOMY AND ADENOIDECTOMY 1964  WISDOM TOOTH EXTRACTION x4   Home Medications:  Allergies as of  05/27/2023     Reactions Other Swelling Mushrooms-lip swelling Lisinopril Cough [place] Nut (berthollefia Excelsa) Rash Allergic to [place] nut   Medication List   Accurate as of May 27, 2023 10:07 AM. If you have any questions, ask your nurse or doctor.   STOP taking these medications   bisoprolol 5 MG tablet Commonly known as: ZEBETA furosemide 20 MG tablet Commonly known as: LASIX mirabegron ER 25 MG Tb24 tablet Commonly known as: MYRBETRIQ spironolactone 25 MG tablet Commonly known as: ALDACTONE   TAKE these medications   atorvastatin 40 MG tablet Commonly known as: LIPITOR Take 40 mg by mouth in the morning. B-D ULTRAFINE III SHORT PEN 31G X 8 MM Misc Generic drug: Insulin Pen Needle Use 4 (four) times daily empagliflozin 10 MG Tabs tablet Commonly known as: Jardiance Take 1 tablet (10 mg total) by mouth daily before breakfast. Entresto 49-51 MG Generic drug: sacubitril-valsartan Take 1 tablet by mouth 2 (two) times daily. finasteride 5 MG tablet Commonly known as: PROSCAR TAKE 1 TABLET BY MOUTH DAILY FreeStyle Libre 14 Day Sensor Misc USE TO TEST BLOOD SUGAR, CHANGING EVERY 14 DAYS FreeStyle Libre Reader Devi 1 Device by Other route 3 (three) times daily. E11.65 HumaLOG KwikPen 100 UNIT/ML KwikPen Generic drug: insulin lispro Inject 18 Units into the skin with breakfast, with lunch, and with evening meal. ivabradine 5 MG Tabs tablet Commonly known as: Corlanor Take 1 tablet (5 mg total) by mouth 2 (two) times daily with a meal. Lantus SoloStar 100 UNIT/ML Solostar Pen Generic drug: insulin glargine Inject 18 Units into the skin at bedtime. metFORMIN 500 MG 24 hr tablet Commonly known as: GLUCOPHAGE-XR Take 1,000 mg by mouth in the morning and at bedtime. multivitamin with minerals Tabs tablet Take 1 tablet by mouth in the morning. Ozempic (1 MG/DOSE) 4 MG/3ML Sopn Generic drug: Semaglutide (1 MG/DOSE) Inject 1 mg into the skin once a week as directed tadalafil 20 MG tablet Commonly known as: CIALIS Take 1 tablet (20 mg total) by mouth daily as needed for erectile dysfunction.    Allergies:   Allergies Allergen Reactions  Other Swelling Mushrooms-lip swelling  Lisinopril Cough  [place] Nut (Berthollefia Excelsa) Rash Allergic to [place] nut   Family History: Family History Problem Relation Age of Onset  Cancer Mother     leukemia  Hypertension Mother  Alcohol abuse Father  Other Father     Father was Murdered @ age 22  Hypertension Brother  Cancer - Lung Brother  Stroke Brother  Prostate cancer Neg Hx  Bladder Cancer Neg Hx  Kidney disease Neg Hx  Kidney cancer Neg Hx   Social History:  reports that he has quit smoking. His smoking use included cigarettes. He has never used smokeless tobacco. He reports current alcohol use of about 2.0 standard drinks of alcohol per week. He reports that he does not use drugs.   Physical Exam: BP (!) 155/94   Pulse 88   Ht 5\' 10"  (1.778 m)   Wt (!) 300 lb 2 oz (136.1 kg)   BMI 43.06 kg/m   Constitutional:  Alert and oriented, No acute distress. HEENT: [place] AT, moist mucus membranes.  Trachea midline, no masses. Neurologic: Grossly intact, no focal deficits, moving all 4 extremities. Psychiatric: Normal mood and affect.   Assessment & Plan:    Prostate cancer  - S/p ADT and brachytherapy. PSA undetectable.   Micah Flesher over prostate will eventually start making normal prostate cells and explained Nadir. Plan to check PSA  every 6 months.  2. BPH  - Still taking Finasteride. Symptoms are controlled and not bothersome. Improving.    3. Erectile dysfunction  - Hasn't seen much help with daily Tadalafil. Discussed continuing to take daily along with an "on demand" dose prior to sexual activity consisting of 4 pills 2-3 hours beforehand. Encouraged to take on an empty stomach.  - Reviewed other options including injections, penis pump, or implant if the medication continues to fail. He will let us know if the new dosing helps.    Follow-up in 1 year with a PA for PSA, IPSS, and PVR.  I have  reviewed the above documentation for accuracy and completeness, and I agree with the above.   Vanna Scotland, MD    [facility] [address], [address] [place], [place] [address] [phone]

## 2023-05-27 NOTE — Progress Notes (Signed)
Johnny Maddox presents for an office visit. BP today is __155/94_________. He is complaint with BP medication. Greater than 140/90. Provider  notified. Pt advised to___f/u with PCP___________. Pt voiced understanding.

## 2023-06-02 ENCOUNTER — Inpatient Hospital Stay: Payer: Managed Care, Other (non HMO) | Attending: Radiation Oncology

## 2023-06-09 ENCOUNTER — Ambulatory Visit
Admission: RE | Admit: 2023-06-09 | Discharge: 2023-06-09 | Disposition: A | Discharge status: Home / Self Care | Payer: BC Managed Care – PPO | Source: Ambulatory Visit | Attending: Radiation Oncology | Admitting: Radiation Oncology

## 2023-06-09 ENCOUNTER — Encounter: Payer: Self-pay | Admitting: Radiation Oncology

## 2023-06-09 VITALS — BP 141/85 | HR 92 | Temp 97.5°F | Resp 16 | Wt 299.7 lb

## 2023-06-09 DIAGNOSIS — C61 Malignant neoplasm of prostate: Secondary | ICD-10-CM | POA: Insufficient documentation

## 2023-06-09 DIAGNOSIS — Z923 Personal history of irradiation: Secondary | ICD-10-CM | POA: Insufficient documentation

## 2023-06-09 NOTE — Progress Notes (Signed)
Radiation Oncology Follow up Note  Name: Johnny Maddox   Date:   06/09/2023 MRN:  [id number] DOB: 05-12-1956    This 67 y.o. male presents to the clinic today for 3-month follow-up status post I-125 interstitial implant for Gleason 7 (4+3) adenocarcinoma presented with a PSA of 7.7.  REFERRING PROVIDER: Alysia Penna, MD  HPI: Patient is a 67 year old male now out 60 months having completed I-125 interstitial implant for Gleason 7 adenocarcinoma presenting with a PSA of 7.7.  Seen today in routine follow-up he is doing well.  He specifically denies any increased lower urinary tract symptoms diarrhea or fatigue his most recent PSA is less than 0.01.Marland Kitchen  COMPLICATIONS OF TREATMENT: none  FOLLOW UP COMPLIANCE: keeps appointments   PHYSICAL EXAM:  BP (!) 141/85   Pulse 92   Temp (!) 97.5 F (36.4 C) (Tympanic)   Resp 16   Wt 299 lb 11.2 oz (135.9 kg)   BMI 43.00 kg/m  Well-developed well-nourished patient in NAD. HEENT reveals PERLA, EOMI, discs not visualized.  Oral cavity is clear. No oral mucosal lesions are identified. Neck is clear without evidence of cervical or supraclavicular adenopathy. Lungs are clear to A&P. Cardiac examination is essentially unremarkable with regular rate and rhythm without murmur rub or thrill. Abdomen is benign with no organomegaly or masses noted. Motor sensory and DTR levels are equal and symmetric in the upper and lower extremities. Cranial nerves II through XII are grossly intact. Proprioception is intact. No peripheral adenopathy or edema is identified. No motor or sensory levels are noted. Crude visual fields are within normal range.  RADIOLOGY RESULTS: No current films for review  PLAN: Present time patient is now at 16 months from I-125 interstitial implant under excellent biochemical control.  I am pleased with his overall progress.  Of asked to see him back in 1 year for follow-up.  He also continues close follow-up care with urology.  Patient is to  call with any concerns.  I would like to take this opportunity to thank you for allowing me to participate in the care of your patient.Carmina Miller, MD

## 2023-06-11 ENCOUNTER — Emergency Department (HOSPITAL_COMMUNITY): Payer: Medicare Other

## 2023-06-11 ENCOUNTER — Other Ambulatory Visit: Payer: Self-pay

## 2023-06-11 ENCOUNTER — Emergency Department (HOSPITAL_COMMUNITY)
Admission: EM | Admit: 2023-06-11 | Discharge: 2023-06-11 | Disposition: A | Discharge status: Home / Self Care | Payer: Medicare Other | Attending: Emergency Medicine | Admitting: Emergency Medicine

## 2023-06-11 DIAGNOSIS — E119 Type 2 diabetes mellitus without complications: Secondary | ICD-10-CM | POA: Insufficient documentation

## 2023-06-11 DIAGNOSIS — R55 Syncope and collapse: Secondary | ICD-10-CM | POA: Diagnosis present

## 2023-06-11 DIAGNOSIS — Z7984 Long term (current) use of oral hypoglycemic drugs: Secondary | ICD-10-CM | POA: Diagnosis not present

## 2023-06-11 DIAGNOSIS — Z794 Long term (current) use of insulin: Secondary | ICD-10-CM | POA: Insufficient documentation

## 2023-06-11 DIAGNOSIS — I10 Essential (primary) hypertension: Secondary | ICD-10-CM | POA: Insufficient documentation

## 2023-06-11 DIAGNOSIS — Z79899 Other long term (current) drug therapy: Secondary | ICD-10-CM | POA: Diagnosis not present

## 2023-06-11 LAB — BASIC METABOLIC PANEL
Anion gap: 11 (ref 5–15)
BUN: 17 mg/dL (ref 8–23)
CO2: 22 mmol/L (ref 22–32)
Calcium: 9.4 mg/dL (ref 8.9–10.3)
Chloride: 105 mmol/L (ref 98–111)
Creatinine, Ser: 1.1 mg/dL (ref 0.61–1.24)
GFR, Estimated: >60 mL/min (ref >60)
Glucose, Bld: 130 mg/dL — ABNORMAL HIGH (ref 70–99)
Potassium: 4.2 mmol/L (ref 3.5–5.1)
Sodium: 138 mmol/L (ref 135–145)

## 2023-06-11 LAB — CBC WITH DIFFERENTIAL/PLATELET
Abs Immature Granulocytes: 0.02 10*3/uL (ref 0.00–0.07)
Basophils Absolute: 0 10*3/uL (ref 0.0–0.1)
Basophils Relative: 0 %
Eosinophils Absolute: 0.2 10*3/uL (ref 0.0–0.5)
Eosinophils Relative: 5 %
HCT: 43.2 % (ref 39.0–52.0)
Hemoglobin: 13.4 g/dL (ref 13.0–17.0)
Immature Granulocytes: 1 %
Lymphocytes Relative: 18 %
Lymphs Abs: 0.6 10*3/uL — ABNORMAL LOW (ref 0.7–4.0)
MCH: 26.1 pg (ref 26.0–34.0)
MCHC: 31 g/dL (ref 30.0–36.0)
MCV: 84 fL (ref 80.0–100.0)
Monocytes Absolute: 0.3 10*3/uL (ref 0.1–1.0)
Monocytes Relative: 10 %
Neutro Abs: 2.4 10*3/uL (ref 1.7–7.7)
Neutrophils Relative %: 66 %
Platelets: 142 10*3/uL — ABNORMAL LOW (ref 150–400)
RBC: 5.14 MIL/uL (ref 4.22–5.81)
RDW: 14.6 % (ref 11.5–15.5)
WBC: 3.6 10*3/uL — ABNORMAL LOW (ref 4.0–10.5)
nRBC: 0 % (ref 0.0–0.2)

## 2023-06-11 LAB — TROPONIN I (HIGH SENSITIVITY)
Troponin I (High Sensitivity): 14 ng/L (ref <18)
Troponin I (High Sensitivity): 12 ng/L (ref <18)

## 2023-06-11 NOTE — ED Triage Notes (Signed)
Pt to the ed from work via ems with a CC of syncope while in a work meeting. Per ems pt was diaphoretic and weak after episode. Pt was hypertensive at 210/100. Pt did not take bp meds this morning.  Pt denies cp, relays some dizziness, foggy.

## 2023-06-11 NOTE — Discharge Instructions (Addendum)
You were seen in the ER for an episode of syncope or near syncope, which means passing out or getting close to losing consciousness.  Your workup in the ER was normal.  We did not see signs of any dangerous heart rhythms on your monitor, infection, anemia, heart attack, or other life-threatening causes for this condition.  It is not clear what caused this episode.  We talked about the option of staying in the hospital under observation versus going home, and he preferred to go home.  You told me you have a family member that you can stay with for the next 24 hours to help keep an eye on you.  I would recommend that you stay hydrated, that you rest, and that you follow-up with your cardiologist at the number above.  If you begin to feel dizzy, lightheaded, or have chest pain or difficulty breathing, please call 911 or return immediately to the hospital

## 2023-06-11 NOTE — ED Provider Notes (Signed)
[place] EMERGENCY DEPARTMENT AT [facility] Provider Note   CSN: [id number] Arrival date & time: 06/11/23  0908     History  Chief Complaint  Patient presents with   Near Syncope    Johnny Maddox is a 67 y.o. male w/ hx of obesity, diabetes, HTN, presenting to ED with syncope.  Patient reports he was at a work meeting this morning, sitting down, began to feel lightheaded, and when he stood up he had LOC.  EMS called on scene.  BP reported 210/100 mmhg for EMS.    Patient reports he did not eat breakfast today.  He reports prior hx of syncope, including 1 year ago, prompting cardiology referral and echocardiogram.  Echo Report 07/17/22 Dr Rosemary Holms:  Echocardiogram 07/17/2022:  Left ventricle cavity is normal in size. Mild concentric hypertrophy of  the left ventricle. Moderate global hypokinesis. LVEF 35-40%. Doppler  evidence of grade I (impaired) diastolic dysfunction, normal LAP.  Structurally normal trileaflet aortic valve.  Mild (Grade I) aortic  regurgitation.  Mild (Grade I) mitral regurgitation.  Normal right atrial pressure.  Compared to previous study on 08/29/2021, LVEF has reduced from 45-50%.   LHC 10/02/21 with: LM: Normal LAD: Mid 30% stenosis Lcx: Normal RCA: Normal   LVEDP 25 mmHg  HPI     Home Medications Prior to Admission medications   Medication Sig Start Date End Date Taking? Authorizing Provider  atorvastatin (LIPITOR) 40 MG tablet Take 40 mg by mouth in the morning. 11/22/21   [provider]  Continuous Blood Gluc Receiver (FREESTYLE LIBRE READER) DEVI 1 Device by Other route 3 (three) times daily. E11.65 10/08/19   Doristine Bosworth, MD  Continuous Blood Gluc Sensor (FREESTYLE LIBRE 14 DAY SENSOR) MISC USE TO TEST BLOOD SUGAR, CHANGING EVERY 14 DAYS 06/01/18   [provider]  empagliflozin (JARDIANCE) 10 MG TABS tablet Take 1 tablet (10 mg total) by mouth daily before breakfast. 03/11/23   Patwardhan, Manish J, MD   finasteride (PROSCAR) 5 MG tablet TAKE 1 TABLET BY MOUTH DAILY 12/05/22   McGowan, Wellington Hampshire, PA-C  HUMALOG KWIKPEN 100 UNIT/ML KiwkPen Inject 18 Units into the skin with breakfast, with lunch, and with evening meal. 06/05/16   [provider]  Insulin Glargine (LANTUS SOLOSTAR) 100 UNIT/ML Solostar Pen Inject 18 Units into the skin at bedtime. 09/29/18   [provider]  Insulin Pen Needle (B-D ULTRAFINE III SHORT PEN) 31G X 8 MM MISC Use 4 (four) times daily 01/13/19   [provider]  ivabradine (CORLANOR) 5 MG TABS tablet Take 1 tablet (5 mg total) by mouth 2 (two) times daily with a meal. 09/18/22   Patwardhan, Manish J, MD  metFORMIN (GLUCOPHAGE-XR) 500 MG 24 hr tablet Take 1,000 mg by mouth in the morning and at bedtime. 07/03/18   [provider]  Multiple Vitamin (MULTIVITAMIN WITH MINERALS) TABS tablet Take 1 tablet by mouth in the morning.    [provider]  sacubitril-valsartan (ENTRESTO) 49-51 MG Take 1 tablet by mouth 2 (two) times daily. 12/09/22   Patwardhan, Anabel Bene, MD  Semaglutide, 1 MG/DOSE, (OZEMPIC, 1 MG/DOSE,) 4 MG/3ML SOPN Inject 1 mg into the skin once a week as directed 12/04/22     tadalafil (CIALIS) 20 MG tablet Take 1 tablet (20 mg total) by mouth daily as needed for erectile dysfunction. 03/28/23   Michiel Cowboy A, PA-C      Allergies    Other, Lisinopril, and [place] nut (berthollefia excelsa)  Review of Systems   Review of Systems  Physical Exam Updated Vital Signs BP 132/76 (BP Location: Right Arm)   Pulse 74   Temp 97.9 F (36.6 C)   Resp 19   Ht 5\' 10"  (1.778 m)   Wt 135 kg   SpO2 98%   BMI 42.70 kg/m  Physical Exam Constitutional:      General: He is not in acute distress.    Appearance: He is obese.  HENT:     Head: Normocephalic and atraumatic.  Eyes:     Conjunctiva/sclera: Conjunctivae normal.     Pupils: Pupils are equal, round, and reactive to light.  Cardiovascular:     Rate and Rhythm:  Normal rate and regular rhythm.  Pulmonary:     Effort: Pulmonary effort is normal. No respiratory distress.  Abdominal:     General: There is no distension.     Tenderness: There is no abdominal tenderness.  Skin:    General: Skin is warm and dry.  Neurological:     General: No focal deficit present.     Mental Status: He is alert. Mental status is at baseline.  Psychiatric:        Mood and Affect: Mood normal.        Behavior: Behavior normal.     ED Results / Procedures / Treatments   Labs (all labs ordered are listed, but only abnormal results are displayed) Labs Reviewed  BASIC METABOLIC PANEL - Abnormal; Notable for the following components:      Result Value   Glucose, Bld 130 (*)    All other components within normal limits  CBC WITH DIFFERENTIAL/PLATELET - Abnormal; Notable for the following components:   WBC 3.6 (*)    Platelets 142 (*)    Lymphs Abs 0.6 (*)    All other components within normal limits  CBC WITH DIFFERENTIAL/PLATELET  CBG MONITORING, ED  TROPONIN I (HIGH SENSITIVITY)  TROPONIN I (HIGH SENSITIVITY)    EKG EKG Interpretation Date/Time:  Wednesday June 11 2023 10:41:10 EDT Ventricular Rate:  82 PR Interval:  202 QRS Duration:  139 QT Interval:  381 QTC Calculation: 445 R Axis:   -59  Text Interpretation: Sinus rhythm RBBB and LAFB Left ventricular hypertrophy Confirmed by Alvester Chou [phone]) on 06/11/2023 11:07:42 AM  Radiology DG Chest 2 View  Result Date: 06/11/2023 CLINICAL DATA:  Syncope EXAM: CHEST - 2 VIEW COMPARISON:  X-ray 05/08/2016 FINDINGS: No consolidation, pneumothorax or effusion. No edema. Normal cardiopericardial silhouette. Overlapping cardiac leads. Degenerative changes seen along the spine IMPRESSION: No acute cardiopulmonary disease Electronically Signed   By: Karen Kays M.D.   On: 06/11/2023 10:40    Procedures Procedures    Medications Ordered in ED Medications - No data to display  ED Course/ Medical  Decision Making/ A&P                                 Medical Decision Making Amount and/or Complexity of Data Reviewed Labs: ordered. Radiology: ordered. ECG/medicine tests: ordered.   This patient presents to the ED with concern for syncope. This involves an extensive number of treatment options, and is a complaint that carries with it a high risk of complications and morbidity.  The differential diagnosis includes arrhythmia vs orthostatic hypotension vs anemia vs ACS vs infection vs anemia vs metabolic derangement vs other  Test considered: Given prior history of similar episodes, lower suspicion for acute  PE or aortic dissection.  No tachycardia or hypoxia as I would expect with large PE causing syncope.  No indication for CT PE at this time.  Doubt CVA/SAH/Stroke.  Co-morbidities that complicate the patient evaluation: HTN, DM2, CV risk factors  Additional history obtained from EMS   External records from outside source obtained and reviewed including cardiology office records as noted above   I ordered and personally interpreted labs.  The pertinent results include: No emergent findings  I ordered imaging studies including dg chest I independently visualized and interpreted imaging which showed no emergent findings I agree with the radiologist interpretation  The patient was maintained on a cardiac monitor.  I personally viewed and interpreted the cardiac monitored which showed an underlying rhythm of: Sinus rhythm with no evidence of arrhythmia  Per my interpretation the patient's ECG shows normal sinus rhythm no acute ischemic findings  I have reviewed the patients home medicines and have made adjustments as needed  After the interventions noted above, I reevaluated the patient and found that they have: improved   Dispostion:  At this point the patient has been monitored for 5 hours in the ED on telemetry with no arrhythmia.  Vitals have been normal.  He is mentating  well and back to baseline.  We discussed the options of observation in the hospital and telemetry monitoring overnight versus close outpatient follow-up.  He is preferring to go home and has a family member he can stay with today.  I think this is reasonable, given that he has had long-term cardiac monitoring and recent echocardiogram within the past year, neither of which showed any significant emergent findings.  I have a low suspicion for sepsis, acute PE.  His glucose is within normal limits.  Close return precautions were discussed.  After consideration of the diagnostic results and the patients response to treatment, I feel that the patent would benefit from close outpatient follow-up.         Final Clinical Impression(s) / ED Diagnoses Final diagnoses:  Near syncope    Rx / DC Orders ED Discharge Orders     None         Oprah Camarena, Kermit Balo, MD 06/11/23 1429

## 2023-06-16 ENCOUNTER — Ambulatory Visit: Payer: BC Managed Care – PPO | Admitting: Podiatry

## 2023-07-15 ENCOUNTER — Encounter: Payer: Self-pay | Admitting: Cardiology

## 2023-07-15 ENCOUNTER — Ambulatory Visit: Payer: Medicare Other | Admitting: Cardiology

## 2023-07-15 VITALS — BP 143/79 | HR 117 | Resp 12 | Ht 70.0 in | Wt 302.0 lb

## 2023-07-15 DIAGNOSIS — E1165 Type 2 diabetes mellitus with hyperglycemia: Secondary | ICD-10-CM

## 2023-07-15 DIAGNOSIS — I10 Essential (primary) hypertension: Secondary | ICD-10-CM

## 2023-07-15 DIAGNOSIS — I5020 Unspecified systolic (congestive) heart failure: Secondary | ICD-10-CM

## 2023-07-15 MED ORDER — FUROSEMIDE 40 MG PO TABS: 40.0000 mg | ORAL_TABLET | Freq: Every day | ORAL | 3 refills | Status: DC

## 2023-07-15 NOTE — Progress Notes (Signed)
Patient referred by Alysia Penna, MD for syncoope  Subjective:   Johnny Maddox, male    DOB: 1956-09-23, 67 y.o.   MRN: [id number]   Chief Complaint  Patient presents with   Primary hypertension   HFrEF (heart failure with reduced ejection fraction)    Follow-up    4 weeks     HPI  67 y.o. African American male with hypertension, hyperlipidemia, uncontrolled type 2 DM, HFrEF  Patient has recently had worsening leg swelling in both legs.  He denies any worse than usual exertional dyspnea.  Also denies any chest pain.  He has been compliant with his medical therapy.  In the past, he has not tolerated various beta-blockers due to side effects     Current Outpatient Medications:    atorvastatin (LIPITOR) 40 MG tablet, Take 40 mg by mouth in the morning., Disp: , Rfl:    Continuous Blood Gluc Receiver (FREESTYLE LIBRE READER) DEVI, 1 Device by Other route 3 (three) times daily. E11.65, Disp: 1 each, Rfl: 0   Continuous Blood Gluc Sensor (FREESTYLE LIBRE 14 DAY SENSOR) MISC, USE TO TEST BLOOD SUGAR, CHANGING EVERY 14 DAYS, Disp: , Rfl: 12   empagliflozin (JARDIANCE) 10 MG TABS tablet, Take 1 tablet (10 mg total) by mouth daily before breakfast., Disp: 90 tablet, Rfl: 3   finasteride (PROSCAR) 5 MG tablet, TAKE 1 TABLET BY MOUTH DAILY, Disp: 90 tablet, Rfl: 3   HUMALOG KWIKPEN 100 UNIT/ML KiwkPen, Inject 18 Units into the skin with breakfast, with lunch, and with evening meal., Disp: , Rfl: 12   Insulin Glargine (LANTUS SOLOSTAR) 100 UNIT/ML Solostar Pen, Inject 18 Units into the skin at bedtime., Disp: , Rfl:    Insulin Pen Needle (B-D ULTRAFINE III SHORT PEN) 31G X 8 MM MISC, Use 4 (four) times daily, Disp: , Rfl:    ivabradine (CORLANOR) 5 MG TABS tablet, Take 1 tablet (5 mg total) by mouth 2 (two) times daily with a meal., Disp: 60 tablet, Rfl: 2   metFORMIN (GLUCOPHAGE-XR) 500 MG 24 hr tablet, Take 1,000 mg by mouth in the morning and at bedtime., Disp: , Rfl: 5   Multiple  Vitamin (MULTIVITAMIN WITH MINERALS) TABS tablet, Take 1 tablet by mouth in the morning., Disp: , Rfl:    sacubitril-valsartan (ENTRESTO) 49-51 MG, Take 1 tablet by mouth 2 (two) times daily., Disp: 60 tablet, Rfl: 3   Semaglutide, 1 MG/DOSE, (OZEMPIC, 1 MG/DOSE,) 4 MG/3ML SOPN, Inject 1 mg into the skin once a week as directed, Disp: 3 mL, Rfl: 3   tadalafil (CIALIS) 20 MG tablet, Take 1 tablet (20 mg total) by mouth daily as needed for erectile dysfunction., Disp: 90 tablet, Rfl: 3  Cardiovascular and other pertinent studies:  EKG 06/11/2023: Sinus rhythm 82 bpm RBBB, LAFB LVH  Echocardiogram 07/17/2022:  Left ventricle cavity is normal in size. Mild concentric hypertrophy of  the left ventricle. Moderate global hypokinesis. LVEF 35-40%. Doppler  evidence of grade I (impaired) diastolic dysfunction, normal LAP.  Structurally normal trileaflet aortic valve.  Mild (Grade I) aortic  regurgitation.  Mild (Grade I) mitral regurgitation.  Normal right atrial pressure.  Compared to previous study on 08/29/2021, LVEF has reduced from 45-50%.   EKG 06/12/2022: Sinus tachycardia 104 bpm  Right bundle branch block  Left anterior fascicular block Nonspecific T-abnormality  Coronary angiography 09/2021: LM: Normal LAD: Mid 30% stenosis Lcx: Normal RCA: Normal   LVEDP 25 mmHg  Mobile cardiac telemetry 4 days 09/24/2021 - 09/28/2021: Dominant  rhythm: Sinus. HR 65-137 bpm. Avg HR 102 bpm. 0 episodes of SVT. <1% isolated SVE, couplets 0 episodes of VT. <1% isolated VE, couplets No atrial fibrillation/atrial flutter/SVT/VT/high grade AV block, sinus pause >3sec noted. 0 patient triggered events.    Recent labs: 06/11/2023: Glucose 130, BUN/Cr 17/1.10. EGFR >60. Na/K 138/4.2.  H/H 13/42. MCV 84. Platelets 142  01/22/2022: H/H 13/44. MCV 84. Platelets 137  09/27/2021: Glucose 208, BUN/Cr 15/1.05. EGFR 79. Na/K 142/4.5. Rest of the CMP normal  10/2020: HbA1C 11.3%   Review of  Systems  Cardiovascular:  Positive for leg swelling. Negative for chest pain, dyspnea on exertion, palpitations and syncope.         Vitals:   07/15/23 0848  BP: (!) 143/79  Pulse: (!) 117  Resp: 12  SpO2: 97%    Body mass index is 43.33 kg/m. Filed Weights   07/15/23 0848  Weight: (!) 302 lb (137 kg)      Objective:   Physical Exam Vitals and nursing note reviewed.  Constitutional:      General: He is not in acute distress. Neck:     Vascular: No JVD.  Cardiovascular:     Rate and Rhythm: Normal rate and regular rhythm.     Heart sounds: Normal heart sounds. No murmur heard. Pulmonary:     Effort: Pulmonary effort is normal.     Breath sounds: Normal breath sounds. No wheezing or rales.  Abdominal:     Palpations: There is no mass.  Musculoskeletal:     Right lower leg: Edema (1+) present.     Left lower leg: Edema (1+) present.          Assessment & Recommendations:   67 y.o. African American male with hypertension, hyperlipidemia, uncontrolled type 2 DM, HFrEF   HFrEF: EF 45% in 2022, 35-40% in 07/2022. Mild fluid overload on exam bilateral leg edema.   Continue Entresto to 49-51 mg twice daily, spironolactone 50 mg daily, Jardiance 10 mg daily. Added Lasix 40 mg daily. Check CMP, proBNP in 1-2 weeks. Recommend low-salt diet, checking daily weights and blood pressure at home.    F/u in 6 weeks    Elder Negus, MD Pager: [phone] Office: [phone]

## 2023-07-20 ENCOUNTER — Other Ambulatory Visit: Payer: Self-pay | Admitting: Urology

## 2023-07-31 NOTE — Telephone Encounter (Signed)
This encounter was created in error - please disregard.

## 2023-08-12 ENCOUNTER — Other Ambulatory Visit: Payer: Medicare Other

## 2023-08-19 ENCOUNTER — Telehealth (HOSPITAL_COMMUNITY): Payer: Self-pay | Admitting: Cardiology

## 2023-08-19 NOTE — Telephone Encounter (Signed)
Noted ? ?Thanks ?MJP ? ?

## 2023-08-19 NOTE — Telephone Encounter (Signed)
I called to reschedule echocardiogram for patient. Patient states that he lost his job and insurance and does not wish to schedule at this time. Patient will call back when he is able to schedule. Order will be removed from the echo WQ. Thank you.

## 2023-08-20 ENCOUNTER — Ambulatory Visit: Payer: Medicare Other | Admitting: Cardiology

## 2023-09-11 ENCOUNTER — Ambulatory Visit: Payer: Medicare Other | Attending: Cardiology | Admitting: Cardiology

## 2023-09-12 ENCOUNTER — Encounter: Payer: Self-pay | Admitting: Cardiology

## 2023-11-19 ENCOUNTER — Other Ambulatory Visit: Payer: Medicare Other

## 2023-11-21 ENCOUNTER — Encounter: Payer: Self-pay | Admitting: Urology

## 2023-12-23 ENCOUNTER — Ambulatory Visit: Payer: Medicare Other | Admitting: Podiatry

## 2023-12-23 ENCOUNTER — Encounter: Payer: Self-pay | Admitting: Podiatry

## 2023-12-23 VITALS — Ht 70.0 in | Wt 302.0 lb

## 2023-12-23 DIAGNOSIS — M79674 Pain in right toe(s): Secondary | ICD-10-CM

## 2023-12-23 DIAGNOSIS — M79675 Pain in left toe(s): Secondary | ICD-10-CM

## 2023-12-23 DIAGNOSIS — B351 Tinea unguium: Secondary | ICD-10-CM | POA: Diagnosis not present

## 2023-12-23 NOTE — Progress Notes (Signed)
Complaint:  Visit Type: Patient returns to my office for continued preventative foot care services. Complaint: Patient states" my nails have grown long and thick and become painful to walk and wear shoes" Patient has been diagnosed with DM with no foot complications. The patient presents for preventative foot care services. No changes to ROS.  Patient is diabetic with no diabetic complications.  Podiatric Exam: Vascular: dorsalis pedis and posterior tibial pulses are palpable bilateral. Capillary return is immediate. Temperature gradient is WNL. Skin turgor WNL  Sensorium: Normal Semmes Weinstein monofilament test. Normal tactile sensation bilaterally. Nail Exam: Pt has thick disfigured discolored nails with subungual debris noted bilateral entire nail hallux through fifth toenails.  His second toenail right foot is thickand disfigured. Ulcer Exam: There is no evidence of ulcer or pre-ulcerative changes or infection. Orthopedic Exam: Muscle tone and strength are WNL. No limitations in general ROM. No crepitus or effusions noted. Foot type and digits show no abnormalities. Bony prominences are unremarkable. Skin: No Porokeratosis. No infection or ulcers  Diagnosis:  Onychomycosis, , Pain in right toe, pain in left toes  Treatment & Plan Procedures and Treatment: Consent by patient was obtained for treatment procedures.   Debridement of mycotic and hypertrophic toenails, 1 through 5 bilateral and clearing of subungual debris. No ulceration, no infection noted. To schedule nail surgery for removal second toenail right foot. Return Visit-Office Procedure: Patient instructed to return to the office for a follow up visit 4 months for continued evaluation and treatment.      Nicholes Rough D.P.M.

## 2024-01-06 ENCOUNTER — Ambulatory Visit: Payer: Medicare Other | Admitting: Podiatry

## 2024-01-20 ENCOUNTER — Ambulatory Visit: Payer: Medicare Other | Admitting: Podiatry

## 2024-03-15 ENCOUNTER — Ambulatory Visit: Payer: Medicare Other | Admitting: Podiatry

## 2024-03-15 ENCOUNTER — Encounter: Payer: Self-pay | Admitting: Podiatry

## 2024-03-15 DIAGNOSIS — M79675 Pain in left toe(s): Secondary | ICD-10-CM

## 2024-03-15 DIAGNOSIS — B351 Tinea unguium: Secondary | ICD-10-CM

## 2024-03-15 DIAGNOSIS — M79674 Pain in right toe(s): Secondary | ICD-10-CM | POA: Diagnosis not present

## 2024-03-15 DIAGNOSIS — E119 Type 2 diabetes mellitus without complications: Secondary | ICD-10-CM

## 2024-03-18 ENCOUNTER — Ambulatory Visit: Admitting: Podiatry

## 2024-03-20 ENCOUNTER — Encounter: Payer: Self-pay | Admitting: Podiatry

## 2024-03-20 NOTE — Progress Notes (Signed)
 ANNUAL DIABETIC FOOT EXAM  Subjective: Johnny Maddox presents today for annual diabetic foot exam.  Chief Complaint  Patient presents with   Diabetes    "Check my feet.  I want to see about getting this nail taken off, the one next to my big toe on my right foot."  Dr. Sherren Doe - 3 mos. Ago;  A1c - 5.1   Patient confirms h/o diabetes.  Patient denies any h/o foot wounds.  Barnetta Liberty, MD is patient's PCP.  Past Medical History:  Diagnosis Date   Acute laryngitis, without mention of obstruction    Allergic rhinitis, cause unspecified    Chalazion    Cough    Dermatophytosis of foot    Diabetes mellitus without complication (HCC)    ED (erectile dysfunction)    Elevated PSA    Hyperlipidemia    Hypertension    Microalbuminuria    Microalbuminuria    Overweight    Prostate cancer (HCC)    Sarcoidosis    Seborrheic dermatitis, unspecified    Sleep apnea    Unspecified disorder of autonomic nervous system    Unspecified sleep apnea    Unspecified venous (peripheral) insufficiency    Unspecified vitamin D  deficiency    Patient Active Problem List   Diagnosis Date Noted   HFrEF (heart failure with reduced ejection fraction) (HCC) 07/25/2022   Leg edema 06/12/2022   Prostate cancer (HCC) 12/06/2021   Hyperglycemia due to type 2 diabetes mellitus (HCC) 10/31/2021   Hyperlipidemia, unspecified 10/31/2021   Long term (current) use of insulin  (HCC) 10/31/2021   CAD (coronary artery disease) 10/01/2021   Abnormal stress test 10/01/2021   Angina pectoris (HCC) 09/24/2021   Syncope and collapse 06/26/2021   Elevated troponin 06/26/2021   Cough    Adjustment disorder with mixed anxiety and depressed mood 05/24/2021   Complicated grief 05/24/2021   Sarcoidosis 11/12/2018   Diverticulosis of sigmoid colon 12/15/2017   Primary hypertension 09/08/2017   Morbid obesity with BMI of 40.0-44.9, adult (HCC) 08/06/2016   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    Follow-up circumcision 12/11/2015   Balanitis 10/11/2015   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   Avitaminosis D 09/13/2015   History of elevated PSA 06/06/2015   Hydrocele sac 05/08/2015   Impotence 05/08/2015   BPH with obstruction/lower urinary tract symptoms 05/08/2015   ED (erectile dysfunction) of organic origin 07/12/2014   Microalbuminuria 07/10/2014   Dyslipidemia 06/02/2014   Diabetes mellitus, type 2 (HCC) 06/02/2014   Type 2 diabetes mellitus (HCC) 06/02/2014   Cough syncope 06/08/2013   Diabetes mellitus (HCC) 10/24/2012   Chronic venous insufficiency 03/13/2008   OSA (obstructive sleep apnea) 03/13/2008   Past Surgical History:  Procedure Laterality Date   CATARACT EXTRACTION Bilateral    CIRCUMCISION     HYDROCELE EXCISION Right 09/23/2016   Procedure: HYDROCELECTOMY ADULT ;  Surgeon: Dustin Gimenez, MD;  Location: [facility];  Service: Urology;  Laterality: Right;  INGUINAL APPROACH   LEFT HEART CATH AND CORONARY ANGIOGRAPHY N/A 10/02/2021   Procedure: LEFT HEART CATH AND CORONARY ANGIOGRAPHY;  Surgeon: Cody Das, MD;  Location: [facility];  Service: Cardiovascular;  Laterality: N/A;   RADIOACTIVE SEED IMPLANT N/A 01/28/2022   Procedure: RADIOACTIVE SEED IMPLANT/BRACHYTHERAPY IMPLANT;  Surgeon: Dustin Gimenez, MD;  Location: [facility];  Service: Urology;  Laterality: N/A;  88 seeds 28 needles   ROTATOR CUFF REPAIR  R 2011, L 2006   L and R shoulder   TONSILLECTOMY AND  ADENOIDECTOMY  1964   WISDOM TOOTH EXTRACTION     x4   Current Outpatient Medications on File Prior to Visit  Medication Sig Dispense Refill   atorvastatin (LIPITOR) 40 MG tablet Take 40 mg by mouth in the morning.     Continuous Blood Gluc Receiver (FREESTYLE LIBRE READER) DEVI 1 Device by Other route 3 (three) times daily. E11.65 1 each 0   Continuous Blood Gluc Sensor (FREESTYLE LIBRE 14 DAY SENSOR) MISC USE TO TEST BLOOD SUGAR, CHANGING EVERY 14 DAYS  12   empagliflozin   (JARDIANCE ) 10 MG TABS tablet Take 1 tablet (10 mg total) by mouth daily before breakfast. 90 tablet 3   finasteride  (PROSCAR ) 5 MG tablet TAKE 1 TABLET BY MOUTH DAILY 90 tablet 3   furosemide  (LASIX ) 40 MG tablet Take 1 tablet (40 mg total) by mouth daily. 90 tablet 3   HUMALOG KWIKPEN 100 UNIT/ML KiwkPen Inject 20 Units into the skin with breakfast, with lunch, and with evening meal.  12   Insulin  Glargine (LANTUS SOLOSTAR) 100 UNIT/ML Solostar Pen Inject 25 Units into the skin at bedtime.     Insulin  Pen Needle (B-D ULTRAFINE III SHORT PEN) 31G X 8 MM MISC Use 4 (four) times daily     metFORMIN (GLUCOPHAGE-XR) 500 MG 24 hr tablet Take 1,000 mg by mouth in the morning and at bedtime.  5   Multiple Vitamin (MULTIVITAMIN WITH MINERALS) TABS tablet Take 1 tablet by mouth in the morning.     sacubitril-valsartan (ENTRESTO ) 49-51 MG Take 1 tablet by mouth 2 (two) times daily. 60 tablet 3   Semaglutide , 1 MG/DOSE, (OZEMPIC , 1 MG/DOSE,) 4 MG/3ML SOPN Inject 1 mg into the skin once a week as directed 3 mL 3   spironolactone  (ALDACTONE ) 25 MG tablet Take 25 mg by mouth daily.     tadalafil  (CIALIS ) 20 MG tablet Take 1 tablet (20 mg total) by mouth daily as needed for erectile dysfunction. 90 tablet 3   Vibegron  (GEMTESA ) 75 MG TABS Take 1 tablet by mouth daily.     No current facility-administered medications on file prior to visit.    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  Tobacco Use   Smoking status: Former    Types: Cigarettes   Smokeless tobacco: Never   Tobacco comments:    quit over 40 years ago  Vaping Use   Vaping status: Never Used  Substance and Sexual Activity   Alcohol use: Yes    Alcohol/week: 2.0 standard drinks of alcohol    Types: 2 Cans of beer per week     Comment: paranoia about alcholism; alcohol makes me sleepy.1-2 drinks a week   Drug use: No   Sexual activity: Yes    Partners: Female    Birth control/protection: Condom   Family History  Problem Relation Age of Onset   Cancer Mother        leukemia   Hypertension Mother    Alcohol abuse Father    Other Father        Father was Murdered @ age 60   Hypertension Brother    Cancer - Lung Brother    Stroke Brother    Prostate cancer Neg Hx    Bladder Cancer Neg Hx  Kidney disease Neg Hx    Kidney cancer Neg Hx    Immunization History  Administered Date(s) Administered   Influenza Split 08/12/2015, 07/12/2021   Influenza,inj,Quad PF,6+ Mos 08/30/2019   Influenza-Unspecified 08/23/2020   PFIZER(Purple Top)SARS-COV-2 Vaccination 02/09/2021   Pneumococcal Polysaccharide-23 09/19/2008, 05/23/2017   Tdap 09/05/2011, 12/05/2015     Review of Systems: Negative except as noted in the HPI.   Objective: There were no vitals filed for this visit.  Johnny Maddox is a pleasant 68 y.o. male in NAD. AAO X 3.  Diabetic foot exam was performed with the following findings:   Vascular Examination: Capillary refill time immediate b/l. Vascular status intact b/l with palpable pedal pulses. Pedal hair present b/l. No pain with calf compression b/l. Skin temperature gradient WNL b/l. No cyanosis or clubbing b/l. No ischemia or gangrene noted b/l.   Neurological Examination: Sensation grossly intact b/l with 10 gram monofilament. Vibratory sensation intact b/l.   Dermatological Examination: Pedal skin with normal turgor, texture and tone b/l.  No open wounds. No interdigital macerations.   Toenails 1-5 b/l thick, discolored, elongated with subungual debris and pain on dorsal palpation.   No corns, calluses nor porokeratotic lesions noted.  Musculoskeletal Examination: Muscle strength 5/5 to all lower extremity muscle groups bilaterally. No pain, crepitus or joint limitation noted with  ROM bilateral LE. No gross bony deformities bilaterally.  Radiographs: None     Lab Results  Component Value Date   HGBA1C 11.3 (A) 11/06/2020   ADA Risk Categorization: Low Risk :  Patient has all of the following: Intact protective sensation No prior foot ulcer  No severe deformity Pedal pulses present  Assessment: 1. Pain due to onychomycosis of toenails of both feet   2. Type 2 diabetes mellitus without complication, with long-term current use of insulin  (HCC)     Plan: Diabetic foot examination performed today. All patient's and/or POA's questions/concerns addressed on today's visit. Toenails 1-5 debrided in length and girth without incident. Continue foot and shoe inspections daily. Monitor blood glucose per PCP/Endocrinologist's recommendations. Continue soft, supportive shoe gear daily. Report any pedal injuries to medical professional. Call office if there are any questions/concerns. -Patient/POA to call should there be question/concern in the interim. Return in about 3 months (around 06/15/2024).  Luella Sager, DPM      [facility] LOCATION: [address], [place] [address]                   Office [phone]   [facility] LOCATION: [address] [place], [place] [address] Office [phone]

## 2024-03-24 ENCOUNTER — Other Ambulatory Visit: Payer: Self-pay

## 2024-03-24 DIAGNOSIS — N529 Male erectile dysfunction, unspecified: Secondary | ICD-10-CM

## 2024-03-24 MED ORDER — SILDENAFIL CITRATE 100 MG PO TABS: 100.0000 mg | ORAL_TABLET | Freq: Every day | ORAL | 0 refills | Status: DC | PRN

## 2024-03-24 MED ORDER — TADALAFIL 20 MG PO TABS: 20.0000 mg | ORAL_TABLET | Freq: Every day | ORAL | 0 refills | Status: DC | PRN

## 2024-03-25 ENCOUNTER — Ambulatory Visit: Admitting: Podiatry

## 2024-03-25 DIAGNOSIS — L603 Nail dystrophy: Secondary | ICD-10-CM | POA: Diagnosis not present

## 2024-03-25 NOTE — Progress Notes (Signed)
 Subjective:  Patient ID: Johnny Maddox, male    DOB: [birth year],  MRN: [id number]  Chief Complaint  Patient presents with   Nail Problem    Would like to have 2nd toenail removed     68 y.o. male presents with the above complaint.  Patient presents with right second digit nail dystrophy painful to touch is progressive and worse worse with ambulation or shoe pressure patient would like to have it removed has not seen anyone else prior to seeing me for this.  Pain scale 7 out of 10 he is a diabetic with controlled A1c of 6.5.   Review of Systems: Negative except as noted in the HPI. Denies N/V/F/Ch.  Past Medical History:  Diagnosis Date   Acute laryngitis, without mention of obstruction    Allergic rhinitis, cause unspecified    Chalazion    Cough    Dermatophytosis of foot    Diabetes mellitus without complication (HCC)    ED (erectile dysfunction)    Elevated PSA    Hyperlipidemia    Hypertension    Microalbuminuria    Microalbuminuria    Overweight    Prostate cancer (HCC)    Sarcoidosis    Seborrheic dermatitis, unspecified    Sleep apnea    Unspecified disorder of autonomic nervous system    Unspecified sleep apnea    Unspecified venous (peripheral) insufficiency    Unspecified vitamin D  deficiency     Current Outpatient Medications:    atorvastatin (LIPITOR) 40 MG tablet, Take 40 mg by mouth in the morning., Disp: , Rfl:    Continuous Blood Gluc Receiver (FREESTYLE LIBRE READER) DEVI, 1 Device by Other route 3 (three) times daily. E11.65, Disp: 1 each, Rfl: 0   Continuous Blood Gluc Sensor (FREESTYLE LIBRE 14 DAY SENSOR) MISC, USE TO TEST BLOOD SUGAR, CHANGING EVERY 14 DAYS, Disp: , Rfl: 12   empagliflozin  (JARDIANCE ) 10 MG TABS tablet, Take 1 tablet (10 mg total) by mouth daily before breakfast., Disp: 90 tablet, Rfl: 3   finasteride  (PROSCAR ) 5 MG tablet, TAKE 1 TABLET BY MOUTH DAILY, Disp: 90 tablet, Rfl: 3   furosemide  (LASIX ) 40 MG tablet, Take 1 tablet (40 mg  total) by mouth daily., Disp: 90 tablet, Rfl: 3   HUMALOG KWIKPEN 100 UNIT/ML KiwkPen, Inject 20 Units into the skin with breakfast, with lunch, and with evening meal., Disp: , Rfl: 12   Insulin  Glargine (LANTUS SOLOSTAR) 100 UNIT/ML Solostar Pen, Inject 25 Units into the skin at bedtime., Disp: , Rfl:    Insulin  Pen Needle (B-D ULTRAFINE III SHORT PEN) 31G X 8 MM MISC, Use 4 (four) times daily, Disp: , Rfl:    metFORMIN (GLUCOPHAGE-XR) 500 MG 24 hr tablet, Take 1,000 mg by mouth in the morning and at bedtime., Disp: , Rfl: 5   Multiple Vitamin (MULTIVITAMIN WITH MINERALS) TABS tablet, Take 1 tablet by mouth in the morning., Disp: , Rfl:    sacubitril-valsartan (ENTRESTO ) 49-51 MG, Take 1 tablet by mouth 2 (two) times daily., Disp: 60 tablet, Rfl: 3   Semaglutide , 1 MG/DOSE, (OZEMPIC , 1 MG/DOSE,) 4 MG/3ML SOPN, Inject 1 mg into the skin once a week as directed, Disp: 3 mL, Rfl: 3   sildenafil  (VIAGRA ) 100 MG tablet, Take 1 tablet (100 mg total) by mouth daily as needed for erectile dysfunction., Disp: 90 tablet, Rfl: 0   spironolactone  (ALDACTONE ) 25 MG tablet, Take 25 mg by mouth daily., Disp: , Rfl:    tadalafil  (CIALIS ) 20 MG tablet, Take 1  tablet (20 mg total) by mouth daily as needed for erectile dysfunction., Disp: 90 tablet, Rfl: 0   Vibegron  (GEMTESA ) 75 MG TABS, Take 1 tablet by mouth daily., Disp: , Rfl:   Social History   Tobacco Use  Smoking Status Former   Types: Cigarettes  Smokeless Tobacco Never  Tobacco Comments   quit over 40 years ago    Allergies  Allergen Reactions   Other Swelling    Mushrooms-lip swelling   Lisinopril  Cough   [place] Nut (Berthollefia Excelsa) Rash    Allergic to [place] nut   Objective:  There were no vitals filed for this visit. There is no height or weight on file to calculate BMI. Constitutional Well developed. Well nourished.  Vascular Dorsalis pedis pulses palpable bilaterally. Posterior tibial pulses palpable bilaterally. Capillary  refill normal to all digits.  No cyanosis or clubbing noted. Pedal hair growth normal.  Neurologic Normal speech. Oriented to person, place, and time. Epicritic sensation to light touch grossly present bilaterally.  Dermatologic Pain on palpation of the entire/total nail on 2nd digit of the right No other open wounds. No skin lesions.  Orthopedic: Normal joint ROM without pain or crepitus bilaterally. No visible deformities. No bony tenderness.   Radiographs: None Assessment:   1. Nail dystrophy    Plan:  Patient was evaluated and treated and all questions answered.  Nail contusion/dystrophy second digit, right -Patient elects to proceed with minor surgery to remove entire toenail today. Consent reviewed and signed by patient. -Entire/total nail excised. See procedure note. -Educated on post-procedure care including soaking. Written instructions provided and reviewed. -Patient to follow up in 2 weeks for nail check.  Procedure: Excision of entire/total nail with phenol matricectomy Location: Right 2nd toe digit Anesthesia: Lidocaine  1% plain; 1.5 mL and Marcaine  0.5% plain; 1.5 mL, digital block. Skin Prep: Betadine. Dressing: Silvadene; telfa; dry, sterile, compression dressing. Technique: Following skin prep, the toe was exsanguinated and a tourniquet was secured at the base of the toe. The affected nail border was freed and excised.  Phenol was applied in standard technique no complication noted the tourniquet was then removed and sterile dressing applied. Disposition: Patient tolerated procedure well. Patient to return in 2 weeks for follow-up.   No follow-ups on file.

## 2024-03-25 NOTE — Addendum Note (Signed)
 Addended by: Natha Bair on: 03/25/2024 02:04 PM   Modules accepted: Orders

## 2024-03-26 ENCOUNTER — Other Ambulatory Visit: Payer: Self-pay

## 2024-03-26 DIAGNOSIS — N529 Male erectile dysfunction, unspecified: Secondary | ICD-10-CM

## 2024-03-26 MED ORDER — SILDENAFIL CITRATE 100 MG PO TABS: 100.0000 mg | ORAL_TABLET | Freq: Every day | ORAL | 0 refills | Status: DC | PRN

## 2024-03-26 MED ORDER — TADALAFIL 20 MG PO TABS: 20.0000 mg | ORAL_TABLET | Freq: Every day | ORAL | 0 refills | Status: AC | PRN

## 2024-05-21 ENCOUNTER — Other Ambulatory Visit: Payer: Self-pay

## 2024-05-25 ENCOUNTER — Ambulatory Visit: Payer: Self-pay | Admitting: Urology

## 2024-05-26 ENCOUNTER — Other Ambulatory Visit: Payer: Self-pay | Admitting: *Deleted

## 2024-05-26 DIAGNOSIS — C61 Malignant neoplasm of prostate: Secondary | ICD-10-CM

## 2024-05-31 ENCOUNTER — Inpatient Hospital Stay: Payer: Medicare Other | Attending: Radiation Oncology

## 2024-05-31 DIAGNOSIS — C61 Malignant neoplasm of prostate: Secondary | ICD-10-CM | POA: Insufficient documentation

## 2024-06-02 ENCOUNTER — Other Ambulatory Visit: Payer: Self-pay | Admitting: Cardiology

## 2024-06-02 DIAGNOSIS — I5020 Unspecified systolic (congestive) heart failure: Secondary | ICD-10-CM

## 2024-06-03 ENCOUNTER — Inpatient Hospital Stay

## 2024-06-03 DIAGNOSIS — C61 Malignant neoplasm of prostate: Secondary | ICD-10-CM

## 2024-06-03 LAB — PSA: Prostatic Specific Antigen: 0.02 ng/mL (ref 0.00–4.00)

## 2024-06-07 ENCOUNTER — Encounter: Payer: Self-pay | Admitting: Radiation Oncology

## 2024-06-07 ENCOUNTER — Ambulatory Visit
Admission: RE | Admit: 2024-06-07 | Discharge: 2024-06-07 | Disposition: A | Discharge status: Home / Self Care | Payer: Medicare Other | Source: Ambulatory Visit | Attending: Radiation Oncology | Admitting: Radiation Oncology

## 2024-06-07 VITALS — BP 165/95 | HR 97 | Temp 97.5°F | Resp 20 | Wt 304.0 lb

## 2024-06-07 DIAGNOSIS — C61 Malignant neoplasm of prostate: Secondary | ICD-10-CM

## 2024-06-21 ENCOUNTER — Encounter: Payer: Self-pay | Admitting: Podiatry

## 2024-06-21 ENCOUNTER — Ambulatory Visit: Admitting: Podiatry

## 2024-06-21 DIAGNOSIS — Z794 Long term (current) use of insulin: Secondary | ICD-10-CM | POA: Diagnosis not present

## 2024-06-21 DIAGNOSIS — M79674 Pain in right toe(s): Secondary | ICD-10-CM | POA: Diagnosis not present

## 2024-06-21 DIAGNOSIS — M79675 Pain in left toe(s): Secondary | ICD-10-CM

## 2024-06-21 DIAGNOSIS — B351 Tinea unguium: Secondary | ICD-10-CM | POA: Diagnosis not present

## 2024-06-21 DIAGNOSIS — E119 Type 2 diabetes mellitus without complications: Secondary | ICD-10-CM | POA: Diagnosis not present

## 2024-06-21 NOTE — Progress Notes (Signed)
  Subjective:  Patient ID: Johnny Maddox, male    DOB: 29-Mar-1956,  MRN: [id number]  68 y.o. male presents to clinic with  preventative diabetic foot care for painful mycotic toenails of both feet that are difficult to trim. Pain interferes with daily activities and wearing enclosed shoe gear comfortably.  Chief Complaint  Patient presents with   dfc    RM3 Diabetic foot care/ Dr. Larnell last visit March 2025/ A1C 7.1/ Blood sugar 118   New problem(s): None   PCP is Larnell Hamilton, MD.  Allergies  Allergen Reactions   Other Swelling    Mushrooms-lip swelling   Lisinopril  Cough   [place] Nut (Berthollefia Excelsa) Rash    Allergic to [place] nut    Review of Systems: Negative except as noted in the HPI.   Objective:  JEROLD YOSS is a pleasant 68 y.o. male in NAD. AAO x 3.  Vascular Examination: Vascular status intact b/l with palpable pedal pulses. Pedal hair sparse. CFT immediate b/l. No edema. No pain with calf compression b/l. Skin temperature gradient WNL b/l.   Neurological Examination: Sensation grossly intact b/l with 10 gram monofilament. Vibratory sensation intact b/l.   Dermatological Examination: Pedal skin with normal turgor, texture and tone b/l. Toenails 1-5 b/l thick, discolored, elongated with subungual debris and pain on dorsal palpation. No hyperkeratotic lesions noted b/l.   Musculoskeletal Examination: Normal muscle strength 5/5 to all lower extremity muscle groups bilaterally. Pes planus deformity noted bilateral LE.SABRA No pain, crepitus or joint limitation noted with ROM b/l LE.  Patient ambulates independently without assistive aids.  Radiographs: None  Assessment:   1. Pain due to onychomycosis of toenails of both feet   2. Type 2 diabetes mellitus without complication, with long-term current use of insulin  [facility])     Plan:  Patient was evaluated and treated. All patient's and/or POA's questions/concerns addressed on today's visit. Mycotic toenails  1-5 debrided in length and girth without incident.  Continue daily foot inspections and monitor blood glucose per PCP/Endocrinologist's recommendations.Continue soft, supportive shoe gear daily. Report any pedal injuries to medical professional. Call office if there are any quesitons/concerns. -Patient/POA to call should there be question/concern in the interim.  Return in about 3 months (around 09/21/2024).  Delon LITTIE Merlin, DPM      [facility] LOCATION: [address], [place] [address]                   Office [phone]   [facility] LOCATION: [address] [place], [place] [address] Office [phone]

## 2024-06-23 ENCOUNTER — Encounter: Payer: Self-pay | Admitting: Podiatry

## 2024-07-08 ENCOUNTER — Other Ambulatory Visit

## 2024-07-14 NOTE — Progress Notes (Unsigned)
 07/15/2024 9:41 AM   Johnny Maddox 04/06/1956 [id number]  Referring provider: Larnell Hamilton, MD [address] [place],  [place] [address]  Urological history: 1. Intermittent unfavorable risk prostate cancer - PSA (05/2024) 0.02 - s/p I-125 prostate seeds implantation 01/28/2022 - completed ADT (12/2021) - Has follow-up with Dr. Camelia in January    2. ED -Contributing factors of age, BPH, prostate cancer, radioactive seed placement, diabetes, hypertension, hyperlipidemia, coronary artery disease and sleep apnea - sildenafil  100 mg on demand dosing - Tadalafil  20 mg on demand dosing   3. BPH with LU TS - finasteride  5 mg daily   4.  Left testicular mass -scrotal ultrasound 08/2020 Stable 4 mm nodule in the left testicle. This is favored to be benign given its 3 year stability  Chief Complaint  Patient presents with   Follow-up   HPI: Johnny Maddox is a 68 y.o. man who presents today for yearly follow up.    Previous records reviewed.   I PSS 9/5  PSA was 0.02.    He is having issues with frequency during the day going 5+ times daily.  He drinks about 2 L of carbonated water daily and 1 can of diet soda.  He is wearing bladder control underwear in case he has any episodes of urge incontinence.  He is having nocturia x 1-2.  He was prescribed tamsulosin  0.4 mg daily by Dr. Camelia and was on it for 2 to 3 months and did not see any improvement in his symptoms.  He also had been on Gemtesa  in the past and found that was not effective.  He states he drinks a lot of water because he is thirsty all the time.  He also take spironolactone  daily.  He is also having issues with erectile dysfunction.  He states he is not able to achieve an erection.  He is taking tadalafil  5 mg daily.  He did try to augment with 100 mg sildenafil , but he states it was not effective and all he experienced was a headache.   He denies any painful erections.  He denies any spontaneous erections.  He  denies any curvature to erections.  UA with 3+ glucose, 1+ protein, trace ketone and microscopic exam negative.  PVR 3 mL   PMH: Past Medical History:  Diagnosis Date   Acute laryngitis, without mention of obstruction    Allergic rhinitis, cause unspecified    Chalazion    Cough    Dermatophytosis of foot    Diabetes mellitus without complication (HCC)    ED (erectile dysfunction)    Elevated PSA    Hyperlipidemia    Hypertension    Microalbuminuria    Microalbuminuria    Overweight    Prostate cancer (HCC)    Sarcoidosis    Seborrheic dermatitis, unspecified    Sleep apnea    Unspecified disorder of autonomic nervous system    Unspecified sleep apnea    Unspecified venous (peripheral) insufficiency    Unspecified vitamin D  deficiency     Surgical History: Past Surgical History:  Procedure Laterality Date   CATARACT EXTRACTION Bilateral    CIRCUMCISION     HYDROCELE EXCISION Right 09/23/2016   Procedure: HYDROCELECTOMY ADULT ;  Surgeon: Rosina Riis, MD;  Location: [facility] ORS;  Service: Urology;  Laterality: Right;  INGUINAL APPROACH   LEFT HEART CATH AND CORONARY ANGIOGRAPHY N/A 10/02/2021   Procedure: LEFT HEART CATH AND CORONARY ANGIOGRAPHY;  Surgeon: [place] Newman PARAS, MD;  Location: [facility]  INVASIVE CV LAB;  Service: Cardiovascular;  Laterality: N/A;   RADIOACTIVE SEED IMPLANT N/A 01/28/2022   Procedure: RADIOACTIVE SEED IMPLANT/BRACHYTHERAPY IMPLANT;  Surgeon: Penne Knee, MD;  Location: [facility] ORS;  Service: Urology;  Laterality: N/A;  88 seeds 28 needles   ROTATOR CUFF REPAIR  R 2011, L 2006   L and R shoulder   TONSILLECTOMY AND ADENOIDECTOMY  1964   WISDOM TOOTH EXTRACTION     x4    Home Medications:  Allergies as of 07/15/2024       Reactions   Other Swelling   Mushrooms-lip swelling   Lisinopril  Cough   Estonia Nut (berthollefia Excelsa) Rash   Allergic to estonia nut        Medication List        Accurate as of July 15, 2024  9:41 AM. If  you have any questions, ask your nurse or doctor.          STOP taking these medications    Entresto  49-51 MG Generic drug: sacubitril-valsartan Stopped by: Tiari Andringa   sildenafil  100 MG tablet Commonly known as: VIAGRA  Stopped by: Lain Tetterton   spironolactone  25 MG tablet Commonly known as: ALDACTONE  Stopped by: Dijon Kohlman       TAKE these medications    atorvastatin 40 MG tablet Commonly known as: LIPITOR Take 40 mg by mouth in the morning.   B-D ULTRAFINE III SHORT PEN 31G X 8 MM Misc Generic drug: Insulin  Pen Needle Use 4 (four) times daily   empagliflozin  10 MG Tabs tablet Commonly known as: Jardiance  Take 1 tablet (10 mg total) by mouth daily before breakfast.   finasteride  5 MG tablet Commonly known as: PROSCAR  TAKE 1 TABLET BY MOUTH DAILY   FreeStyle Libre 14 Day Sensor Misc USE TO TEST BLOOD SUGAR, CHANGING EVERY 14 DAYS   FreeStyle Libre Reader Devi 1 Device by Other route 3 (three) times daily. E11.65   furosemide  40 MG tablet Commonly known as: LASIX  Take 1 tablet (40 mg total) by mouth daily.   Gemtesa  75 MG Tabs Generic drug: Vibegron  Take 1 tablet (75 mg total) by mouth daily. Started by: CLOTILDA CORNWALL   HumaLOG KwikPen 100 UNIT/ML KwikPen Generic drug: insulin  lispro Inject 20 Units into the skin with breakfast, with lunch, and with evening meal.   Lantus SoloStar 100 UNIT/ML Solostar Pen Generic drug: insulin  glargine Inject 25 Units into the skin at bedtime.   metFORMIN 500 MG 24 hr tablet Commonly known as: GLUCOPHAGE-XR Take 1,000 mg by mouth in the morning and at bedtime.   multivitamin with minerals Tabs tablet Take 1 tablet by mouth in the morning.   Ozempic  (1 MG/DOSE) 4 MG/3ML Sopn Generic drug: Semaglutide  (1 MG/DOSE) Inject 1 mg into the skin once a week as directed   tadalafil  20 MG tablet Commonly known as: CIALIS  Take 1 tablet (20 mg total) by mouth daily as needed for erectile  dysfunction. What changed: Another medication with the same name was added. Make sure you understand how and when to take each. Changed by: CLOTILDA CORNWALL   tadalafil  20 MG tablet Commonly known as: CIALIS  Take 1 tablet (20 mg total) by mouth daily as needed for erectile dysfunction. What changed: You were already taking a medication with the same name, and this prescription was added. Make sure you understand how and when to take each. Changed by: CLOTILDA CORNWALL   tadalafil  5 MG tablet Commonly known as: CIALIS  Take 1 tablet (5 mg total) by mouth daily.  What changed: You were already taking a medication with the same name, and this prescription was added. Make sure you understand how and when to take each. Changed by: CLOTILDA CORNWALL        Allergies:  Allergies  Allergen Reactions   Other Swelling    Mushrooms-lip swelling   Lisinopril  Cough   Estonia Nut (Berthollefia Excelsa) Rash    Allergic to estonia nut    Family History: Family History  Problem Relation Age of Onset   Cancer Mother        leukemia   Hypertension Mother    Alcohol abuse Father    Other Father        Father was Murdered @ age 70   Hypertension Brother    Cancer - Lung Brother    Stroke Brother    Prostate cancer Neg Hx    Bladder Cancer Neg Hx    Kidney disease Neg Hx    Kidney cancer Neg Hx     Social History: See HPI for pertinent social history  ROS: Pertinent ROS in HPI  Physical Exam: BP 131/73   Pulse (!) 105   Ht 5' 10 (1.778 m)   Wt 300 lb (136.1 kg)   SpO2 95%   BMI 43.05 kg/m   Constitutional:  Well nourished. Alert and oriented, No acute distress. HEENT: [place] AT, moist mucus membranes.  Trachea midline Cardiovascular: No clubbing, cyanosis, or edema. Respiratory: Normal respiratory effort, no increased work of breathing. Neurologic: Grossly intact, no focal deficits, moving all 4 extremities. Psychiatric: Normal mood and affect.  Laboratory Data: See EPIC and HPI   I have reviewed the labs.   Pertinent Imaging:  07/15/24 09:07  Scan Result 31ml    Assessment & Plan:    1. Unfavorable intermediate risk prostate cancer - PSA at NADIR - follow up in one year, but  patient would like to recheck PSA in 6 months to keep a closer eye on it   2. ED - continue sildenafil  100 mg, on-demand-dosing or tadalafil  20 mg, on-demand-dosing  3. BPH with LU TS - tamsulosin  0.4 mg daily and finasteride  5 mg daily  4. Frequency - Reviewed factors of the 2 L of carbonated soda, the glucose in his urine and the diuretic contributing to his daytime frequency.   I did not recommend changing or stopping any medications, but did recommend switching to 2 L of just plain water to see if this makes a difference for him.  We also discussed starting an anticholinergic, but I expressed my concerns since he was also experiencing a dry mouth, and anticholinergic will only worsen this condition.  We also discussed PTNS and I will give him a brochure and he may consider that in the future.  He would also like to retry the Gemtesa , so I given him 4 weeks samples.  He will let us  know via MyChart in ~ 30 days if he finds switching to just plain water in the Gemtesa  is helpful with his urinary frequency or if they did not make a difference.  If he finds it did not make a difference, we will see about PTNS and insurance coverage.  Return in about 6 months (around 01/12/2025) for PSA only .  These notes generated with voice recognition software. I apologize for typographical errors.  CLOTILDA CORNWALL RIGGERS  [facility] [address]  [address] [place], [place] [address] [phone]

## 2024-07-15 ENCOUNTER — Encounter: Payer: Self-pay | Admitting: Urology

## 2024-07-15 ENCOUNTER — Ambulatory Visit (INDEPENDENT_AMBULATORY_CARE_PROVIDER_SITE_OTHER): Admitting: Urology

## 2024-07-15 ENCOUNTER — Telehealth: Payer: Self-pay | Admitting: Urology

## 2024-07-15 ENCOUNTER — Other Ambulatory Visit: Payer: Self-pay

## 2024-07-15 VITALS — BP 131/73 | HR 105 | Ht 70.0 in | Wt 300.0 lb

## 2024-07-15 DIAGNOSIS — N5089 Other specified disorders of the male genital organs: Secondary | ICD-10-CM

## 2024-07-15 DIAGNOSIS — N529 Male erectile dysfunction, unspecified: Secondary | ICD-10-CM | POA: Diagnosis not present

## 2024-07-15 DIAGNOSIS — C61 Malignant neoplasm of prostate: Secondary | ICD-10-CM | POA: Diagnosis not present

## 2024-07-15 DIAGNOSIS — R350 Frequency of micturition: Secondary | ICD-10-CM

## 2024-07-15 DIAGNOSIS — N401 Enlarged prostate with lower urinary tract symptoms: Secondary | ICD-10-CM | POA: Diagnosis not present

## 2024-07-15 DIAGNOSIS — N138 Other obstructive and reflux uropathy: Secondary | ICD-10-CM

## 2024-07-15 LAB — URINALYSIS, COMPLETE
Bilirubin, UA: NEGATIVE
Leukocytes,UA: NEGATIVE
Nitrite, UA: NEGATIVE
RBC, UA: NEGATIVE
Specific Gravity, UA: 1.02 (ref 1.005–1.030)
Urobilinogen, Ur: 0.2 mg/dL (ref 0.2–1.0)
pH, UA: 6 (ref 5.0–7.5)

## 2024-07-15 LAB — MICROSCOPIC EXAMINATION: Yeast, UA: NONE SEEN

## 2024-07-15 LAB — BLADDER SCAN AMB NON-IMAGING

## 2024-07-15 MED ORDER — TADALAFIL 20 MG PO TABS: 20.0000 mg | ORAL_TABLET | Freq: Every day | ORAL | 3 refills | Status: AC | PRN

## 2024-07-15 MED ORDER — TADALAFIL 5 MG PO TABS: 5.0000 mg | ORAL_TABLET | Freq: Every day | ORAL | 11 refills | Status: AC

## 2024-07-15 MED ORDER — TADALAFIL 5 MG PO TABS: 5.0000 mg | ORAL_TABLET | Freq: Every day | ORAL | 11 refills | Status: DC

## 2024-07-15 MED ORDER — GEMTESA 75 MG PO TABS
75.0000 mg | ORAL_TABLET | Freq: Every day | ORAL | Status: AC
Start: 2024-07-15 — End: ?

## 2024-07-15 MED ORDER — TADALAFIL 20 MG PO TABS
20.0000 mg | ORAL_TABLET | Freq: Every day | ORAL | 3 refills | Status: DC | PRN
Start: 2024-07-15 — End: 2024-07-15

## 2024-07-15 NOTE — Patient Instructions (Signed)

## 2024-07-15 NOTE — Telephone Encounter (Signed)
 Patient called and stated that [facility] is not covered by his insurance. He would like the 2 Tadalafil  prescriptions (20 mg and 5 mg) sent to [facility]. Their phone # is [phone] and fax # is [phone]. Please advise patient when this is done.

## 2024-08-11 ENCOUNTER — Telehealth: Payer: Self-pay

## 2024-08-11 NOTE — Telephone Encounter (Signed)
 Started Prior Authorization For BPH on cover my meds

## 2024-10-24 ENCOUNTER — Other Ambulatory Visit: Payer: Self-pay | Admitting: Cardiology

## 2024-11-12 ENCOUNTER — Other Ambulatory Visit: Payer: Self-pay

## 2024-11-12 MED ORDER — SILDENAFIL CITRATE 100 MG PO TABS: 100.0000 mg | ORAL_TABLET | Freq: Every day | ORAL | 0 refills | Status: AC | PRN

## 2024-11-15 ENCOUNTER — Other Ambulatory Visit: Payer: Self-pay | Admitting: Cardiology

## 2024-12-16 ENCOUNTER — Ambulatory Visit: Admitting: Podiatry

## 2024-12-16 DIAGNOSIS — B351 Tinea unguium: Secondary | ICD-10-CM

## 2024-12-16 DIAGNOSIS — E119 Type 2 diabetes mellitus without complications: Secondary | ICD-10-CM

## 2024-12-16 DIAGNOSIS — Q666 Other congenital valgus deformities of feet: Secondary | ICD-10-CM

## 2024-12-16 NOTE — Progress Notes (Signed)
 Complaint:  Visit Type: Patient returns to my office for continued preventative foot care services. Complaint: Patient presents thick elongated Shogry mycotic toenails x 10 mild pain on palpation worse with ambulation and shoe pressure patient would like for me to debride out he also has secondary complaint of flatfoot deformity he works all day on his feet.  Just want to get it evaluated.  Denies any other acute complaints  Podiatric Exam: Vascular: dorsalis pedis and posterior tibial pulses are palpable bilateral. Capillary return is immediate. Temperature gradient is WNL. Skin turgor WNL  Sensorium: Normal Semmes Weinstein monofilament test. Normal tactile sensation bilaterally. Nail Exam: Pt has thick disfigured discolored nails with subungual debris noted bilateral entire nail hallux through fifth toenails.  His second toenail right foot is thickand disfigured. Ulcer Exam: There is no evidence of ulcer or pre-ulcerative changes or infection. 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 No infection or ulcers  Diagnosis:  Onychomycosis, , Pain in right toe, pain in left toes Pes planovalgus  Treatment & Plan Procedures and Treatment: Consent by patient was obtained for treatment procedures.   Debridement of mycotic and hypertrophic toenails, 1 through 5 bilateral and clearing of subungual debris. No ulceration, no infection noted. To schedule nail surgery for removal second toenail right foot. Return Visit-Office Procedure: Patient instructed to return to the office for a follow up visit 4 months for continued evaluation and treatment.    Pes planovalgus/foot deformity -I explained to patient the etiology of pes planovalgus and relationship with heel pain/arch pain and various treatment options were discussed.  Given patient foot structure in the  setting of heel pain/arch pain I believe patient will benefit from custom-made orthotics to help control the hindfoot motion support the arch of the foot and take the stress away from arches.  Patient agrees with the plan like to proceed with orthotics -Patient was casted for orthotics    Franky Blanch D.P.M.

## 2025-01-12 ENCOUNTER — Other Ambulatory Visit

## 2025-03-14 ENCOUNTER — Ambulatory Visit: Admitting: Podiatry
